# Patient Record
Sex: Male | Born: 1950
Health system: Southern US, Community
[De-identification: ages and names within clinical notes are randomized; demographics above are authoritative.]

## PROBLEM LIST (undated history)

## (undated) DIAGNOSIS — D123 Benign neoplasm of transverse colon: Secondary | ICD-10-CM

## (undated) DIAGNOSIS — T884XXA Failed or difficult intubation, initial encounter: Secondary | ICD-10-CM

## (undated) DIAGNOSIS — F419 Anxiety disorder, unspecified: Secondary | ICD-10-CM

## (undated) DIAGNOSIS — D125 Benign neoplasm of sigmoid colon: Secondary | ICD-10-CM

## (undated) DIAGNOSIS — D122 Benign neoplasm of ascending colon: Secondary | ICD-10-CM

## (undated) DIAGNOSIS — G250 Essential tremor: Secondary | ICD-10-CM

## (undated) DIAGNOSIS — G3184 Mild cognitive impairment, so stated: Secondary | ICD-10-CM

## (undated) DIAGNOSIS — C61 Malignant neoplasm of prostate: Secondary | ICD-10-CM

## (undated) DIAGNOSIS — B019 Varicella without complication: Secondary | ICD-10-CM

## (undated) DIAGNOSIS — D120 Benign neoplasm of cecum: Secondary | ICD-10-CM

## (undated) DIAGNOSIS — Z86010 Personal history of colonic polyps: Secondary | ICD-10-CM

## (undated) DIAGNOSIS — H269 Unspecified cataract: Secondary | ICD-10-CM

## (undated) HISTORY — PX: POLYPECTOMY: SHX149

## (undated) HISTORY — DX: Benign neoplasm of sigmoid colon: D12.5

## (undated) HISTORY — DX: Benign neoplasm of ascending colon: D12.2

## (undated) HISTORY — DX: Benign neoplasm of cecum: D12.0

## (undated) HISTORY — DX: Essential tremor: G25.0

## (undated) HISTORY — PX: COLONOSCOPY: SHX174

## (undated) HISTORY — DX: Personal history of colonic polyps: Z86.010

## (undated) HISTORY — PX: PROSTATE BIOPSY: SHX241

## (undated) HISTORY — DX: Benign neoplasm of transverse colon: D12.3

## (undated) HISTORY — PX: CLOSED REDUCTION FOREARM FRACTURE: SHX960

## (undated) HISTORY — DX: Unspecified cataract: H26.9

## (undated) HISTORY — DX: Mild cognitive impairment of uncertain or unknown etiology: G31.84

## (undated) HISTORY — DX: Varicella without complication: B01.9

## (undated) HISTORY — DX: Anxiety disorder, unspecified: F41.9

---

## 2001-10-28 ENCOUNTER — Encounter: Admission: RE | Admit: 2001-10-28 | Discharge: 2002-01-26 | Payer: Self-pay | Admitting: Family Medicine

## 2001-11-29 ENCOUNTER — Ambulatory Visit (HOSPITAL_BASED_OUTPATIENT_CLINIC_OR_DEPARTMENT_OTHER): Admission: RE | Admit: 2001-11-29 | Discharge: 2001-11-29 | Payer: Self-pay | Admitting: *Deleted

## 2007-10-08 ENCOUNTER — Ambulatory Visit: Payer: Self-pay | Admitting: Internal Medicine

## 2007-10-17 DIAGNOSIS — Z860100 Personal history of colon polyps, unspecified: Secondary | ICD-10-CM

## 2007-10-17 HISTORY — DX: Personal history of colonic polyps: Z86.010

## 2007-10-17 HISTORY — DX: Personal history of colon polyps, unspecified: Z86.0100

## 2007-10-22 ENCOUNTER — Ambulatory Visit: Payer: Self-pay | Admitting: Internal Medicine

## 2007-10-22 ENCOUNTER — Encounter: Payer: Self-pay | Admitting: Internal Medicine

## 2007-10-22 DIAGNOSIS — Z860100 Personal history of colon polyps, unspecified: Secondary | ICD-10-CM | POA: Insufficient documentation

## 2007-10-22 HISTORY — PX: COLONOSCOPY W/ POLYPECTOMY: SHX1380

## 2010-10-31 ENCOUNTER — Encounter: Payer: Self-pay | Admitting: Internal Medicine

## 2010-11-05 NOTE — Letter (Signed)
Summary: Colonoscopy Letter  [facility]  [address]   [place], [place] [address]   Phone: [phone]  Fax: [phone]      October 31, 2010 MRN: [id number]   [facility] [address] [place], [place]  [address]   Dear Mr. Rineer,   According to your medical record, it is time for you to schedule a Colonoscopy. The American Cancer Society recommends this procedure as a method to detect early colon cancer. Patients with a family history of colon cancer, or a personal history of colon polyps or inflammatory bowel disease are at increased risk.  This letter has been generated based on the recommendations made at the time of your procedure. If you feel that in your particular situation this may no longer apply, please contact our office.  Please call our office at [phone] to schedule this appointment or to update your records at your earliest convenience.  Thank you for cooperating with [place] to provide you with the very best care possible.   Sincerely,   Stan Head, M.D.  [facility] Gastroenterology Division [phone]

## 2010-11-06 ENCOUNTER — Encounter (INDEPENDENT_AMBULATORY_CARE_PROVIDER_SITE_OTHER): Payer: Self-pay | Admitting: *Deleted

## 2010-11-14 NOTE — Letter (Signed)
Summary: Pre Visit Letter Revised  [facility]  [address] [place], [place] [address]   Phone: [phone]  Fax: [phone]        11/06/2010 MRN: [id number] [facility] [address] [place], [place]  [address]             Procedure Date:  12-17-10           Recall Colon---Dr. Leone Payor   Welcome to the Gastroenterology Division at [facility].    You are scheduled to see a nurse for your pre-procedure visit on 12-03-10 at 8:30a.m. on the 3rd floor at [facility], [address]  We ask that you try to arrive at our office 15 minutes prior to your appointment time to allow for check-in.  Please take a minute to review the attached form.  If you answer "Yes" to one or more of the questions on the first page, we ask that you call the person listed at your earliest opportunity.  If you answer "No" to all of the questions, please complete the rest of the form and bring it to your appointment.    Your nurse visit will consist of discussing your medical and surgical history, your immediate family medical history, and your medications.   If you are unable to list all of your medications on the form, please bring the medication bottles to your appointment and we will list them.  We will need to be aware of both prescribed and over the counter drugs.  We will need to know exact dosage information as well.    Please be prepared to read and sign documents such as consent forms, a financial agreement, and acknowledgement forms.  If necessary, and with your consent, a friend or relative is welcome to sit-in on the nurse visit with you.  Please bring your insurance card so that we may make a copy of it.  If your insurance requires a referral to see a specialist, please bring your referral form from your primary care physician.  No co-pay is required for this nurse visit.     If you cannot keep your appointment, please call [phone] to cancel or reschedule prior to  your appointment date.  This allows [place] the opportunity to schedule an appointment for another patient in need of care.    Thank you for choosing [facility] for your medical needs.  We appreciate the opportunity to care for you.  Please visit [place] at our website  to learn more about our practice.  Sincerely, The Gastroenterology Division

## 2010-12-03 ENCOUNTER — Ambulatory Visit (AMBULATORY_SURGERY_CENTER): Payer: 59 | Admitting: *Deleted

## 2010-12-03 VITALS — Ht 70.5 in | Wt 193.0 lb

## 2010-12-03 DIAGNOSIS — Z86010 Personal history of colonic polyps: Secondary | ICD-10-CM

## 2010-12-03 MED ORDER — PEG-KCL-NACL-NASULF-NA ASC-C 100 G PO SOLR: ORAL | Status: DC

## 2010-12-17 ENCOUNTER — Other Ambulatory Visit: Payer: Self-pay | Admitting: Internal Medicine

## 2011-01-30 ENCOUNTER — Encounter: Payer: Self-pay | Admitting: Internal Medicine

## 2011-01-30 ENCOUNTER — Ambulatory Visit (AMBULATORY_SURGERY_CENTER): Payer: 59 | Admitting: Internal Medicine

## 2011-01-30 VITALS — BP 147/82 | HR 91 | Temp 98.0°F | Resp 20 | Ht 70.0 in | Wt 193.0 lb

## 2011-01-30 DIAGNOSIS — F411 Generalized anxiety disorder: Secondary | ICD-10-CM | POA: Insufficient documentation

## 2011-01-30 DIAGNOSIS — Z860100 Personal history of colon polyps, unspecified: Secondary | ICD-10-CM

## 2011-01-30 DIAGNOSIS — D126 Benign neoplasm of colon, unspecified: Secondary | ICD-10-CM

## 2011-01-30 DIAGNOSIS — Z1211 Encounter for screening for malignant neoplasm of colon: Secondary | ICD-10-CM

## 2011-01-30 DIAGNOSIS — F419 Anxiety disorder, unspecified: Secondary | ICD-10-CM

## 2011-01-30 DIAGNOSIS — F410 Panic disorder [episodic paroxysmal anxiety] without agoraphobia: Secondary | ICD-10-CM

## 2011-01-30 HISTORY — DX: Panic disorder (episodic paroxysmal anxiety): F41.0

## 2011-01-30 HISTORY — DX: Panic disorder (episodic paroxysmal anxiety): F41.1

## 2011-01-30 MED ORDER — SODIUM CHLORIDE 0.9 % IV SOLN: 500.0000 mL | INTRAVENOUS | Status: DC

## 2011-01-30 NOTE — Patient Instructions (Addendum)
One polyp was removed from your colon today.  Per Dr. Leone Payor your prostate was mildly enlarged and you should follow up with your primary care physician.  You will receive a letter in the mail in 1-2 weeks to give you pathology results of the polyp removed.  Resume your prior medications today.  Please call if any questions or concerns.

## 2011-01-30 NOTE — Progress Notes (Signed)
No complaints from pt noted in the recovery room.  MAW

## 2011-01-31 ENCOUNTER — Telehealth: Payer: Self-pay | Admitting: *Deleted

## 2011-01-31 NOTE — Telephone Encounter (Signed)
Left a message to call us.

## 2011-02-05 NOTE — Progress Notes (Signed)
Quick Note:  Small adenoma prior adenomas 2009 Routine repeat colonoscopy 01/2016 ______

## 2011-07-06 ENCOUNTER — Ambulatory Visit (HOSPITAL_COMMUNITY)
Admission: RE | Admit: 2011-07-06 | Discharge: 2011-07-06 | Disposition: A | Discharge status: Home / Self Care | Payer: Self-pay | Source: Ambulatory Visit | Attending: Emergency Medicine | Admitting: Emergency Medicine

## 2011-07-06 ENCOUNTER — Other Ambulatory Visit: Payer: Self-pay | Admitting: Emergency Medicine

## 2011-07-06 DIAGNOSIS — W19XXXA Unspecified fall, initial encounter: Secondary | ICD-10-CM

## 2011-07-06 DIAGNOSIS — I2510 Atherosclerotic heart disease of native coronary artery without angina pectoris: Secondary | ICD-10-CM | POA: Insufficient documentation

## 2011-07-06 DIAGNOSIS — R1012 Left upper quadrant pain: Secondary | ICD-10-CM | POA: Insufficient documentation

## 2011-07-06 LAB — CREATININE, SERUM
Creatinine, Ser: 0.83 mg/dL (ref 0.50–1.35)
GFR calc Af Amer: >90 mL/min (ref >90)
GFR calc non Af Amer: >90 mL/min (ref >90)

## 2011-07-06 LAB — BUN: BUN: 15 mg/dL (ref 6–23)

## 2011-07-06 MED ORDER — IOHEXOL 300 MG/ML  SOLN
100.0000 mL | Freq: Once | INTRAMUSCULAR | Status: AC | PRN
  Administered 2011-07-06: 100 mL via INTRAVENOUS

## 2011-10-11 ENCOUNTER — Ambulatory Visit (INDEPENDENT_AMBULATORY_CARE_PROVIDER_SITE_OTHER): Payer: BC Managed Care – PPO | Admitting: Family Medicine

## 2011-10-11 VITALS — BP 136/67 | HR 109 | Temp 98.7°F | Resp 20 | Ht 69.5 in | Wt 201.6 lb

## 2011-10-11 DIAGNOSIS — M79646 Pain in unspecified finger(s): Secondary | ICD-10-CM

## 2011-10-11 DIAGNOSIS — M79609 Pain in unspecified limb: Secondary | ICD-10-CM

## 2011-10-11 DIAGNOSIS — B079 Viral wart, unspecified: Secondary | ICD-10-CM

## 2011-10-11 NOTE — Progress Notes (Signed)
  Subjective:    Patient ID: Russell Sampson, male    DOB: 1950/11/11, 61 y.o.   MRN: [id number]  HPI 61 yo male with left thumb wart for a year.  Many years ago had same and was frozen and cut off.  Has come back.  Gets irritated some times.  Growing.  Also small one over 2nd metacarpal head palmar surface.    Review of Systems Negative except as per HPI     Objective:   Physical Exam  Constitutional: He appears well-developed and well-nourished.  Cardiovascular: Normal rate, normal heart sounds and intact distal pulses.   No murmur heard. Pulmonary/Chest: Effort normal and breath sounds normal.  left thumb with large (.5-.75 cm) irregular verrucous lesion.  ON 2ndMC head, small several mm, flat wart        Assessment & Plan:  Warts - treated per procedure note.

## 2011-10-11 NOTE — Progress Notes (Signed)
   Procedure Note for Cryotherapy:  Verbal consent obtained. Lesion along left thumb paired down with 10 blade. Two applications of cryotherapy applied to lesion. Two applications of cryotherapy also applied to small lesion at the base of the second digit left hand. Lesion dressed. Wound care covered.  RTC in 7-10 days for repeat treatment. Patient tolerated procedure well.  SignedEula Listen, PA-C 10/11/2011 6:36 PM

## 2011-10-19 ENCOUNTER — Ambulatory Visit (INDEPENDENT_AMBULATORY_CARE_PROVIDER_SITE_OTHER): Payer: BC Managed Care – PPO | Admitting: Physician Assistant

## 2011-10-19 DIAGNOSIS — B079 Viral wart, unspecified: Secondary | ICD-10-CM

## 2011-10-19 NOTE — Progress Notes (Signed)
  Subjective:    Patient ID: Russell Sampson, male    DOB: [birth year], 61 y.o.   MRN: [id number]  HPI Here for recheck of warts on Left hand S/P Liquid nitrogen treatment 1 week ago.    Review of Systems negative     Objective:   Physical Exam L hand-thumb with almost 1 cm wart over finger pad, and 5mm wart just proximal to 4th/5th MCP.     Assessment & Plan:  Warts-Left hand Refer to hand/ortho

## 2012-03-01 ENCOUNTER — Encounter: Payer: Self-pay | Admitting: Physician Assistant

## 2012-03-01 ENCOUNTER — Ambulatory Visit: Payer: BC Managed Care – PPO | Admitting: Physician Assistant

## 2012-03-01 VITALS — BP 142/82 | HR 82 | Temp 98.1°F | Resp 16 | Ht 69.25 in | Wt 197.8 lb

## 2012-03-01 DIAGNOSIS — Z0000 Encounter for general adult medical examination without abnormal findings: Secondary | ICD-10-CM

## 2012-03-01 LAB — COMPREHENSIVE METABOLIC PANEL
ALT: 19 U/L (ref 0–53)
AST: 18 U/L (ref 0–37)
Albumin: 4.4 g/dL (ref 3.5–5.2)
Alkaline Phosphatase: 71 U/L (ref 39–117)
BUN: 14 mg/dL (ref 6–23)
CO2: 26 mEq/L (ref 19–32)
Calcium: 9.7 mg/dL (ref 8.4–10.5)
Chloride: 104 mEq/L (ref 96–112)
Creat: 0.8 mg/dL (ref 0.50–1.35)
Glucose, Bld: 80 mg/dL (ref 70–99)
Potassium: 4.1 mEq/L (ref 3.5–5.3)
Sodium: 140 mEq/L (ref 135–145)
Total Bilirubin: 0.9 mg/dL (ref 0.3–1.2)
Total Protein: 6.9 g/dL (ref 6.0–8.3)

## 2012-03-01 LAB — CBC
HCT: 43.2 % (ref 39.0–52.0)
Hemoglobin: 14.9 g/dL (ref 13.0–17.0)
MCH: 28.4 pg (ref 26.0–34.0)
MCHC: 34.5 g/dL (ref 30.0–36.0)
MCV: 82.3 fL (ref 78.0–100.0)
Platelets: 280 10*3/uL (ref 150–400)
RBC: 5.25 MIL/uL (ref 4.22–5.81)
RDW: 13.2 % (ref 11.5–15.5)
WBC: 8.2 10*3/uL (ref 4.0–10.5)

## 2012-03-01 LAB — POCT UA - MICROSCOPIC ONLY
Casts, Ur, LPF, POC: NEGATIVE
Crystals, Ur, HPF, POC: NEGATIVE
Epithelial cells, urine per micros: NEGATIVE
Mucus, UA: POSITIVE
Sperm: POSITIVE
Yeast, UA: NEGATIVE

## 2012-03-01 LAB — LIPID PANEL
Cholesterol: 198 mg/dL (ref 0–200)
HDL: 40 mg/dL (ref >39)
LDL Cholesterol: 133 mg/dL — ABNORMAL HIGH (ref 0–99)
Total CHOL/HDL Ratio: 5 Ratio
Triglycerides: 126 mg/dL (ref <150)
VLDL: 25 mg/dL (ref 0–40)

## 2012-03-01 LAB — POCT URINALYSIS DIPSTICK
Bilirubin, UA: NEGATIVE
Blood, UA: NEGATIVE
Glucose, UA: NEGATIVE
Ketones, UA: NEGATIVE
Leukocytes, UA: NEGATIVE
Nitrite, UA: NEGATIVE
Protein, UA: NEGATIVE
Spec Grav, UA: 1.025
Urobilinogen, UA: 0.2
pH, UA: 6

## 2012-03-01 LAB — IFOBT (OCCULT BLOOD): IFOBT: NEGATIVE

## 2012-03-01 NOTE — Progress Notes (Signed)
Patient ID: Russell Sampson MRN: [id number], DOB: [birth year] 61 y.o. Date of Encounter: 03/01/2012, 7:29 PM  Primary Physician: No primary provider on file.  Chief Complaint: Physical (CPE)  HPI: 61 y.o. y/o male with history noted below here for CPE. Doing well. No issues/complaints. Anxiety is well controled with Celexa. Tolerating medication well without issues. Does not currently need a refill today.   Has gotten free prostate screenings through [facility] since age 61. As his dad was diagnosed with prostate cancer at age 52. Never with information that indicated carcinoma. Does not have any reports with him today, but states "everything has always been ok." Asymptomatic.  Had colonoscopy 2009, with removal of  3-4 polyps, all benign.  Vaccinations up to date.   See scanned in blue sheet.  Review of Systems: Consitutional: No fever, chills, fatigue, night sweats, lymphadenopathy, or weight changes. Eyes: No visual changes, eye redness, or discharge. ENT/Mouth: Ears: No otalgia, tinnitus, hearing loss, discharge. Nose: No congestion, rhinorrhea, sinus pain, or epistaxis. Throat: No sore throat, post nasal drip, or teeth pain. Cardiovascular: No CP, palpitations, diaphoresis, DOE, edema, orthopnea, PND. Respiratory: No cough, hemoptysis, SOB, or wheezing. Gastrointestinal: No anorexia, dysphagia, reflux, pain, nausea, vomiting, hematemesis, diarrhea, constipation, BRBPR, or melena. Genitourinary: No dysuria, frequency, urgency, hematuria, incontinence, nocturia, decreased urinary stream, discharge, impotence, or testicular pain/masses. Musculoskeletal: No decreased ROM, myalgias, stiffness, joint swelling, or weakness. Skin: No rash, erythema, lesion changes, pain, warmth, jaundice, or pruritis. Neurological: No headache, dizziness, syncope, seizures, tremors, memory loss, coordination problems, or paresthesias. Psychological: No anxiety, depression, hallucinations,  SI/HI. Endocrine: No fatigue, polydipsia, polyphagia, polyuria, or known diabetes. All other systems were reviewed and are otherwise negative.  Past Medical History  Diagnosis Date  . Anxiety     when he was out of work  . Personal history of colonic polyps 10/2007    3-4 small adenomas     Past Surgical History  Procedure Date  . Colonoscopy w/ polypectomy 10/22/2007    3-4 adenomas, max 6mm  . Closed reduction forearm fracture     1962    Home Meds:  Prior to Admission medications   Medication Sig Start Date End Date Taking? Authorizing Provider  citalopram (CELEXA) 20 MG tablet Take 20 mg by mouth daily.     Yes Historical Provider, MD  GRAPE SEED EXTRACT PO Take 1 capsule by mouth daily.     Yes Historical Provider, MD  Multiple Vitamins-Minerals (MULTIVITAMIN WITH MINERALS) tablet Take 1 tablet by mouth daily.     Yes Historical Provider, MD                  Allergies: Not on File  History   Social History  . Marital Status: Married    Spouse Name: N/A    Number of Children: N/A  . Years of Education: N/A   Occupational History  . Not on file.   Social History Main Topics  . Smoking status: Never Smoker   . Smokeless tobacco: Never Used  . Alcohol Use: No  . Drug Use: No  . Sexually Active: Yes -- Male partner(s)     Married   Other Topics Concern  . Not on file   Social History Narrative  . No narrative on file    Family History  Problem Relation Age of Onset  . Prostate cancer Father     Physical Exam: Blood pressure 142/82, pulse 82, temperature 98.1 F (36.7 C), temperature source Oral, resp. rate 16,  height 5' 9.25" (1.759 m), weight 197 lb 12.8 oz (89.721 kg).  General: Well developed, well nourished, in no acute distress. HEENT: Normocephalic, atraumatic. Conjunctiva pink, sclera non-icteric. Pupils 2 mm constricting to 1 mm, round, regular, and equally reactive to light and accomodation. EOMI. Internal auditory canal clear. TMs with good  cone of light and without pathology. Nasal mucosa pink. Nares are without discharge. No sinus tenderness. Oral mucosa pink. Dentition normal. Pharynx without exudate.   Neck: Supple. Trachea midline. No thyromegaly. Full ROM. No lymphadenopathy. Lungs: Clear to auscultation bilaterally without wheezes, rales, or rhonchi. Breathing is of normal effort and unlabored. Cardiovascular: RRR with S1 S2. No murmurs, rubs, or gallops appreciated. Distal pulses 2+ symmetrically. No carotid or abdominal bruits. Abdomen: Soft, non-tender, non-distended with normoactive bowel sounds. No hepatosplenomegaly or masses. No rebound/guarding. No CVA tenderness. Without hernias.  Rectal: No external hemorrhoids or fissures. Rectal vault without masses. Prostate smooth, symmetrical, and without enlargement.  Genitourinary: Circumcised male. No penile lesions. Testes descended bilaterally, and smooth without tenderness or masses.  Musculoskeletal: Full range of motion and 5/5 strength throughout. Without swelling, atrophy, tenderness, crepitus, or warmth. Extremities without clubbing, cyanosis, or edema. Calves supple. Skin: Warm and moist without erythema, ecchymosis, wounds, or rash. Neuro: A+Ox3. CN II-XII grossly intact. Moves all extremities spontaneously. Full sensation throughout. Normal gait. DTR 2+ throughout upper and lower extremities. Finger to nose intact. Psych:  Responds to questions appropriately with a normal affect.   Studies:  Results for orders placed in visit on 03/01/12  POCT UA - MICROSCOPIC ONLY      Component Value Range   WBC, Ur, HPF, POC 0-1     RBC, urine, microscopic 0-3     Bacteria, U Microscopic trace     Mucus, UA pos     Epithelial cells, urine per micros neg     Crystals, Ur, HPF, POC neg     Casts, Ur, LPF, POC neg     Yeast, UA neg     Sperm pos    POCT URINALYSIS DIPSTICK      Component Value Range   Color, UA yellow     Clarity, UA clear     Glucose, UA neg      Bilirubin, UA neg     Ketones, UA neg     Spec Grav, UA 1.025     Blood, UA neg     pH, UA 6.0     Protein, UA neg     Urobilinogen, UA 0.2     Nitrite, UA neg     Leukocytes, UA Negative    IFOBT (OCCULT BLOOD)      Component Value Range   IFOBT Negative     CBC, CMP, PSA, TSH, Lipid, Vitamin D all pending. Patient is not fasting.  Assessment/Plan:  61 y.o. y/o Caucasian male here for CPE. -Await labs, can leave detailed message -Follow up pending labs -Healthy diet and exercise -Anticipatory guidance -Up to date with vaccines -Up to date with colonoscopy, 2009  Signed, Charnese Federici, PA-C 03/01/2012 7:29 PM

## 2012-03-02 LAB — PSA: PSA: 1.65 ng/mL (ref <=4.00)

## 2012-03-02 LAB — TSH: TSH: 3.323 u[IU]/mL (ref 0.350–4.500)

## 2012-03-02 LAB — VITAMIN D 25 HYDROXY (VIT D DEFICIENCY, FRACTURES): Vit D, 25-Hydroxy: 51 ng/mL (ref 30–89)

## 2012-06-30 ENCOUNTER — Encounter: Payer: Self-pay | Admitting: Physician Assistant

## 2013-08-29 DIAGNOSIS — M25529 Pain in unspecified elbow: Secondary | ICD-10-CM | POA: Insufficient documentation

## 2013-08-29 HISTORY — DX: Pain in unspecified elbow: M25.529

## 2015-04-16 ENCOUNTER — Ambulatory Visit (INDEPENDENT_AMBULATORY_CARE_PROVIDER_SITE_OTHER): Payer: 59 | Admitting: Family

## 2015-04-16 ENCOUNTER — Encounter: Payer: Self-pay | Admitting: Family

## 2015-04-16 VITALS — BP 150/82 | HR 84 | Temp 98.5°F | Resp 18 | Ht 70.5 in | Wt 195.0 lb

## 2015-04-16 DIAGNOSIS — Z0000 Encounter for general adult medical examination without abnormal findings: Secondary | ICD-10-CM

## 2015-04-16 DIAGNOSIS — Z1211 Encounter for screening for malignant neoplasm of colon: Secondary | ICD-10-CM

## 2015-04-16 DIAGNOSIS — Z23 Encounter for immunization: Secondary | ICD-10-CM

## 2015-04-16 NOTE — Progress Notes (Signed)
Subjective:    Patient ID: Russell Sampson, male    DOB: Jul 31, 1951, 64 y.o.   MRN: [id number]  Chief Complaint  Patient presents with  . Establish Care    CPE, PSA check, not fasting, colonoscopy    HPI:  Russell Sampson is a 64 y.o. male who presents today for an annual wellness visit.   1) Health Maintenance -   Diet - Averages about 3 meals per day consisting of high fiber cereal, fruits, and vegetables with occasional meat. Denies caffeine.   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 Hepatitis C Screening  1950-10-02  . HIV Screening  02/27/1968  . TETANUS/TDAP  02/27/1972  . ZOSTAVAX  02/26/2013  . INFLUENZA VACCINE  03/19/2015  . COLONOSCOPY  01/30/2016    Immunization History  Administered Date(s) Administered  . Influenza Split 06/05/2012  . Zoster 04/16/2015    Review of Systems  Constitutional: Denies fever, chills, fatigue, or significant weight gain/loss. HENT: Head: Denies headache or neck pain Ears: Denies changes in hearing, ringing in ears, earache, drainage Nose: Denies discharge, stuffiness, itching, nosebleed, sinus pain Throat: Denies sore throat, hoarseness, dry mouth, sores, thrush Eyes: Denies loss/changes in vision, pain, redness, blurry/double vision, flashing lights Cardiovascular: Denies chest pain/discomfort, tightness, palpitations, shortness of breath with activity, difficulty lying down, swelling, sudden awakening with shortness of breath Respiratory: Denies shortness of breath, cough, sputum production, wheezing Gastrointestinal: Denies dysphasia, heartburn, change in appetite, nausea, change in bowel habits, rectal bleeding, constipation, diarrhea, yellow skin or eyes Genitourinary: Denies frequency, urgency, burning/pain, blood in urine, incontinence, change in urinary  strength. Musculoskeletal: Denies muscle/joint pain, stiffness, back pain, redness or swelling of joints, trauma Skin: Denies rashes, lumps, itching, dryness, color changes, or hair/nail changes Neurological: Denies dizziness, fainting, seizures, weakness, numbness, tingling, tremor Psychiatric - Denies nervousness, stress, depression or memory loss Endocrine: Denies heat or cold intolerance, sweating, frequent urination, excessive thirst, changes in appetite Hematologic: Denies ease of bruising or bleeding     Objective:     BP 150/82 mmHg  Pulse 84  Temp(Src) 98.5 F (36.9 C) (Oral)  Resp 18  Ht 5' 10.5" (1.791 m)  Wt 195 lb (88.451 kg)  BMI 27.57 kg/m2  SpO2 97% Nursing note and vital signs reviewed.  Physical Exam  Constitutional: He is oriented to person, place, and time. He appears well-developed and well-nourished.  HENT:  Head: Normocephalic.  Right Ear: Hearing, tympanic membrane, external ear and ear canal normal.  Left Ear: Hearing, tympanic membrane, external ear and ear canal normal.  Nose: Nose normal.  Mouth/Throat: Uvula is midline, oropharynx is clear and moist and mucous membranes are normal.  Eyes: Conjunctivae and EOM are normal. Pupils are equal, round, and reactive to light.  Neck: Neck supple. No JVD present. No tracheal deviation present. No thyromegaly present.  Cardiovascular: Normal rate, regular rhythm, normal heart sounds and intact distal pulses.   Pulmonary/Chest: Effort normal and breath sounds normal.  Abdominal: Soft. Bowel sounds are normal. He exhibits no distension and no mass. There is no tenderness. There is no rebound and no guarding.  Musculoskeletal: Normal range of motion. He exhibits no edema or tenderness.  Lymphadenopathy:    He has no cervical adenopathy.  Neurological: He is alert and oriented to person, place, and time. He has  normal reflexes. No cranial nerve deficit. He exhibits normal muscle tone. Coordination normal.  Skin:  Skin is warm and dry.  Approximately half centimeter annular dermal of light brown color with black specks noted on left side.  Psychiatric: He has a normal mood and affect. His behavior is normal. Judgment and thought content normal.       Assessment & Plan:   Problem List Items Addressed This Visit      Other   Routine general medical examination at a health care facility - Primary    1) Anticipatory Guidance: Discussed importance of wearing a seatbelt while driving and not texting while driving; changing batteries in smoke detector at least once annually; wearing suntan lotion when outside; eating a balanced and moderate diet; getting physical activity at least 30 minutes per day.  2) Immunizations / Screenings / Labs:  Influenza and Zostavax given today. Due for a tetanus shot at next office visit. All other immunizations are up-to-date per recommendations. Due for a PSA - obtain with lab work. Due for colonoscopy and skin exam - referrals to GI and dermatology placed. All other screenings are up-to-date per recommendations. Obtain CBC, BMET, Lipid profile and TSH.    Overall well exam with minimal risk factors for cardiovascular disease. Recommend fine-tuning nutrition to increase fruits and vegetables, otherwise continue current healthy lifestyle behaviors. Does have mildly increased isolated systolic blood pressure today. We'll continue to monitor. Follow-up office visit pending lab work and follow-up prevention exam in one year.      Relevant Orders   Comprehensive metabolic panel   CBC   Lipid panel   TSH   PSA   Ambulatory referral to Dermatology    Other Visit Diagnoses    Encounter for screening colonoscopy        Relevant Orders    Ambulatory referral to Gastroenterology    Need for shingles vaccine        Relevant Orders    Varicella-zoster vaccine subcutaneous (Completed)

## 2015-04-16 NOTE — Patient Instructions (Signed)
Thank you for choosing [facility].  Summary/Instructions:   Please stop by the lab on the basement level of the building for your blood work. Your results will be released to MyChart (or called to you) after review, usually within 72 hours after test completion. If any changes need to be made, you will be notified at that same time.  Health Maintenance A healthy lifestyle and preventative care can promote health and wellness.  Maintain regular health, dental, and eye exams.  Eat a healthy diet. Foods like vegetables, fruits, whole grains, low-fat dairy products, and lean protein foods contain the nutrients you need and are low in calories. Decrease your intake of foods high in solid fats, added sugars, and salt. Get information about a proper diet from your health care provider, if necessary.  Regular physical exercise is one of the most important things you can do for your health. Most adults should get at least 150 minutes of moderate-intensity exercise (any activity that increases your heart rate and causes you to sweat) each week. In addition, most adults need muscle-strengthening exercises on 2 or more days a week.   Maintain a healthy weight. The body mass index (BMI) is a screening tool to identify possible weight problems. It provides an estimate of body fat based on height and weight. Your health care provider can find your BMI and can help you achieve or maintain a healthy weight. For males 20 years and older:  A BMI below 18.5 is considered underweight.  A BMI of 18.5 to 24.9 is normal.  A BMI of 25 to 29.9 is considered overweight.  A BMI of 30 and above is considered obese.  Maintain normal blood lipids and cholesterol by exercising and minimizing your intake of saturated fat. Eat a balanced diet with plenty of fruits and vegetables. Blood tests for lipids and cholesterol should begin at age 20 and be repeated every 5 years. If your lipid or cholesterol levels are  high, you are over age 50, or you are at high risk for heart disease, you may need your cholesterol levels checked more frequently.Ongoing high lipid and cholesterol levels should be treated with medicines if diet and exercise are not working.  If you smoke, find out from your health care provider how to quit. If you do not use tobacco, do not start.  Lung cancer screening is recommended for adults aged 55-80 years who are at high risk for developing lung cancer because of a history of smoking. A yearly low-dose CT scan of the lungs is recommended for people who have at least a 30-pack-year history of smoking and are current smokers or have quit within the past 15 years. A pack year of smoking is smoking an average of 1 pack of cigarettes a day for 1 year (for example, a 30-pack-year history of smoking could mean smoking 1 pack a day for 30 years or 2 packs a day for 15 years). Yearly screening should continue until the smoker has stopped smoking for at least 15 years. Yearly screening should be stopped for people who develop a health problem that would prevent them from having lung cancer treatment.  If you choose to drink alcohol, do not have more than 2 drinks per day. One drink is considered to be 12 oz (360 mL) of beer, 5 oz (150 mL) of wine, or 1.5 oz (45 mL) of liquor.  Avoid the use of street drugs. Do not share needles with anyone. Ask for help if you need   support or instructions about stopping the use of drugs.  High blood pressure causes heart disease and increases the risk of stroke. Blood pressure should be checked at least every 1-2 years. Ongoing high blood pressure should be treated with medicines if weight loss and exercise are not effective.  If you are 45-79 years old, ask your health care provider if you should take aspirin to prevent heart disease.  Diabetes screening involves taking a blood sample to check your fasting blood sugar level. This should be done once every 3 years  after age 45 if you are at a normal weight and without risk factors for diabetes. Testing should be considered at a younger age or be carried out more frequently if you are overweight and have at least 1 risk factor for diabetes.  Colorectal cancer can be detected and often prevented. Most routine colorectal cancer screening begins at the age of 50 and continues through age 75. However, your health care provider may recommend screening at an earlier age if you have risk factors for colon cancer. On a yearly basis, your health care provider may provide home test kits to check for hidden blood in the stool. A small camera at the end of a tube may be used to directly examine the colon (sigmoidoscopy or colonoscopy) to detect the earliest forms of colorectal cancer. Talk to your health care provider about this at age 50 when routine screening begins. A direct exam of the colon should be repeated every 5-10 years through age 75, unless early forms of precancerous polyps or small growths are found.  People who are at an increased risk for hepatitis B should be screened for this virus. You are considered at high risk for hepatitis B if:  You were born in a country where hepatitis B occurs often. Talk with your health care provider about which countries are considered high risk.  Your parents were born in a high-risk country and you have not received a shot to protect against hepatitis B (hepatitis B vaccine).  You have HIV or AIDS.  You use needles to inject street drugs.  You live with, or have sex with, someone who has hepatitis B.  You are a man who has sex with other men (MSM).  You get hemodialysis treatment.  You take certain medicines for conditions like cancer, organ transplantation, and autoimmune conditions.  Hepatitis C blood testing is recommended for all people born from 1945 through 1965 and any individual with known risk factors for hepatitis C.  Healthy men should no longer receive  prostate-specific antigen (PSA) blood tests as part of routine cancer screening. Talk to your health care provider about prostate cancer screening.  Testicular cancer screening is not recommended for adolescents or adult males who have no symptoms. Screening includes self-exam, a health care provider exam, and other screening tests. Consult with your health care provider about any symptoms you have or any concerns you have about testicular cancer.  Practice safe sex. Use condoms and avoid high-risk sexual practices to reduce the spread of sexually transmitted infections (STIs).  You should be screened for STIs, including gonorrhea and chlamydia if:  You are sexually active and are younger than 24 years.  You are older than 24 years, and your health care provider tells you that you are at risk for this type of infection.  Your sexual activity has changed since you were last screened, and you are at an increased risk for chlamydia or gonorrhea. Ask your health care   provider if you are at risk.  If you are at risk of being infected with HIV, it is recommended that you take a prescription medicine daily to prevent HIV infection. This is called pre-exposure prophylaxis (PrEP). You are considered at risk if:  You are a man who has sex with other men (MSM).  You are a heterosexual man who is sexually active with multiple partners.  You take drugs by injection.  You are sexually active with a partner who has HIV.  Talk with your health care provider about whether you are at high risk of being infected with HIV. If you choose to begin PrEP, you should first be tested for HIV. You should then be tested every 3 months for as long as you are taking PrEP.  Use sunscreen. Apply sunscreen liberally and repeatedly throughout the day. You should seek shade when your shadow is shorter than you. Protect yourself by wearing long sleeves, pants, a wide-brimmed hat, and sunglasses year round whenever you are  outdoors.  Tell your health care provider of new moles or changes in moles, especially if there is a change in shape or color. Also, tell your health care provider if a mole is larger than the size of a pencil eraser.  A one-time screening for abdominal aortic aneurysm (AAA) and surgical repair of large AAAs by ultrasound is recommended for men aged 65-75 years who are current or former smokers.  Stay current with your vaccines (immunizations). Document Released: 01/31/2008 Document Revised: 08/09/2013 Document Reviewed: 12/30/2010 ExitCare Patient Information 2015 [employer]. This information is not intended to replace advice given to you by your health care provider. Make sure you discuss any questions you have with your health care provider.   

## 2015-04-16 NOTE — Assessment & Plan Note (Addendum)
1) Anticipatory Guidance: Discussed importance of wearing a seatbelt while driving and not texting while driving; changing batteries in smoke detector at least once annually; wearing suntan lotion when outside; eating a balanced and moderate diet; getting physical activity at least 30 minutes per day.  2) Immunizations / Screenings / Labs:  Influenza and Zostavax given today. Due for a tetanus shot at next office visit. All other immunizations are up-to-date per recommendations. Due for a PSA - obtain with lab work. Due for colonoscopy and skin exam - referrals to GI and dermatology placed. All other screenings are up-to-date per recommendations. Obtain CBC, BMET, Lipid profile and TSH.    Overall well exam with minimal risk factors for cardiovascular disease. Recommend fine-tuning nutrition to increase fruits and vegetables, otherwise continue current healthy lifestyle behaviors. Does have mildly increased isolated systolic blood pressure today. We'll continue to monitor. Follow-up office visit pending lab work and follow-up prevention exam in one year.

## 2015-04-16 NOTE — Progress Notes (Signed)
Pre visit review using our clinic review tool, if applicable. No additional management support is needed unless otherwise documented below in the visit note. 

## 2015-04-17 ENCOUNTER — Telehealth: Payer: Self-pay | Admitting: Family

## 2015-04-17 ENCOUNTER — Other Ambulatory Visit (INDEPENDENT_AMBULATORY_CARE_PROVIDER_SITE_OTHER): Payer: 59

## 2015-04-17 DIAGNOSIS — Z0000 Encounter for general adult medical examination without abnormal findings: Secondary | ICD-10-CM

## 2015-04-17 LAB — LIPID PANEL
Cholesterol: 170 mg/dL (ref 0–200)
HDL: 41.4 mg/dL (ref >39.00)
LDL Cholesterol: 110 mg/dL — ABNORMAL HIGH (ref 0–99)
NonHDL: 128.68
Total CHOL/HDL Ratio: 4
Triglycerides: 92 mg/dL (ref 0.0–149.0)
VLDL: 18.4 mg/dL (ref 0.0–40.0)

## 2015-04-17 LAB — PSA: PSA: 3.32 ng/mL (ref 0.10–4.00)

## 2015-04-17 LAB — COMPREHENSIVE METABOLIC PANEL
ALT: 17 U/L (ref 0–53)
AST: 15 U/L (ref 0–37)
Albumin: 4 g/dL (ref 3.5–5.2)
Alkaline Phosphatase: 85 U/L (ref 39–117)
BUN: 14 mg/dL (ref 6–23)
CO2: 29 mEq/L (ref 19–32)
Calcium: 9 mg/dL (ref 8.4–10.5)
Chloride: 105 mEq/L (ref 96–112)
Creatinine, Ser: 0.76 mg/dL (ref 0.40–1.50)
GFR: 110.41 mL/min (ref >60.00)
Glucose, Bld: 90 mg/dL (ref 70–99)
Potassium: 4.3 mEq/L (ref 3.5–5.1)
Sodium: 140 mEq/L (ref 135–145)
Total Bilirubin: 0.8 mg/dL (ref 0.2–1.2)
Total Protein: 6.5 g/dL (ref 6.0–8.3)

## 2015-04-17 LAB — CBC
HCT: 44.2 % (ref 39.0–52.0)
Hemoglobin: 14.9 g/dL (ref 13.0–17.0)
MCHC: 33.7 g/dL (ref 30.0–36.0)
MCV: 83.2 fl (ref 78.0–100.0)
Platelets: 266 10*3/uL (ref 150.0–400.0)
RBC: 5.31 Mil/uL (ref 4.22–5.81)
RDW: 13.2 % (ref 11.5–15.5)
WBC: 8.3 10*3/uL (ref 4.0–10.5)

## 2015-04-17 LAB — TSH: TSH: 3.17 u[IU]/mL (ref 0.35–4.50)

## 2015-04-17 NOTE — Telephone Encounter (Signed)
Please inform patient that his blood work shows that his kidney function, liver function, electrolytes, prostate, cholesterol, thyroid function and white/red blood cells are within the normal limits. Therefore no further actions are needed at this time and he can follow up for a physical in 1 year. He should be hearing back from dermatology and gastroenterology within the next couple of weeks.

## 2015-04-18 ENCOUNTER — Ambulatory Visit: Payer: 59

## 2015-04-18 NOTE — Telephone Encounter (Signed)
LVM for pt to call back.

## 2015-04-19 NOTE — Telephone Encounter (Signed)
Pt aware of results 

## 2015-05-21 ENCOUNTER — Ambulatory Visit: Payer: 59

## 2015-06-26 ENCOUNTER — Encounter: Payer: Self-pay | Admitting: Internal Medicine

## 2016-02-04 ENCOUNTER — Encounter: Payer: Self-pay | Admitting: Gastroenterology

## 2016-02-07 ENCOUNTER — Encounter: Payer: Self-pay | Admitting: Internal Medicine

## 2016-03-28 ENCOUNTER — Ambulatory Visit (AMBULATORY_SURGERY_CENTER): Payer: Self-pay

## 2016-03-28 ENCOUNTER — Encounter: Payer: Self-pay | Admitting: Internal Medicine

## 2016-03-28 VITALS — Ht 70.5 in | Wt 185.8 lb

## 2016-03-28 DIAGNOSIS — Z860100 Personal history of colon polyps, unspecified: Secondary | ICD-10-CM

## 2016-03-28 DIAGNOSIS — Z1211 Encounter for screening for malignant neoplasm of colon: Secondary | ICD-10-CM

## 2016-03-28 NOTE — Progress Notes (Signed)
No allergies to eggs or soy No past problems with anesthesia No diet meds No home oxygen  Has email and internet; declined emmi 

## 2016-04-08 ENCOUNTER — Encounter: Payer: Self-pay | Admitting: Internal Medicine

## 2016-04-08 ENCOUNTER — Ambulatory Visit (AMBULATORY_SURGERY_CENTER): Payer: BLUE CROSS/BLUE SHIELD | Admitting: Internal Medicine

## 2016-04-08 VITALS — BP 115/73 | HR 68 | Temp 97.3°F | Resp 17 | Ht 70.5 in | Wt 195.0 lb

## 2016-04-08 DIAGNOSIS — D120 Benign neoplasm of cecum: Secondary | ICD-10-CM | POA: Diagnosis not present

## 2016-04-08 DIAGNOSIS — Z86010 Personal history of colonic polyps: Secondary | ICD-10-CM

## 2016-04-08 DIAGNOSIS — D124 Benign neoplasm of descending colon: Secondary | ICD-10-CM

## 2016-04-08 DIAGNOSIS — D123 Benign neoplasm of transverse colon: Secondary | ICD-10-CM | POA: Diagnosis not present

## 2016-04-08 MED ORDER — SODIUM CHLORIDE 0.9 % IV SOLN: 500.0000 mL | INTRAVENOUS | Status: DC

## 2016-04-08 NOTE — Patient Instructions (Addendum)
   I removed 3 small polyps today. I will let you know pathology results and when to have another routine colonoscopy by mail.  I appreciate the opportunity to care for you. Gatha Mayer, MD, FACG   YOU HAD AN ENDOSCOPIC PROCEDURE TODAY AT [facility]:   Refer to the procedure report that was given to you for any specific questions about what was found during the examination.  If the procedure report does not answer your questions, please call your gastroenterologist to clarify.  If you requested that your care partner not be given the details of your procedure findings, then the procedure report has been included in a sealed envelope for you to review at your convenience later.  YOU SHOULD EXPECT: Some feelings of bloating in the abdomen. Passage of more gas than usual.  Walking can help get rid of the air that was put into your GI tract during the procedure and reduce the bloating. If you had a lower endoscopy (such as a colonoscopy or flexible sigmoidoscopy) you may notice spotting of blood in your stool or on the toilet paper. If you underwent a bowel prep for your procedure, you may not have a normal bowel movement for a few days.  Please Note:  You might notice some irritation and congestion in your nose or some drainage.  This is from the oxygen used during your procedure.  There is no need for concern and it should clear up in a day or so.  SYMPTOMS TO REPORT IMMEDIATELY:   Following lower endoscopy (colonoscopy or flexible sigmoidoscopy):  Excessive amounts of blood in the stool  Significant tenderness or worsening of abdominal pains  Swelling of the abdomen that is new, acute  Fever of 100F or higher    For urgent or emergent issues, a gastroenterologist can be reached at any hour by calling [phone].   DIET:  We do recommend a small meal at first, but then you may proceed to your regular diet.  Drink plenty of fluids but you should avoid  alcoholic beverages for 24 hours.  ACTIVITY:  You should plan to take it easy for the rest of today and you should NOT DRIVE or use heavy machinery until tomorrow (because of the sedation medicines used during the test).    FOLLOW UP: Our staff will call the number listed on your records the next business day following your procedure to check on you and address any questions or concerns that you may have regarding the information given to you following your procedure. If we do not reach you, we will leave a message.  However, if you are feeling well and you are not experiencing any problems, there is no need to return our call.  We will assume that you have returned to your regular daily activities without incident.  If any biopsies were taken you will be contacted by phone or by letter within the next 1-3 weeks.  Please call us at [phone] if you have not heard about the biopsies in 3 weeks.    SIGNATURES/CONFIDENTIALITY: You and/or your care partner have signed paperwork which will be entered into your electronic medical record.  These signatures attest to the fact that that the information above on your After Visit Summary has been reviewed and is understood.  Full responsibility of the confidentiality of this discharge information lies with you and/or your care-partner.   Information on polyps given to you today   Await pathology results

## 2016-04-08 NOTE — Progress Notes (Signed)
Called to room to assist during endoscopic procedure.  Patient ID and intended procedure confirmed with present staff. Received instructions for my participation in the procedure from the performing physician.  

## 2016-04-08 NOTE — Progress Notes (Signed)
Patient awakening,vss,report to rn 

## 2016-04-08 NOTE — Op Note (Signed)
[facility] Patient Name: Russell Sampson Procedure Date: 04/08/2016 10:06 AM MRN: [id number] Endoscopist: Gatha Mayer , MD Age: 65 Referring MD:  Date of Birth: June 13, 1951 Gender: Male Account #: [id number] Procedure:                Colonoscopy Indications:              High risk colon cancer surveillance: Personal                            history of multiple (3 or more) adenomas Medicines:                Propofol per Anesthesia, Monitored Anesthesia Care Procedure:                Pre-Anesthesia Assessment:                           - Prior to the procedure, a History and Physical                            was performed, and patient medications and                            allergies were reviewed. The patient's tolerance of                            previous anesthesia was also reviewed. The risks                            and benefits of the procedure and the sedation                            options and risks were discussed with the patient.                            All questions were answered, and informed consent                            was obtained. Prior Anticoagulants: The patient has                            taken no previous anticoagulant or antiplatelet                            agents. ASA Grade Assessment: II - A patient with                            mild systemic disease. After reviewing the risks                            and benefits, the patient was deemed in                            satisfactory condition to undergo the procedure.  After obtaining informed consent, the colonoscope                            was passed under direct vision. Throughout the                            procedure, the patient's blood pressure, pulse, and                            oxygen saturations were monitored continuously. The                            Model CF-HQ190L [id number]) scope was introduced   through the anus and advanced to the the cecum,                            identified by appendiceal orifice and ileocecal                            valve. The colonoscopy was performed without                            difficulty. The patient tolerated the procedure                            well. The quality of the bowel preparation was                            good. The bowel preparation used was Miralax. The                            ileocecal valve, appendiceal orifice, and rectum                            were photographed. Scope In: 10:23:27 AM Scope Out: 10:39:28 AM Scope Withdrawal Time: 0 hours 13 minutes 23 seconds  Total Procedure Duration: 0 hours 16 minutes 1 second  Findings:                 The perianal and digital rectal examinations were                            normal. Pertinent negatives include normal prostate                            (size, shape, and consistency).                           Two sessile polyps were found in the transverse                            colon and cecum. The polyps were 2 to 3 mm in size.                            These polyps  were removed with a cold biopsy                            forceps. Resection and retrieval were complete.                            Verification of patient identification for the                            specimen was done. Estimated blood loss was minimal.                           A 7 mm polyp was found in the descending colon. The                            polyp was sessile. The polyp was removed with a                            cold snare. Resection and retrieval were complete.                            Verification of patient identification for the                            specimen was done. Estimated blood loss was minimal.                           The exam was otherwise without abnormality on                            direct and retroflexion views. Complications:            No immediate  complications. Estimated Blood Loss:     Estimated blood loss was minimal. Impression:               - Two 2 to 3 mm polyps in the transverse colon and                            in the cecum, removed with a cold biopsy forceps.                            Resected and retrieved.                           - One 6 mm polyp in the descending colon, removed                            with a cold snare. Resected and retrieved.                           - The examination was otherwise normal on direct                            and retroflexion  views. Recommendation:           - Patient has a contact number available for                            emergencies. The signs and symptoms of potential                            delayed complications were discussed with the                            patient. Return to normal activities tomorrow.                            Written discharge instructions were provided to the                            patient.                           - Resume previous diet.                           - Continue present medications.                           - Repeat colonoscopy is recommended for                            surveillance. The colonoscopy date will be                            determined after pathology results from today's                            exam become available for review. Gatha Mayer, MD 04/08/2016 10:45:50 AM This report has been signed electronically.

## 2016-04-09 ENCOUNTER — Telehealth: Payer: Self-pay | Admitting: *Deleted

## 2016-04-09 NOTE — Telephone Encounter (Signed)
  Follow up Call-  Call back number 04/08/2016  Post procedure Call Back phone  # [phone] or [phone]  Permission to leave phone message Yes  Some recent data might be hidden     Patient questions:  Do you have a fever, pain , or abdominal swelling? No. Pain Score  0 *  Have you tolerated food without any problems? Yes.    Have you been able to return to your normal activities? Yes.    Do you have any questions about your discharge instructions: Diet   No. Medications  No. Follow up visit  No.  Do you have questions or concerns about your Care? No.  Actions: * If pain score is 4 or above: No action needed, pain <4.

## 2016-04-10 ENCOUNTER — Encounter: Payer: Self-pay | Admitting: Internal Medicine

## 2016-04-14 ENCOUNTER — Encounter: Payer: Self-pay | Admitting: Internal Medicine

## 2016-04-14 DIAGNOSIS — Z86010 Personal history of colonic polyps: Secondary | ICD-10-CM

## 2016-04-14 NOTE — Progress Notes (Signed)
3 adenomas max 6 mm Recall 2020

## 2016-10-02 ENCOUNTER — Telehealth: Payer: Self-pay | Admitting: Medical Oncology

## 2016-10-02 NOTE — Progress Notes (Signed)
I called pt to introduce myself as the Prostate Nurse Navigator and the Coordinator of the [facility].  1. I confirmed with the patient he is aware of his referral to the clinic 10/14/16 arriving at 12:30pm.  2. I discussed the format of the clinic and the physicians he will be seeing that day. I asked him to have lunch     before his arrival due to the length of the clinic.   3. I discussed where the clinic is located and how to contact me.I informed him that we have [place] parking.  4. I confirmed his address and informed him I would be mailing a packet of information and forms to be completed. I asked him to bring them with him the day of his appointment.   He voiced understanding of the above. I asked him to call me if he has any questions or concerns regarding his appointments or the forms he needs to complete.  He is very nervous and discussed that his father passed away in his sixties from prostate cancer. He states he is so thankful for the free prostate clinic and feel confident that he is in good hands.

## 2016-10-10 ENCOUNTER — Encounter: Payer: Self-pay | Admitting: Medical Oncology

## 2016-10-13 ENCOUNTER — Telehealth: Payer: Self-pay | Admitting: Medical Oncology

## 2016-10-13 DIAGNOSIS — C61 Malignant neoplasm of prostate: Secondary | ICD-10-CM | POA: Insufficient documentation

## 2016-10-13 NOTE — Progress Notes (Signed)
Requested pathology slides from [facility] 

## 2016-10-13 NOTE — Progress Notes (Signed)
Confirmed appointment for [facility] 10/14/16 arriving at 12:30pm. I reminded him to bring his completed medical forms, have lunch and reviewed the registration process. All questions were answered and asked him to call me back if has other concerns.

## 2016-10-14 ENCOUNTER — Ambulatory Visit (HOSPITAL_BASED_OUTPATIENT_CLINIC_OR_DEPARTMENT_OTHER): Payer: BLUE CROSS/BLUE SHIELD | Admitting: Oncology

## 2016-10-14 ENCOUNTER — Encounter: Payer: Self-pay | Admitting: Medical Oncology

## 2016-10-14 ENCOUNTER — Ambulatory Visit
Admission: RE | Admit: 2016-10-14 | Discharge: 2016-10-14 | Disposition: A | Discharge status: Home / Self Care | Payer: BLUE CROSS/BLUE SHIELD | Source: Ambulatory Visit | Attending: Radiation Oncology | Admitting: Radiation Oncology

## 2016-10-14 ENCOUNTER — Encounter: Payer: Self-pay | Admitting: Radiation Oncology

## 2016-10-14 DIAGNOSIS — Z8042 Family history of malignant neoplasm of prostate: Secondary | ICD-10-CM

## 2016-10-14 DIAGNOSIS — C61 Malignant neoplasm of prostate: Secondary | ICD-10-CM

## 2016-10-14 HISTORY — DX: Malignant neoplasm of prostate: C61

## 2016-10-14 NOTE — Progress Notes (Signed)
                               Care Plan Summary  Name: Russell Sampson DOB: 1950-09-26   Your Medical Team:   Urologist -  Dr. Raynelle Bring, [facility]  Radiation Oncologist - Dr. Tyler Pita, [facility]   Medical Oncologist - Dr. Zola Button, [place]  Recommendations: 1) Robotic Prostatectomy  2) Brachytherapy (seed implant)  3) Radiation  * These recommendations are based on information available as of today's consult.      Recommendations may change depending on the results of further tests or exams.  Next Steps: 1) Consider your options and call Cira Rue, RN with      decision   When appointments need to be scheduled, you will be contacted by [facility] and/or [facility].  Questions?  Please do not hesitate to call Cira Rue, RN, BSN, OCN at [phone] any questions or concerns.  Shirlean Mylar is your Oncology Nurse Navigator and is available to assist you while you're receiving your medical care at [facility].  Pt was given a folder with business cards for all team members and [place] handout.

## 2016-10-14 NOTE — Consult Note (Signed)
[facility]     10/14/2016   --------------------------------------------------------------------------------   Russell Sampson  MRN: [id number]  PRIMARY CARE:    DOB: November 27, 1950, 66 year old Male  REFERRING:    SSN: [id number]  PROVIDER:  Raynelle Bring, M.D.    LOCATION:  [facility] [phone]   --------------------------------------------------------------------------------   CC/HPI: CC: Prostate Cancer   PCP:  Location of consult: [facility]   Russell Sampson is a 66 year old gentleman with a strong family history of prostate cancer who was noted to have an increased in his PSA from a baseline of 1.65 up to 3.9. He underwent a TRUS biopsy of the prostate on 09/19/16 that confirmed Gleason 3+4=7 adenocarcinoma of the prostate with 6 out of 12 biopsy cores positive for malignancy.   Family history: His father died of metastatic prostate cancer in his 4s. This is a source of severe anxiety for Russell Sampson.   Imaging studies: None.   PMH: He has a history of no major medical problems.  PSH: No abdominal surgeries.   TNM stage: cT1c Nx Mx  PSA: 3.9  Gleason score: 3+4=7  Biopsy (09/19/16): 6/12 cores positive  Left: L lateral mid (10%, 3+4=7), L mid (70%, 3+4=7), L lateral base (60%, 3+4=7, PNI)  Right: R apex (5%, 3+3=6), R lateral mid (90%, 3+3=6), R lateral base (20%, 3+3=6)  Prostate volume: 42 cc   Nomogram  OC disease: 41%  EPE: 58%  SVI: 5%  LNI: 4%  PFS (5 year, 10 year): 89%,82%   Urinary function: IPSS is 2.  Erectile function: SHIM score is 23. However, he is not sexually active and has not been for the last 3-4 years due to his wife's health issues.     ALLERGIES: None   MEDICATIONS: Diazepam 10 mg tablet Take one tablet 30-60 minutes prior to procedure  Airborne Vitamin C  Multivitamin  Vitamin B Complex     GU PSH: Prostate Needle Biopsy - 09/19/2016    NON-GU PSH: Surgical  Pathology, Gross And Microscopic Examination For Prostate Needle - 09/19/2016    GU PMH: None   NON-GU PMH: Anxiety    FAMILY HISTORY: Dementia - Mother Prostate Cancer - Father   SOCIAL HISTORY: Marital Status: Married Current Smoking Status: Patient has never smoked.   Tobacco Use Assessment Completed: Used Tobacco in last 30 days? Has never drank.  Drinks 1 caffeinated drink per day.    REVIEW OF SYSTEMS:    GU Review Male:   Patient denies frequent urination, hard to postpone urination, burning/ pain with urination, get up at night to urinate, leakage of urine, stream starts and stops, trouble starting your streams, and have to strain to urinate .  Gastrointestinal (Upper):   Patient denies nausea and vomiting.  Gastrointestinal (Lower):   Patient denies diarrhea and constipation.  Constitutional:   Patient denies fever, night sweats, weight loss, and fatigue.  Skin:   Patient denies skin rash/ lesion and itching.  Eyes:   Patient denies double vision and blurred vision.  Ears/ Nose/ Throat:   Patient denies sore throat and sinus problems.  Hematologic/Lymphatic:   Patient denies swollen glands and easy bruising.  Cardiovascular:   Patient denies leg swelling and chest pains.  Respiratory:   Patient denies cough and shortness of breath.  Endocrine:   Patient denies excessive thirst.  Musculoskeletal:   Patient denies back pain and joint pain.  Neurological:   Patient denies  headaches and dizziness.  Psychologic:   Patient denies depression and anxiety.   VITAL SIGNS: None   MULTI-SYSTEM PHYSICAL EXAMINATION:    Constitutional: Well-nourished. No physical deformities. Normally developed. Good grooming.  Respiratory: No labored breathing, no use of accessory muscles. Clear bilaterally.  Cardiovascular: Normal temperature, normal extremity pulses, no swelling, no varicosities. Regular rate and rhythm.  Gastrointestinal: No mass, no tenderness, no rigidity, non obese abdomen.      PAST DATA REVIEWED:  Source Of History:  Patient  Lab Test Review:   PSA  Records Review:   Pathology Reports, Previous Patient Records  Urine Test Review:   Urinalysis   PROCEDURES: None   ASSESSMENT:      ICD-10 Details  1 GU:   Prostate Cancer - C61    PLAN:           Document Letter(s):  Created for Patient: Clinical Summary         Notes:   1. Prostate cancer: I had a detailed discussion with Russell Sampson and his wife today regarding his prostate cancer diagnosis.   The patient was counseled about the natural history of prostate cancer and the standard treatment options that are available for prostate cancer. It was explained to him how his age and life expectancy, clinical stage, Gleason score, and PSA affect his prognosis, the decision to proceed with additional staging studies, as well as how that information influences recommended treatment strategies. We discussed the roles for active surveillance, radiation therapy, surgical therapy, androgen deprivation, as well as ablative therapy options for the treatment of prostate cancer as appropriate to his individual cancer situation. We discussed the risks and benefits of these options with regard to their impact on cancer control and also in terms of potential adverse events, complications, and impact on quality of life particularly related to urinary and sexual function. The patient was encouraged to ask questions throughout the discussion today and all questions were answered to his stated satisfaction. In addition, the patient was provided with and/or directed to appropriate resources and literature for further education about prostate cancer and treatment options.   We discussed surgical therapy for prostate cancer including the different available surgical approaches. We discussed, in detail, the risks and expectations of surgery with regard to cancer control, urinary control, and erectile function as well as the expected  postoperative recovery process. Additional risks of surgery including but not limited to bleeding, infection, hernia formation, nerve damage, lymphocele formation, bowel/rectal injury potentially necessitating colostomy, damage to the urinary tract resulting in urine leakage, urethral stricture, and the cardiopulmonary risks such as myocardial infarction, stroke, death, venothromboembolism, etc. were explained. The risk of open surgical conversion for robotic/laparoscopic prostatectomy was also discussed.   He appears to be leaning towards surgical therapy although would like to meet with Dr. Tammi Klippel this afternoon and discuss his radiotherapy options as well. If he does proceed with surgical therapy, my plan will be to perform a bilateral nerve sparing robot-assisted laparoscopic radical prostatectomy and pelvic lymphadenectomy.   Cc: Dr. Tyler Pita  Dr. Zola Button          E & M CODE: I spent at least 40 minutes face to face with the patient, more than 50% of that time was spent on counseling and/or coordinating care.

## 2016-10-14 NOTE — Progress Notes (Signed)
Prostate - No Medical Intervention - Off Treatment.  Patient Characteristics: Adenocarcinoma, Clinically Localized Disease, Intermediate Risk, Surgical Candidate AJCC T Stage: 1c AJCC Stage Grouping: IIA Current radiographic evidence of distant metastasis? No PSA: < 10 Gleason Primary: 3 Gleason Secondary: 4 Gleason Score: 7 AJCC M Stage: 0 AJCC N Stage: 0 Risk Status: Intermediate Risk Treatment: Surgical Candidate

## 2016-10-14 NOTE — Progress Notes (Signed)
GU Location of Tumor / Histology: prostatic adenocarcinoma  August 2016  3.32 September 2017 4.5  If Prostate Cancer, Gleason Score is (3 + 4) and PSA is (3.9) as of November 2017. Prostate volume: 42 grams.  Russell Sampson was recently seen by Dr. Alinda Money in the prostate cancer screening at [facility].  Biopsies of prostate (if applicable) revealed:    Past/Anticipated interventions by urology, if any: biopsy, referral to [facility]  Past/Anticipated interventions by medical oncology, if any: to be evaluated by Dr. Alen Blew during [facility]   Weight changes, if any: no  Bowel/Bladder complaints, if any: nocturia   Nausea/Vomiting, if any: no  Pain issues, if any:  no  SAFETY ISSUES:  Prior radiation? no  Pacemaker/ICD? no  Possible current pregnancy? no  Is the patient on methotrexate? no  Current Complaints / other details:  66 year old male. Married. Father passed from metastatic prostate cancer. Hx of anxiety.

## 2016-10-14 NOTE — Progress Notes (Signed)
Radiation Oncology         [phone]) [phone] ________________________________  Initial outpatient Consultation  Name: Russell Sampson MRN: [id number]  Date: 10/14/2016  DOB: Jun 02, 1951  [id number], Belenda Cruise, FNP  Raynelle Bring, MD   REFERRING PHYSICIAN: Raynelle Bring, MD  DIAGNOSIS: 66 y.o. gentleman with Stage T1c adenocarcinoma of the prostate with Gleason Score of 3+4, and PSA of 4.5    ICD-9-CM ICD-10-CM   1. Prostate cancer [facility]) 185 C61     HISTORY OF PRESENT ILLNESS: Russell Sampson is a 66 y.o. male with a diagnosis of prostate cancer. He was noted to have an elevated PSA of 3.32 at a [facility] Prostate cancer screening clinic with Dr. Alinda Money in 03/2015. PSA was further elevated at 4.5 with repeat screening 05/05/16 and a follow up PSA in 06/2016 was 3.9.  Accordingly, he was referred for evaluation in urology by Dr. Alinda Money on 08/27/16,  digital rectal examination was performed at that time revealing normal exam without induration or nodularity.  The patient proceeded to transrectal ultrasound with 12 biopsies of the prostate on 09/19/16.  The prostate volume measured 42 cc.  Out of 12 core biopsies, 6 were positive.  The maximum Gleason score was 3+4=7, and this was seen in the left mid gland, left mid lateral and left base lateral.  He has a strong family history of prostate cancer- his father died at age 81 with metastatic prostate cancer.  The patient reviewed the biopsy results with his urologist and he has kindly been referred to multidisciplinary prostate clinic today for discussion of potential treatment options.     PREVIOUS RADIATION THERAPY: No  PAST MEDICAL HISTORY:  Past Medical History:  Diagnosis Date  . Anxiety    when he was out of work  . Chicken pox   . Personal history of colonic polyps 10/2007   3-4 small adenomas  . Prostate cancer ([place])       PAST SURGICAL HISTORY: Past Surgical History:  Procedure Laterality Date  . CLOSED REDUCTION FOREARM FRACTURE     1962  . COLONOSCOPY W/ POLYPECTOMY  10/22/2007   3-4 adenomas, max 61mm    FAMILY HISTORY:  Family History  Problem Relation Age of Onset  . Prostate cancer Father   . Alzheimer's disease Mother   . Stroke Mother   . Colon cancer Neg Hx     SOCIAL HISTORY:  Social History   Social History  . Marital status: Married    Spouse name: N/A  . Number of children: N/A  . Years of education: 34   Occupational History  . Retired    Social History Main Topics  . Smoking status: Never Smoker  . Smokeless tobacco: Never Used  . Alcohol use No  . Drug use: No  . Sexual activity: Yes    Partners: Female     Comment: Married   Other Topics Concern  . Not on file   Social History Narrative   Fun:     ALLERGIES: Patient has no known allergies.  MEDICATIONS:  Current Outpatient Prescriptions  Medication Sig Dispense Refill  . b complex vitamins tablet Take 1 tablet by mouth daily.    . Multiple Vitamins-Minerals (MULTIVITAMIN WITH MINERALS) tablet Take 1 tablet by mouth daily.      . Multiple Vitamins-Minerals (ZINC) LOZG Take by mouth.    Marland Kitchen OVER THE COUNTER MEDICATION AIRBORNE    . vitamin B-12 (CYANOCOBALAMIN) 500 MCG tablet Take by mouth.     Current  Facility-Administered Medications  Medication Dose Route Frequency Provider Last Rate Last Dose  . 0.9 %  sodium chloride infusion  500 mL Intravenous Continuous Gatha Mayer, MD        REVIEW OF SYSTEMS:  On review of systems, the patient reports that he is doing well overall. He denies any chest pain, shortness of breath, cough, fevers, chills, night sweats, unintended weight changes. He denies any bowel disturbances, and denies abdominal pain, nausea or vomiting. He denies any new musculoskeletal or joint aches or pains. His IPSS was 2, indicating mild urinary symptoms including, nocturia x 1 and frequency. He is able to complete sexual activity with most attempts. A complete review of systems is obtained and is otherwise  negative.    PHYSICAL EXAM:  Wt Readings from Last 3 Encounters:  10/14/16 192 lb 3.2 oz (87.2 kg)  04/08/16 195 lb (88.5 kg)  03/28/16 185 lb 12.8 oz (84.3 kg)   Temp Readings from Last 3 Encounters:  10/14/16 97.7 F (36.5 C) (Oral)  04/08/16 97.3 F (36.3 C) (Other (Comment))  04/16/15 98.5 F (36.9 C) (Oral)   BP Readings from Last 3 Encounters:  10/14/16 (!) 152/79  04/08/16 115/73  04/16/15 (!) 150/82   Pulse Readings from Last 3 Encounters:  10/14/16 97  04/08/16 68  04/16/15 84   Pain Assessment Pain Score: 0-No pain/10  In general this is a well appearing caucasian male in no acute distress. He is alert and oriented x4 and appropriate throughout the examination. HEENT reveals that the patient is normocephalic, atraumatic. EOMs are intact. PERRLA. Skin is intact without any evidence of gross lesions. Cardiovascular exam reveals a regular rate and rhythm, no clicks rubs or murmurs are auscultated. Chest is clear to auscultation bilaterally. Lymphatic assessment is performed and does not reveal any adenopathy in the cervical, supraclavicular, axillary, or inguinal chains. Abdomen has active bowel sounds in all quadrants and is intact. The abdomen is soft, non tender, non distended. Lower extremities are negative for pretibial pitting edema, deep calf tenderness, cyanosis or clubbing.   KPS = 100  100 - Normal; no complaints; no evidence of disease. 90   - Able to carry on normal activity; minor signs or symptoms of disease. 80   - Normal activity with effort; some signs or symptoms of disease. 20   - Cares for self; unable to carry on normal activity or to do active work. 60   - Requires occasional assistance, but is able to care for most of his personal needs. 50   - Requires considerable assistance and frequent medical care. 26   - Disabled; requires special care and assistance. 50   - Severely disabled; hospital admission is indicated although death not  imminent. 98   - Very sick; hospital admission necessary; active supportive treatment necessary. 10   - Moribund; fatal processes progressing rapidly. 0     - Dead  Karnofsky DA, [place], Craver LS and [facility] [phone]) The use of the nitrogen mustards in the palliative treatment of carcinoma: with particular reference to bronchogenic carcinoma Cancer 1 [id number]  LABORATORY DATA:  Lab Results  Component Value Date   WBC 8.3 04/17/2015   HGB 14.9 04/17/2015   HCT 44.2 04/17/2015   MCV 83.2 04/17/2015   PLT 266.0 04/17/2015   Lab Results  Component Value Date   NA 140 04/17/2015   K 4.3 04/17/2015   CL 105 04/17/2015   CO2 29 04/17/2015   Lab Results  Component  Value Date   ALT 17 04/17/2015   AST 15 04/17/2015   ALKPHOS 85 04/17/2015   BILITOT 0.8 04/17/2015     RADIOGRAPHY: No results found.    IMPRESSION/PLAN: 1. 66 y.o. gentleman with Stage T1c adenocarcinoma of the prostate with Gleason Score of 3+4, and PSA of 4.5. Today, we talked to the patient and family about the findings and work-up thus far.  We discussed the natural history of prostate cancer and general treatment, highlighting the role of radiotherapy in the management.  We discussed the available radiation techniques, including external beam radiation and brachytherapy and focused on the details of logistics and delivery.  We reviewed the anticipated acute and late sequelae associated with radiation in this setting.  We compared and contrasted the options of EBRT with brachytherapy as well as surgical prostatectomy. The patient was encouraged to ask questions that we answered to the best of our ability.   At present, the patient is leaning towards proceeding with surgical treatment with prostatectomy.  He will call to confirm his treatment preference within the next week.  We will share our findings with Dr. Alinda Money.  We enjoyed meeting this nice gentleman and would be happy to further participate in his care. He  knows that we welcome any further questions or concerns that he may have regarding radiotherapy.  Nicholos Johns, PA-C    &    Tyler Pita, MD [place] Director and Director of Stereotactic Radiosurgery Direct Dial: [phone]  Fax: [phone] [email or web address]  Skype  [email or web address]     This document serves as a record of services personally performed by Tyler Pita, MD and Freeman Caldron, PA-C. It was created on their behalf by Arlyce Harman, a trained medical scribe. The creation of this record is based on the scribe's personal observations and the provider's statements to them. This document has been checked and approved by the attending provider.

## 2016-10-14 NOTE — Progress Notes (Signed)
Reason for Referral: Prostate cancer.   HPI: Russell Sampson is a pleasant 66 year old gentleman currently of [place] where he lives majority of his life. He is a gentleman without any significant comorbid conditions but does have a family history of prostate cancer. His father died of complications related to advanced prostate cancer over 30 years ago. He is very diligent about obtaining prostate cancer screening and was noted to have an increase in his PSA from 1.65 to 3.32 in August 2016. A repeat PSA in November 2017 was 3.9. This on these findings, he underwent a prostate biopsy by Dr. Alinda Money on 09/19/2016. At that time he was found to have a Gleason score 3+4 = 7 in 3 cores. An prostate cancer 3+3 = 6 in 3 other cores. Based on these findings, he was referred to the prostate cancer multidisciplinary clinic for discussion. Clinically, he reports no symptoms. He is very anxious about his diagnosis but does not report any frequency, urgency or nocturia. He denied any incontinence. He remains active and attends to activities of daily living.  He does not report any headaches, blurry vision, syncope or seizures. He does not report any fevers, chills or sweats. His appetite remains excellent. He does not report any chest pain, palpitation, orthopnea or leg edema. He does not report any cough, wheezing or hemoptysis. He does not report any nausea, vomiting or abdominal pain. He does not report any constipation or diarrhea. He does not report any skeletal complaints. Remaining review of system is unremarkable.   Past Medical History:  Diagnosis Date  . Anxiety    when he was out of work  . Chicken pox   . Personal history of colonic polyps 10/2007   3-4 small adenomas  . Prostate cancer [facility])   :  Past Surgical History:  Procedure Laterality Date  . CLOSED REDUCTION FOREARM FRACTURE     1962  . COLONOSCOPY W/ POLYPECTOMY  10/22/2007   3-4 adenomas, max 16mm  :   Current Outpatient Prescriptions:   .  b complex vitamins tablet, Take 1 tablet by mouth daily., Disp: , Rfl:  .  Multiple Vitamins-Minerals (AIRBORNE PO), Take by mouth., Disp: , Rfl:  .  Multiple Vitamins-Minerals (MULTIVITAMIN WITH MINERALS) tablet, Take 1 tablet by mouth daily.  , Disp: , Rfl:  .  vitamin B-12 (CYANOCOBALAMIN) 500 MCG tablet, Take by mouth., Disp: , Rfl:   Current Facility-Administered Medications:  .  0.9 %  sodium chloride infusion, 500 mL, Intravenous, Continuous, Gatha Mayer, MD:  No Known Allergies:  Family History  Problem Relation Age of Onset  . Prostate cancer Father   . Alzheimer's disease Mother   . Stroke Mother   . Colon cancer Neg Hx   :  Social History   Social History  . Marital status: Married    Spouse name: N/A  . Number of children: N/A  . Years of education: 17   Occupational History  . Retired    Social History Main Topics  . Smoking status: Never Smoker  . Smokeless tobacco: Never Used  . Alcohol use No  . Drug use: No  . Sexual activity: Yes    Partners: Female     Comment: Married   Other Topics Concern  . Not on file   Social History Narrative   Fun:   :  Pertinent items are noted in HPI.  Exam: ECOG 0.  General appearance: alert and cooperative appeared without distress. Throat: lips, mucosa, and tongue normal; teeth  and gums normal Neck: no neck masses or thyromegaly. Back: negative Resp: clear to auscultation bilaterally Cardio: regular rate and rhythm, S1, S2 normal, no murmur, click, rub or gallop GI: soft, non-tender; bowel sounds normal; no masses,  no organomegaly Extremities: extremities normal, atraumatic, no cyanosis or edema Skin: Skin color, texture, turgor normal. No rashes or lesions  CBC    Component Value Date/Time   WBC 8.3 04/17/2015 0840   RBC 5.31 04/17/2015 0840   HGB 14.9 04/17/2015 0840   HCT 44.2 04/17/2015 0840   PLT 266.0 04/17/2015 0840   MCV 83.2 04/17/2015 0840   MCH 28.4 03/01/2012 1341   MCHC 33.7  04/17/2015 0840   RDW 13.2 04/17/2015 0840     Chemistry      Component Value Date/Time   NA 140 04/17/2015 0840   K 4.3 04/17/2015 0840   CL 105 04/17/2015 0840   CO2 29 04/17/2015 0840   BUN 14 04/17/2015 0840   CREATININE 0.76 04/17/2015 0840   CREATININE 0.80 03/01/2012 1341      Component Value Date/Time   CALCIUM 9.0 04/17/2015 0840   ALKPHOS 85 04/17/2015 0840   AST 15 04/17/2015 0840   ALT 17 04/17/2015 0840   BILITOT 0.8 04/17/2015 0840       Assessment and Plan:   66 year old gentleman with prostate cancer diagnosed in February 2018. His PSA was 3.9 and a Gleason score 3+4 = 7. His case was discussed today and the prostate cancer multidisciplinary clinic including review with the reviewing pathologist.  The natural course of this disease was discussed with the patient today especially in the setting of a family history of prostate cancer. Fortunately, his disease has been detected and an early-stage although he does have an intermediate risk prostate cancer that warrants treatment. I do not believe that observation and surveillance is an option at this time given the fact that he does have increased risk of metastasis and further spread. These options would be primary surgical therapy versus radiation therapy and short course of androgen deprivation. Risks and benefits of both approaches were discussed today and I feel he is a reasonable candidate for both options. I believe both options could be given an excellent chance of disease control with long-term.  We have discussed the role of systemic therapy in case he develops advanced disease I assured him that multiple options do exist which is different than the case was for his father over 30 years ago.  All his questions were answered today and he'll make his decision based on his discussion today with Dr. Alinda Money and Dr. Tammi Klippel.

## 2016-10-17 ENCOUNTER — Ambulatory Visit (INDEPENDENT_AMBULATORY_CARE_PROVIDER_SITE_OTHER): Payer: BLUE CROSS/BLUE SHIELD | Admitting: Family

## 2016-10-17 ENCOUNTER — Encounter: Payer: Self-pay | Admitting: General Practice

## 2016-10-17 ENCOUNTER — Encounter: Payer: Self-pay | Admitting: Family

## 2016-10-17 VITALS — BP 108/80 | HR 78 | Temp 98.3°F | Resp 16 | Ht 70.0 in | Wt 192.0 lb

## 2016-10-17 DIAGNOSIS — F419 Anxiety disorder, unspecified: Secondary | ICD-10-CM

## 2016-10-17 MED ORDER — CITALOPRAM HYDROBROMIDE 10 MG PO TABS: 10.0000 mg | ORAL_TABLET | Freq: Every day | ORAL | 1 refills | Status: DC

## 2016-10-17 NOTE — Assessment & Plan Note (Signed)
Symptoms of increasing anxiety secondary to recent diagnosis of prostate cancer with scheduled upcoming surgery. Discussed importance of stress and stress management techniques. Start Celexa. Follow up in 1 month or sooner if needed.

## 2016-10-17 NOTE — Patient Instructions (Signed)
Thank you for choosing [facility].  SUMMARY AND INSTRUCTIONS:  Please start taking the Celexa.  Follow up in about 1 month to determine effectiveness.  Medication:  Your prescription(s) have been submitted to your pharmacy or been printed and provided for you. Please take as directed and contact our office if you believe you are having problem(s) with the medication(s) or have any questions.  Follow up:  If your symptoms worsen or fail to improve, please contact our office for further instruction, or in case of emergency go directly to the emergency room at the closest medical facility.     Stress and Stress Management Stress is a normal reaction to life events. It is what you feel when life demands more than you are used to or more than you can handle. Some stress can be useful. For example, the stress reaction can help you catch the last bus of the day, study for a test, or meet a deadline at work. But stress that occurs too often or for too long can cause problems. It can affect your emotional health and interfere with relationships and normal daily activities. Too much stress can weaken your immune system and increase your risk for physical illness. If you already have a medical problem, stress can make it worse. What are the causes? All sorts of life events may cause stress. An event that causes stress for one person may not be stressful for another person. Major life events commonly cause stress. These may be positive or negative. Examples include losing your job, moving into a new home, getting married, having a baby, or losing a loved one. Less obvious life events may also cause stress, especially if they occur day after day or in combination. Examples include working long hours, driving in traffic, caring for children, being in debt, or being in a difficult relationship. What are the signs or symptoms? Stress may cause emotional symptoms including, the following:  Anxiety.  This is feeling worried, afraid, on edge, overwhelmed, or out of control.  Anger. This is feeling irritated or impatient.  Depression. This is feeling sad, down, helpless, or guilty.  Difficulty focusing, remembering, or making decisions. Stress may cause physical symptoms, including the following:  Aches and pains. These may affect your head, neck, back, stomach, or other areas of your body.  Tight muscles or clenched jaw.  Low energy or trouble sleeping. Stress may cause unhealthy behaviors, including the following:  Eating to feel better (overeating) or skipping meals.  Sleeping too little, too much, or both.  Working too much or putting off tasks (procrastination).  Smoking, drinking alcohol, or using drugs to feel better. How is this diagnosed? Stress is diagnosed through an assessment by your health care provider. Your health care provider will ask questions about your symptoms and any stressful life events.Your health care provider will also ask about your medical history and may order blood tests or other tests. Certain medical conditions and medicine can cause physical symptoms similar to stress. Mental illness can cause emotional symptoms and unhealthy behaviors similar to stress. Your health care provider may refer you to a mental health professional for further evaluation. How is this treated? Stress management is the recommended treatment for stress.The goals of stress management are reducing stressful life events and coping with stress in healthy ways. Techniques for reducing stressful life events include the following:  Stress identification. Self-monitor for stress and identify what causes stress for you. These skills may help you to avoid some stressful  events.  Time management. Set your priorities, keep a calendar of events, and learn to say "no." These tools can help you avoid making too many commitments. Techniques for coping with stress include the  following:  Rethinking the problem. Try to think realistically about stressful events rather than ignoring them or overreacting. Try to find the positives in a stressful situation rather than focusing on the negatives.  Exercise. Physical exercise can release both physical and emotional tension. The key is to find a form of exercise you enjoy and do it regularly.  Relaxation techniques. These relax the body and mind. Examples include yoga, meditation, tai chi, biofeedback, deep breathing, progressive muscle relaxation, listening to music, being out in nature, journaling, and other hobbies. Again, the key is to find one or more that you enjoy and can do regularly.  Healthy lifestyle. Eat a balanced diet, get plenty of sleep, and do not smoke. Avoid using alcohol or drugs to relax.  Strong support network. Spend time with family, friends, or other people you enjoy being around.Express your feelings and talk things over with someone you trust. Counseling or talktherapy with a mental health professional may be helpful if you are having difficulty managing stress on your own. Medicine is typically not recommended for the treatment of stress.Talk to your health care provider if you think you need medicine for symptoms of stress. Follow these instructions at home:  Keep all follow-up visits as directed by your health care provider.  Take all medicines as directed by your health care provider. Contact a health care provider if:  Your symptoms get worse or you start having new symptoms.  You feel overwhelmed by your problems and can no longer manage them on your own. Get help right away if:  You feel like hurting yourself or someone else. This information is not intended to replace advice given to you by your health care provider. Make sure you discuss any questions you have with your health care provider. Document Released: 01/28/2001 Document Revised: 01/10/2016 Document Reviewed:  03/29/2013 Elsevier Interactive Patient Education  2017 [employer].

## 2016-10-17 NOTE — Progress Notes (Signed)
Subjective:    Patient ID: Russell Sampson, male    DOB: 1951/05/28, 66 y.o.   MRN: [id number]  Chief Complaint  Patient presents with  . medication discussion    has to have prostate surgery, wants to go back on celexa due to severe anxiety and nervousness    HPI:  Russell Sampson is a 66 y.o. male who  has a past medical history of Anxiety; Chicken pox; Personal history of colonic polyps (10/2007); and Prostate cancer ([place]). and presents today for a follow up office visit.  Anxiety - Not currently maintained on medication. Has increased levels of anxiety secondary to recent scheduled prostate surgery. Has previously been on celexa in the past which has helped. Current stress relief includes working in the yard and working on cars. Denies any current counseling. Does have some trouble concentrating and listening. The levels of increased of anxiety have been going on for several months.    No Known Allergies   Outpatient Medications Prior to Visit  Medication Sig Dispense Refill  . b complex vitamins tablet Take 1 tablet by mouth daily.    . Multiple Vitamins-Minerals (AIRBORNE PO) Take by mouth.    . Multiple Vitamins-Minerals (MULTIVITAMIN WITH MINERALS) tablet Take 1 tablet by mouth daily.      . vitamin B-12 (CYANOCOBALAMIN) 500 MCG tablet Take by mouth.     Facility-Administered Medications Prior to Visit  Medication Dose Route Frequency Provider Last Rate Last Dose  . 0.9 %  sodium chloride infusion  500 mL Intravenous Continuous Gatha Mayer, MD          Review of Systems  Constitutional: Negative for chills and fever.  Respiratory: Negative for chest tightness and shortness of breath.   Cardiovascular: Negative for chest pain, palpitations and leg swelling.  Psychiatric/Behavioral: Negative for dysphoric mood, sleep disturbance and suicidal ideas. The patient is nervous/anxious.       Objective:    BP 108/80 (BP Location: Left Arm, Patient Position: Sitting, Cuff  Size: Large)   Pulse 78   Temp 98.3 F (36.8 C) (Oral)   Resp 16   Ht 5\' 10"  (1.778 m)   Wt 192 lb (87.1 kg)   SpO2 98%   BMI 27.55 kg/m  Nursing note and vital signs reviewed.  Physical Exam  Constitutional: He is oriented to person, place, and time. He appears well-developed and well-nourished. No distress.  Cardiovascular: Normal rate, regular rhythm, normal heart sounds and intact distal pulses.   Pulmonary/Chest: Effort normal and breath sounds normal.  Neurological: He is alert and oriented to person, place, and time.  Skin: Skin is warm and dry.  Psychiatric: His behavior is normal. Judgment and thought content normal. His mood appears anxious.       Assessment & Plan:   Problem List Items Addressed This Visit      Other   Anxiety - Primary    Symptoms of increasing anxiety secondary to recent diagnosis of prostate cancer with scheduled upcoming surgery. Discussed importance of stress and stress management techniques. Start Celexa. Follow up in 1 month or sooner if needed.       Relevant Medications   citalopram (CELEXA) 10 MG tablet       I am having Mr. Barentine start on citalopram. I am also having him maintain his multivitamin with minerals, b complex vitamins, vitamin B-12, and Multiple Vitamins-Minerals (AIRBORNE PO). We will continue to administer sodium chloride.   Meds ordered this encounter  Medications  .  citalopram (CELEXA) 10 MG tablet    Sig: Take 1 tablet (10 mg total) by mouth daily.    Dispense:  30 tablet    Refill:  1    Order Specific Question:   Supervising Provider    Answer:   Pricilla Holm A [id number]     Follow-up: Return in about 1 month (around 11/17/2016).  Mauricio Po, FNP

## 2016-10-17 NOTE — Progress Notes (Signed)
[place] Psychosocial Distress Screening Spiritual Care  LVM for [facility] following Pro[facility] Multidisciplinary [facility] to introduce [place] team/resources, review, distress screen per protocol, and assess for distress/other psychosocial needs..  The patient scored a 10 on the Psychosocial Distress Thermometer which indicates severe distress.   ONCBCN DISTRESS SCREENING 10/17/2016  Screening Type Initial Screening  Distress experienced in past week (1-10) 10  Emotional problem type Nervousness/Anxiety;Adjusting to illness  Information Concerns Type Lack of info about treatment   Per Nurse Navigator Jabier Gauss, Mr Mccauslin's father died of prostate cancer at 87, when pt was 49.  This compounds pt's anxiety about his own dx.  I see that his FNP has prescribed a return to Celexa to help with his anxiety.      Follow up needed: No.  I LVM encouraging a return call, but want to be cautious about triggering any more anxiety.  He has full packet of [facility] team/resource information, and Support Team is available at any point for additional care.  Please inbasket with a particular referral and/or page if immediate needs arise.  Thank you.   [place], [place], [facility] Pager [phone] Voicemail [phone]

## 2016-10-20 ENCOUNTER — Telehealth: Payer: Self-pay | Admitting: Medical Oncology

## 2016-10-20 ENCOUNTER — Encounter: Payer: Self-pay | Admitting: Medical Oncology

## 2016-10-20 NOTE — Progress Notes (Signed)
I emailed Dr. Alinda Money to inform him [name] would like to proceed with robotic prostatectomy.

## 2016-10-20 NOTE — Progress Notes (Addendum)
Mr. Wagy called stating that he is worried about not being able to get his wedding ring off for surgery. He is worried about swelling post op and does not want it to cut into his flesh. If needed he will go to a jeweler and have it cut off and resized. I told him he could have some swelling post op but could not be certain. He asked if I would get Dr. Lynne Logan opinion and give him a call back. He also informed me he he started taking Celexa again to help his anxiety. He states that his anxiety has decreased after he was seen in the clinic and made the decision to have surgery.

## 2016-10-23 ENCOUNTER — Other Ambulatory Visit: Payer: Self-pay | Admitting: Urology

## 2016-10-23 MED ORDER — FLEET ENEMA 7-19 GM/118ML RE ENEM: 1.0000 | ENEMA | Freq: Once | RECTAL | Status: DC

## 2016-10-23 MED ORDER — MAGNESIUM CITRATE PO SOLN: 1.0000 | Freq: Once | ORAL | Status: DC

## 2016-10-29 ENCOUNTER — Encounter: Payer: Self-pay | Admitting: *Deleted

## 2016-10-31 NOTE — Patient Instructions (Addendum)
Russell Sampson  10/31/2016   Your procedure is scheduled on: Monday 11/10/16  Report to [facility] Main  Entrance take [place]  elevators to 3rd floor to  [place] at 0830 AM.  Call this number if you have problems the morning of surgery [phone]   Remember: ONLY 1 PERSON MAY GO WITH YOU TO SHORT STAY TO GET  READY MORNING OF [place].              FOLLOW BOWEL PREP INSTRUCTIONS FROM DR. BORDEN'S OFFICE ON Sunday 11/09/2016  ALONG WITH A  CLEAR LIQUID DIET ALL DAY TIL MIDNIGHT!     CLEAR LIQUID DIET   Foods Allowed                                                                     Foods Excluded  Coffee and tea, regular and decaf                             liquids that you cannot  Plain Jell-O in any flavor                                             see through such as: Fruit ices (not with fruit pulp)                                     milk, soups, orange juice  Iced Popsicles                                    All solid food Carbonated beverages, regular and diet                                    Cranberry, grape and apple juices Sports drinks like Gatorade Lightly seasoned clear broth or consume(fat free) Sugar, honey syrup  Sample Menu Breakfast                                Lunch                                     Supper Cranberry juice                    Beef broth                            Chicken broth Jell-O  Grape juice                           Apple juice Coffee or tea                        Jell-O                                      Popsicle                                                Coffee or tea                        Coffee or tea  _____________________________________________________________________               DO NOT EAT ANY FOODS OR DRINK LIQUIDS : AFTER MIDNIGHT     Take these medicines the morning of surgery with A SIP OF WATER: Celexa              You may not  have any metal on your body including hair pins and              piercings  Do not wear jewelry, make-up, lotions, powders or perfumes, deodorant             Do not wear nail polish.  Do not shave  48 hours prior to surgery.              Men may shave face and neck.   Do not bring valuables to the hospital. [place].  Contacts, dentures or bridgework may not be worn into surgery.  Leave suitcase in the car. After surgery it may be brought to your room.                  Please read over the following fact sheets you were given: _____________________________________________________________________         [facility] - Preparing for Surgery Before surgery, you can play an important role.  Because skin is not sterile, your skin needs to be as free of germs as possible.  You can reduce the number of germs on your skin by washing with CHG (chlorahexidine gluconate) soap before surgery.  CHG is an antiseptic cleaner which kills germs and bonds with the skin to continue killing germs even after washing. Please DO NOT use if you have an allergy to CHG or antibacterial soaps.  If your skin becomes reddened/irritated stop using the CHG and inform your nurse when you arrive at Short Stay. Do not shave (including legs and underarms) for at least 48 hours prior to the first CHG shower.  You may shave your face/neck. Please follow these instructions carefully:  1.  Shower with CHG Soap the night before surgery and the  morning of Surgery.  2.  If you choose to wash your hair, wash your hair first as usual with your  normal  shampoo.  3.  After you shampoo, rinse your hair and body thoroughly to remove the  shampoo.  4.  Use CHG as you would any other liquid soap.  You can apply chg directly  to the skin and wash                       Gently with a scrungie or clean washcloth.  5.  Apply the CHG Soap to your body ONLY FROM THE NECK  DOWN.   Do not use on face/ open                           Wound or open sores. Avoid contact with eyes, ears mouth and genitals (private parts).                       Wash face,  Genitals (private parts) with your normal soap.             6.  Wash thoroughly, paying special attention to the area where your surgery  will be performed.  7.  Thoroughly rinse your body with warm water from the neck down.  8.  DO NOT shower/wash with your normal soap after using and rinsing off  the CHG Soap.                9.  Pat yourself dry with a clean towel.            10.  Wear clean pajamas.            11.  Place clean sheets on your bed the night of your first shower and do not  sleep with pets. Day of Surgery : Do not apply any lotions/deodorants the morning of surgery.  Please wear clean clothes to the hospital/surgery center.  FAILURE TO FOLLOW THESE INSTRUCTIONS MAY RESULT IN THE CANCELLATION OF YOUR SURGERY PATIENT SIGNATURE_________________________________  NURSE SIGNATURE__________________________________  ________________________________________________________________________   Russell Sampson  An incentive spirometer is a tool that can help keep your lungs clear and active. This tool measures how well you are filling your lungs with each breath. Taking long deep breaths may help reverse or decrease the chance of developing breathing (pulmonary) problems (especially infection) following:  A long period of time when you are unable to move or be active. BEFORE THE PROCEDURE   If the spirometer includes an indicator to show your best effort, your nurse or respiratory therapist will set it to a desired goal.  If possible, sit up straight or lean slightly forward. Try not to slouch.  Hold the incentive spirometer in an upright position. INSTRUCTIONS FOR USE  1. Sit on the edge of your bed if possible, or sit up as far as you can in bed or on a chair. 2. Hold the incentive spirometer in  an upright position. 3. Breathe out normally. 4. Place the mouthpiece in your mouth and seal your lips tightly around it. 5. Breathe in slowly and as deeply as possible, raising the piston or the ball toward the top of the column. 6. Hold your breath for 3-5 seconds or for as long as possible. Allow the piston or ball to fall to the bottom of the column. 7. Remove the mouthpiece from your mouth and breathe out normally. 8. Rest for a few seconds and repeat Steps 1 through 7 at least 10 times every 1-2 hours when you are awake. Take your time and take a few normal breaths between deep breaths. 9. The spirometer may include an indicator to  show your best effort. Use the indicator as a goal to work toward during each repetition. 10. After each set of 10 deep breaths, practice coughing to be sure your lungs are clear. If you have an incision (the cut made at the time of surgery), support your incision when coughing by placing a pillow or rolled up towels firmly against it. Once you are able to get out of bed, walk around indoors and cough well. You may stop using the incentive spirometer when instructed by your caregiver.  RISKS AND COMPLICATIONS  Take your time so you do not get dizzy or light-headed.  If you are in pain, you may need to take or ask for pain medication before doing incentive spirometry. It is harder to take a deep breath if you are having pain. AFTER USE  Rest and breathe slowly and easily.  It can be helpful to keep track of a log of your progress. Your caregiver can provide you with a simple table to help with this. If you are using the spirometer at home, follow these instructions: [place] IF:   You are having difficultly using the spirometer.  You have trouble using the spirometer as often as instructed.  Your pain medication is not giving enough relief while using the spirometer.  You develop fever of 100.5 F (38.1 C) or higher. SEEK IMMEDIATE MEDICAL CARE  IF:   You cough up bloody sputum that had not been present before.  You develop fever of 102 F (38.9 C) or greater.  You develop worsening pain at or near the incision site. MAKE SURE YOU:   Understand these instructions.  Will watch your condition.  Will get help right away if you are not doing well or get worse. Document Released: 12/15/2006 Document Revised: 10/27/2011 Document Reviewed: 02/15/2007 ExitCare Patient Information 2014 ExitCare, [place].   ________________________________________________________________________  WHAT IS A BLOOD TRANSFUSION? Blood Transfusion Information  A transfusion is the replacement of blood or some of its parts. Blood is made up of multiple cells which provide different functions.  Red blood cells carry oxygen and are used for blood loss replacement.  White blood cells fight against infection.  Platelets control bleeding.  Plasma helps clot blood.  Other blood products are available for specialized needs, such as hemophilia or other clotting disorders. BEFORE THE TRANSFUSION  Who gives blood for transfusions?   Healthy volunteers who are fully evaluated to make sure their blood is safe. This is blood bank blood. Transfusion therapy is the safest it has ever been in the practice of medicine. Before blood is taken from a donor, a complete history is taken to make sure that person has no history of diseases nor engages in risky social behavior (examples are intravenous drug use or sexual activity with multiple partners). The donor's travel history is screened to minimize risk of transmitting infections, such as malaria. The donated blood is tested for signs of infectious diseases, such as HIV and hepatitis. The blood is then tested to be sure it is compatible with you in order to minimize the chance of a transfusion reaction. If you or a relative donates blood, this is often done in anticipation of surgery and is not appropriate for emergency  situations. It takes many days to process the donated blood. RISKS AND COMPLICATIONS Although transfusion therapy is very safe and saves many lives, the main dangers of transfusion include:   Getting an infectious disease.  Developing a transfusion reaction. This is an allergic reaction  to something in the blood you were given. Every precaution is taken to prevent this. The decision to have a blood transfusion has been considered carefully by your caregiver before blood is given. Blood is not given unless the benefits outweigh the risks. AFTER THE TRANSFUSION  Right after receiving a blood transfusion, you will usually feel much better and more energetic. This is especially true if your red blood cells have gotten low (anemic). The transfusion raises the level of the red blood cells which carry oxygen, and this usually causes an energy increase.  The nurse administering the transfusion will monitor you carefully for complications. HOME CARE INSTRUCTIONS  No special instructions are needed after a transfusion. You may find your energy is better. Speak with your caregiver about any limitations on activity for underlying diseases you may have. SEEK MEDICAL CARE IF:   Your condition is not improving after your transfusion.  You develop redness or irritation at the intravenous (IV) site. SEEK IMMEDIATE MEDICAL CARE IF:  Any of the following symptoms occur over the next 12 hours:  Shaking chills.  You have a temperature by mouth above 102 F (38.9 C), not controlled by medicine.  Chest, back, or muscle pain.  People around you feel you are not acting correctly or are confused.  Shortness of breath or difficulty breathing.  Dizziness and fainting.  You get a rash or develop hives.  You have a decrease in urine output.  Your urine turns a dark color or changes to pink, red, or brown. Any of the following symptoms occur over the next 10 days:  You have a temperature by mouth above  102 F (38.9 C), not controlled by medicine.  Shortness of breath.  Weakness after normal activity.  The white part of the eye turns yellow (jaundice).  You have a decrease in the amount of urine or are urinating less often.  Your urine turns a dark color or changes to pink, red, or brown. Document Released: 08/01/2000 Document Revised: 10/27/2011 Document Reviewed: 03/20/2008 [facility] Patient Information 2014 [place], [place].  _______________________________________________________________________

## 2016-11-03 ENCOUNTER — Encounter (HOSPITAL_COMMUNITY): Payer: Self-pay

## 2016-11-03 ENCOUNTER — Ambulatory Visit (HOSPITAL_COMMUNITY)
Admission: RE | Admit: 2016-11-03 | Discharge: 2016-11-03 | Disposition: A | Discharge status: Home / Self Care | Payer: BLUE CROSS/BLUE SHIELD | Source: Ambulatory Visit | Attending: Urology | Admitting: Urology

## 2016-11-03 ENCOUNTER — Encounter (HOSPITAL_COMMUNITY)
Admission: RE | Admit: 2016-11-03 | Discharge: 2016-11-03 | Disposition: A | Discharge status: Home / Self Care | Payer: BLUE CROSS/BLUE SHIELD | Source: Ambulatory Visit | Attending: Urology | Admitting: Urology

## 2016-11-03 DIAGNOSIS — C61 Malignant neoplasm of prostate: Secondary | ICD-10-CM | POA: Insufficient documentation

## 2016-11-03 DIAGNOSIS — Z01812 Encounter for preprocedural laboratory examination: Secondary | ICD-10-CM | POA: Insufficient documentation

## 2016-11-03 DIAGNOSIS — Z01810 Encounter for preprocedural cardiovascular examination: Secondary | ICD-10-CM | POA: Diagnosis present

## 2016-11-03 DIAGNOSIS — Z01818 Encounter for other preprocedural examination: Secondary | ICD-10-CM | POA: Diagnosis not present

## 2016-11-03 DIAGNOSIS — Z01811 Encounter for preprocedural respiratory examination: Secondary | ICD-10-CM

## 2016-11-03 LAB — BASIC METABOLIC PANEL
Anion gap: 7 (ref 5–15)
BUN: 14 mg/dL (ref 6–20)
CO2: 26 mmol/L (ref 22–32)
Calcium: 8.7 mg/dL — ABNORMAL LOW (ref 8.9–10.3)
Chloride: 105 mmol/L (ref 101–111)
Creatinine, Ser: 0.71 mg/dL (ref 0.61–1.24)
GFR calc Af Amer: >60 mL/min (ref >60)
GFR calc non Af Amer: >60 mL/min (ref >60)
Glucose, Bld: 94 mg/dL (ref 65–99)
Potassium: 4.3 mmol/L (ref 3.5–5.1)
Sodium: 138 mmol/L (ref 135–145)

## 2016-11-03 LAB — CBC
HCT: 41.7 % (ref 39.0–52.0)
Hemoglobin: 13.5 g/dL (ref 13.0–17.0)
MCH: 26.7 pg (ref 26.0–34.0)
MCHC: 32.4 g/dL (ref 30.0–36.0)
MCV: 82.6 fL (ref 78.0–100.0)
Platelets: 258 10*3/uL (ref 150–400)
RBC: 5.05 MIL/uL (ref 4.22–5.81)
RDW: 13.8 % (ref 11.5–15.5)
WBC: 7.7 10*3/uL (ref 4.0–10.5)

## 2016-11-03 LAB — ABO/RH: ABO/RH(D): O POS

## 2016-11-04 ENCOUNTER — Other Ambulatory Visit (HOSPITAL_COMMUNITY): Payer: Self-pay | Admitting: Urology

## 2016-11-04 ENCOUNTER — Ambulatory Visit (HOSPITAL_COMMUNITY)
Admission: RE | Admit: 2016-11-04 | Discharge: 2016-11-04 | Disposition: A | Discharge status: Home / Self Care | Payer: BLUE CROSS/BLUE SHIELD | Source: Ambulatory Visit | Attending: Urology | Admitting: Urology

## 2016-11-04 DIAGNOSIS — R918 Other nonspecific abnormal finding of lung field: Secondary | ICD-10-CM | POA: Diagnosis not present

## 2016-11-04 DIAGNOSIS — C61 Malignant neoplasm of prostate: Secondary | ICD-10-CM

## 2016-11-07 NOTE — H&P (Signed)
[facility]     10/14/2016   --------------------------------------------------------------------------------   Russell Sampson  MRN: [id number]  PRIMARY CARE:    DOB: November 27, 1950, 66 year old Male  REFERRING:    SSN: [id number]  PROVIDER:  Raynelle Bring, M.D.    LOCATION:  [facility] [phone]   --------------------------------------------------------------------------------   CC/HPI: CC: Prostate Cancer   PCP:  Location of consult: [facility]   Russell Sampson is a 66 year old gentleman with a strong family history of prostate cancer who was noted to have an increased in his PSA from a baseline of 1.65 up to 3.9. He underwent a TRUS biopsy of the prostate on 09/19/16 that confirmed Gleason 3+4=7 adenocarcinoma of the prostate with 6 out of 12 biopsy cores positive for malignancy.   Family history: His father died of metastatic prostate cancer in his 4s. This is a source of severe anxiety for Russell Sampson.   Imaging studies: None.   PMH: He has a history of no major medical problems.  PSH: No abdominal surgeries.   TNM stage: cT1c Nx Mx  PSA: 3.9  Gleason score: 3+4=7  Biopsy (09/19/16): 6/12 cores positive  Left: L lateral mid (10%, 3+4=7), L mid (70%, 3+4=7), L lateral base (60%, 3+4=7, PNI)  Right: R apex (5%, 3+3=6), R lateral mid (90%, 3+3=6), R lateral base (20%, 3+3=6)  Prostate volume: 42 cc   Nomogram  OC disease: 41%  EPE: 58%  SVI: 5%  LNI: 4%  PFS (5 year, 10 year): 89%,82%   Urinary function: IPSS is 2.  Erectile function: SHIM score is 23. However, he is not sexually active and has not been for the last 3-4 years due to his wife's health issues.     ALLERGIES: None   MEDICATIONS: Diazepam 10 mg tablet Take one tablet 30-60 minutes prior to procedure  Airborne Vitamin C  Multivitamin  Vitamin B Complex     GU PSH: Prostate Needle Biopsy - 09/19/2016    NON-GU PSH: Surgical  Pathology, Gross And Microscopic Examination For Prostate Needle - 09/19/2016    GU PMH: None   NON-GU PMH: Anxiety    FAMILY HISTORY: Dementia - Mother Prostate Cancer - Father   SOCIAL HISTORY: Marital Status: Married Current Smoking Status: Patient has never smoked.   Tobacco Use Assessment Completed: Used Tobacco in last 30 days? Has never drank.  Drinks 1 caffeinated drink per day.    REVIEW OF SYSTEMS:    GU Review Male:   Patient denies frequent urination, hard to postpone urination, burning/ pain with urination, get up at night to urinate, leakage of urine, stream starts and stops, trouble starting your streams, and have to strain to urinate .  Gastrointestinal (Upper):   Patient denies nausea and vomiting.  Gastrointestinal (Lower):   Patient denies diarrhea and constipation.  Constitutional:   Patient denies fever, night sweats, weight loss, and fatigue.  Skin:   Patient denies skin rash/ lesion and itching.  Eyes:   Patient denies double vision and blurred vision.  Ears/ Nose/ Throat:   Patient denies sore throat and sinus problems.  Hematologic/Lymphatic:   Patient denies swollen glands and easy bruising.  Cardiovascular:   Patient denies leg swelling and chest pains.  Respiratory:   Patient denies cough and shortness of breath.  Endocrine:   Patient denies excessive thirst.  Musculoskeletal:   Patient denies back pain and joint pain.  Neurological:   Patient denies  headaches and dizziness.  Psychologic:   Patient denies depression and anxiety.   VITAL SIGNS: None   MULTI-SYSTEM PHYSICAL EXAMINATION:    Constitutional: Well-nourished. No physical deformities. Normally developed. Good grooming.  Respiratory: No labored breathing, no use of accessory muscles. Clear bilaterally.  Cardiovascular: Normal temperature, normal extremity pulses, no swelling, no varicosities. Regular rate and rhythm.  Gastrointestinal: No mass, no tenderness, no rigidity, non obese abdomen.      PAST DATA REVIEWED:  Source Of History:  Patient  Lab Test Review:   PSA  Records Review:   Pathology Reports, Previous Patient Records  Urine Test Review:   Urinalysis   PROCEDURES: None   ASSESSMENT:      ICD-10 Details  1 GU:   Prostate Cancer - C61    PLAN:           Document Letter(s):  Created for Patient: Clinical Summary         Notes:   1. Prostate cancer: I had a detailed discussion with Russell Sampson and his wife today regarding his prostate cancer diagnosis.   The patient was counseled about the natural history of prostate cancer and the standard treatment options that are available for prostate cancer. It was explained to him how his age and life expectancy, clinical stage, Gleason score, and PSA affect his prognosis, the decision to proceed with additional staging studies, as well as how that information influences recommended treatment strategies. We discussed the roles for active surveillance, radiation therapy, surgical therapy, androgen deprivation, as well as ablative therapy options for the treatment of prostate cancer as appropriate to his individual cancer situation. We discussed the risks and benefits of these options with regard to their impact on cancer control and also in terms of potential adverse events, complications, and impact on quality of life particularly related to urinary and sexual function. The patient was encouraged to ask questions throughout the discussion today and all questions were answered to his stated satisfaction. In addition, the patient was provided with and/or directed to appropriate resources and literature for further education about prostate cancer and treatment options.   We discussed surgical therapy for prostate cancer including the different available surgical approaches. We discussed, in detail, the risks and expectations of surgery with regard to cancer control, urinary control, and erectile function as well as the expected  postoperative recovery process. Additional risks of surgery including but not limited to bleeding, infection, hernia formation, nerve damage, lymphocele formation, bowel/rectal injury potentially necessitating colostomy, damage to the urinary tract resulting in urine leakage, urethral stricture, and the cardiopulmonary risks such as myocardial infarction, stroke, death, venothromboembolism, etc. were explained. The risk of open surgical conversion for robotic/laparoscopic prostatectomy was also discussed.   He appears to be leaning towards surgical therapy although would like to meet with Dr. Tammi Klippel this afternoon and discuss his radiotherapy options as well. If he does proceed with surgical therapy, my plan will be to perform a bilateral nerve sparing robot-assisted laparoscopic radical prostatectomy and pelvic lymphadenectomy.   Cc: Dr. Tyler Pita  Dr. Zola Button          E & M CODE: I spent at least 40 minutes face to face with the patient, more than 50% of that time was spent on counseling and/or coordinating care.     * Signed by Raynelle Bring, M.D. on 10/14/16 at 5:00 PM (EST)*       APPENDED NOTES:  Pt called and elected to proceed with surgery.     *  Signed by Raynelle Bring, M.D. on 10/20/16 at 5:21 PM (EST)*

## 2016-11-10 ENCOUNTER — Encounter (HOSPITAL_COMMUNITY)
Admission: RE | Disposition: A | Discharge status: Home / Self Care | Payer: Self-pay | Source: Ambulatory Visit | Attending: Urology

## 2016-11-10 ENCOUNTER — Observation Stay (HOSPITAL_COMMUNITY)
Admission: RE | Admit: 2016-11-10 | Discharge: 2016-11-11 | Disposition: A | Discharge status: Home / Self Care | Payer: BLUE CROSS/BLUE SHIELD | Source: Ambulatory Visit | Attending: Urology | Admitting: Urology

## 2016-11-10 ENCOUNTER — Encounter (HOSPITAL_COMMUNITY): Payer: Self-pay | Admitting: *Deleted

## 2016-11-10 ENCOUNTER — Ambulatory Visit (HOSPITAL_COMMUNITY): Payer: BLUE CROSS/BLUE SHIELD | Admitting: Anesthesiology

## 2016-11-10 DIAGNOSIS — T884XXA Failed or difficult intubation, initial encounter: Secondary | ICD-10-CM

## 2016-11-10 DIAGNOSIS — C61 Malignant neoplasm of prostate: Secondary | ICD-10-CM | POA: Diagnosis not present

## 2016-11-10 DIAGNOSIS — Z8042 Family history of malignant neoplasm of prostate: Secondary | ICD-10-CM | POA: Insufficient documentation

## 2016-11-10 HISTORY — PX: LYMPHADENECTOMY: SHX5960

## 2016-11-10 HISTORY — DX: Failed or difficult intubation, initial encounter: T88.4XXA

## 2016-11-10 HISTORY — PX: ROBOT ASSISTED LAPAROSCOPIC RADICAL PROSTATECTOMY: SHX5141

## 2016-11-10 LAB — HEMOGLOBIN AND HEMATOCRIT, BLOOD
HCT: 39.4 % (ref 39.0–52.0)
Hemoglobin: 12.9 g/dL — ABNORMAL LOW (ref 13.0–17.0)

## 2016-11-10 LAB — TYPE AND SCREEN
ABO/RH(D): O POS
Antibody Screen: NEGATIVE

## 2016-11-10 SURGERY — XI ROBOTIC ASSISTED LAPAROSCOPIC RADICAL PROSTATECTOMY LEVEL 2
Anesthesia: General

## 2016-11-10 MED ORDER — HEPARIN SODIUM (PORCINE) 1000 UNIT/ML IJ SOLN
INTRAMUSCULAR | Status: AC
  Filled 2016-11-10: qty 1

## 2016-11-10 MED ORDER — HYDROMORPHONE HCL 1 MG/ML IJ SOLN
INTRAMUSCULAR | Status: AC
  Administered 2016-11-10: 0.5 mg via INTRAVENOUS
  Filled 2016-11-10: qty 1

## 2016-11-10 MED ORDER — HYDROMORPHONE HCL 2 MG/ML IJ SOLN
INTRAMUSCULAR | Status: AC
  Filled 2016-11-10: qty 1

## 2016-11-10 MED ORDER — DIPHENHYDRAMINE HCL 50 MG/ML IJ SOLN: 12.5000 mg | Freq: Four times a day (QID) | INTRAMUSCULAR | Status: DC | PRN

## 2016-11-10 MED ORDER — ROCURONIUM BROMIDE 10 MG/ML (PF) SYRINGE: PREFILLED_SYRINGE | INTRAVENOUS | Status: DC | PRN

## 2016-11-10 MED ORDER — LACTATED RINGERS IV SOLN
INTRAVENOUS | Status: DC | PRN
  Administered 2016-11-10: 11:00:00

## 2016-11-10 MED ORDER — SUFENTANIL CITRATE 50 MCG/ML IV SOLN
INTRAVENOUS | Status: DC | PRN
  Administered 2016-11-10 (×2): 10 ug via INTRAVENOUS
  Administered 2016-11-10: 20 ug via INTRAVENOUS
  Administered 2016-11-10: 10 ug via INTRAVENOUS

## 2016-11-10 MED ORDER — PHENYLEPHRINE 40 MCG/ML (10ML) SYRINGE FOR IV PUSH (FOR BLOOD PRESSURE SUPPORT)
PREFILLED_SYRINGE | INTRAVENOUS | Status: DC | PRN
  Administered 2016-11-10: 40 ug via INTRAVENOUS

## 2016-11-10 MED ORDER — ONDANSETRON HCL 4 MG/2ML IJ SOLN
4.0000 mg | INTRAMUSCULAR | Status: DC | PRN
  Administered 2016-11-10: 4 mg via INTRAVENOUS
  Filled 2016-11-10: qty 2

## 2016-11-10 MED ORDER — LIDOCAINE 2% (20 MG/ML) 5 ML SYRINGE
INTRAMUSCULAR | Status: DC | PRN
  Administered 2016-11-10: 100 mg via INTRAVENOUS

## 2016-11-10 MED ORDER — ROCURONIUM BROMIDE 50 MG/5ML IV SOSY
PREFILLED_SYRINGE | INTRAVENOUS | Status: AC
  Filled 2016-11-10: qty 5

## 2016-11-10 MED ORDER — DEXAMETHASONE SODIUM PHOSPHATE 10 MG/ML IJ SOLN
INTRAMUSCULAR | Status: DC | PRN
  Administered 2016-11-10: 10 mg via INTRAVENOUS

## 2016-11-10 MED ORDER — DOCUSATE SODIUM 100 MG PO CAPS
100.0000 mg | ORAL_CAPSULE | Freq: Two times a day (BID) | ORAL | Status: DC
  Administered 2016-11-11: 100 mg via ORAL
  Filled 2016-11-10: qty 1

## 2016-11-10 MED ORDER — LACTATED RINGERS IV SOLN
INTRAVENOUS | Status: DC
  Administered 2016-11-10 (×3): via INTRAVENOUS

## 2016-11-10 MED ORDER — KCL IN DEXTROSE-NACL 20-5-0.45 MEQ/L-%-% IV SOLN
INTRAVENOUS | Status: DC
  Administered 2016-11-10 – 2016-11-11 (×3): via INTRAVENOUS
  Filled 2016-11-10 (×3): qty 1000

## 2016-11-10 MED ORDER — CITALOPRAM HYDROBROMIDE 20 MG PO TABS
10.0000 mg | ORAL_TABLET | Freq: Every day | ORAL | Status: DC
  Administered 2016-11-11: 10 mg via ORAL
  Filled 2016-11-10: qty 1

## 2016-11-10 MED ORDER — MORPHINE SULFATE (PF) 4 MG/ML IV SOLN: 2.0000 mg | INTRAVENOUS | Status: DC | PRN

## 2016-11-10 MED ORDER — KETOROLAC TROMETHAMINE 15 MG/ML IJ SOLN
15.0000 mg | Freq: Four times a day (QID) | INTRAMUSCULAR | Status: DC
  Administered 2016-11-10 – 2016-11-11 (×4): 15 mg via INTRAVENOUS
  Filled 2016-11-10 (×4): qty 1

## 2016-11-10 MED ORDER — HYDROMORPHONE HCL 1 MG/ML IJ SOLN
INTRAMUSCULAR | Status: DC | PRN
  Administered 2016-11-10 (×5): .4 mg via INTRAVENOUS

## 2016-11-10 MED ORDER — PHENYLEPHRINE 40 MCG/ML (10ML) SYRINGE FOR IV PUSH (FOR BLOOD PRESSURE SUPPORT)
PREFILLED_SYRINGE | INTRAVENOUS | Status: AC
  Filled 2016-11-10: qty 10

## 2016-11-10 MED ORDER — KETOROLAC TROMETHAMINE 30 MG/ML IJ SOLN
30.0000 mg | Freq: Once | INTRAMUSCULAR | Status: AC
  Administered 2016-11-10: 30 mg via INTRAVENOUS

## 2016-11-10 MED ORDER — PROMETHAZINE HCL 25 MG/ML IJ SOLN: 6.2500 mg | INTRAMUSCULAR | Status: DC | PRN

## 2016-11-10 MED ORDER — SULFAMETHOXAZOLE-TRIMETHOPRIM 800-160 MG PO TABS: 1.0000 | ORAL_TABLET | Freq: Two times a day (BID) | ORAL | 0 refills | Status: DC

## 2016-11-10 MED ORDER — MIDAZOLAM HCL 2 MG/2ML IJ SOLN
INTRAMUSCULAR | Status: DC | PRN
  Administered 2016-11-10 (×2): 1 mg via INTRAVENOUS

## 2016-11-10 MED ORDER — PROPOFOL 10 MG/ML IV BOLUS
INTRAVENOUS | Status: DC | PRN
  Administered 2016-11-10: 170 mg via INTRAVENOUS
  Administered 2016-11-10: 30 mg via INTRAVENOUS

## 2016-11-10 MED ORDER — SUGAMMADEX SODIUM 200 MG/2ML IV SOLN
INTRAVENOUS | Status: AC
  Filled 2016-11-10: qty 2

## 2016-11-10 MED ORDER — BUPIVACAINE HCL (PF) 0.25 % IJ SOLN
INTRAMUSCULAR | Status: AC
  Filled 2016-11-10: qty 30

## 2016-11-10 MED ORDER — CEFAZOLIN SODIUM-DEXTROSE 2-4 GM/100ML-% IV SOLN
2.0000 g | INTRAVENOUS | Status: AC
  Administered 2016-11-10: 2 g via INTRAVENOUS
  Filled 2016-11-10: qty 100

## 2016-11-10 MED ORDER — BUPIVACAINE HCL 0.25 % IJ SOLN
INTRAMUSCULAR | Status: DC | PRN
Start: 2016-11-10 — End: 2016-11-10
  Administered 2016-11-10: 30 mL

## 2016-11-10 MED ORDER — LIDOCAINE 2% (20 MG/ML) 5 ML SYRINGE
INTRAMUSCULAR | Status: AC
  Filled 2016-11-10: qty 5

## 2016-11-10 MED ORDER — HYDROCODONE-ACETAMINOPHEN 5-325 MG PO TABS: 1.0000 | ORAL_TABLET | Freq: Four times a day (QID) | ORAL | 0 refills | Status: DC | PRN

## 2016-11-10 MED ORDER — SODIUM CHLORIDE 0.9 % IR SOLN
Status: DC | PRN
  Administered 2016-11-10: 1 via INTRAVESICAL

## 2016-11-10 MED ORDER — MEPERIDINE HCL 50 MG/ML IJ SOLN: 6.2500 mg | INTRAMUSCULAR | Status: DC | PRN

## 2016-11-10 MED ORDER — HYDROMORPHONE HCL 1 MG/ML IJ SOLN
0.2500 mg | INTRAMUSCULAR | Status: DC | PRN
  Administered 2016-11-10: 0.5 mg via INTRAVENOUS

## 2016-11-10 MED ORDER — SUGAMMADEX SODIUM 200 MG/2ML IV SOLN
INTRAVENOUS | Status: DC | PRN
  Administered 2016-11-10: 200 mg via INTRAVENOUS

## 2016-11-10 MED ORDER — SUFENTANIL CITRATE 50 MCG/ML IV SOLN
INTRAVENOUS | Status: AC
  Filled 2016-11-10: qty 1

## 2016-11-10 MED ORDER — STERILE WATER FOR IRRIGATION IR SOLN
Status: DC | PRN
  Administered 2016-11-10: 1000 mL

## 2016-11-10 MED ORDER — ROCURONIUM BROMIDE 10 MG/ML (PF) SYRINGE
PREFILLED_SYRINGE | INTRAVENOUS | Status: DC | PRN
  Administered 2016-11-10: 50 mg via INTRAVENOUS
  Administered 2016-11-10 (×2): 10 mg via INTRAVENOUS
  Administered 2016-11-10: 20 mg via INTRAVENOUS

## 2016-11-10 MED ORDER — PROPOFOL 10 MG/ML IV BOLUS
INTRAVENOUS | Status: AC
  Filled 2016-11-10: qty 20

## 2016-11-10 MED ORDER — ACETAMINOPHEN 325 MG PO TABS: 650.0000 mg | ORAL_TABLET | ORAL | Status: DC | PRN

## 2016-11-10 MED ORDER — SODIUM CHLORIDE 0.9 % IJ SOLN
INTRAMUSCULAR | Status: AC
  Filled 2016-11-10: qty 10

## 2016-11-10 MED ORDER — SUCCINYLCHOLINE CHLORIDE 200 MG/10ML IV SOSY
PREFILLED_SYRINGE | INTRAVENOUS | Status: AC
  Filled 2016-11-10: qty 10

## 2016-11-10 MED ORDER — CEFAZOLIN IN D5W 1 GM/50ML IV SOLN
1.0000 g | Freq: Three times a day (TID) | INTRAVENOUS | Status: AC
  Administered 2016-11-10 – 2016-11-11 (×2): 1 g via INTRAVENOUS
  Filled 2016-11-10 (×3): qty 50

## 2016-11-10 MED ORDER — MIDAZOLAM HCL 2 MG/2ML IJ SOLN
INTRAMUSCULAR | Status: AC
  Filled 2016-11-10: qty 2

## 2016-11-10 MED ORDER — SODIUM CHLORIDE 0.9 % IV BOLUS (SEPSIS)
1000.0000 mL | Freq: Once | INTRAVENOUS | Status: AC
  Administered 2016-11-10: 1000 mL via INTRAVENOUS

## 2016-11-10 MED ORDER — ONDANSETRON HCL 4 MG/2ML IJ SOLN
INTRAMUSCULAR | Status: AC
  Filled 2016-11-10: qty 2

## 2016-11-10 MED ORDER — DIPHENHYDRAMINE HCL 12.5 MG/5ML PO ELIX: 12.5000 mg | ORAL_SOLUTION | Freq: Four times a day (QID) | ORAL | Status: DC | PRN

## 2016-11-10 MED ORDER — ONDANSETRON HCL 4 MG/2ML IJ SOLN
INTRAMUSCULAR | Status: DC | PRN
  Administered 2016-11-10: 4 mg via INTRAVENOUS

## 2016-11-10 MED ORDER — KETOROLAC TROMETHAMINE 30 MG/ML IJ SOLN
INTRAMUSCULAR | Status: AC
Start: 2016-11-10 — End: 2016-11-11
  Filled 2016-11-10: qty 1

## 2016-11-10 MED ORDER — DEXAMETHASONE SODIUM PHOSPHATE 10 MG/ML IJ SOLN
INTRAMUSCULAR | Status: AC
  Filled 2016-11-10: qty 1

## 2016-11-10 SURGICAL SUPPLY — 53 items
APPLICATOR COTTON TIP 6IN STRL (MISCELLANEOUS) ×3 IMPLANT
CATH FOLEY 2WAY SLVR 18FR 30CC (CATHETERS) ×3 IMPLANT
CATH ROBINSON RED A/P 16FR (CATHETERS) ×3 IMPLANT
CATH ROBINSON RED A/P 8FR (CATHETERS) ×3 IMPLANT
CATH TIEMANN FOLEY 18FR 5CC (CATHETERS) ×3 IMPLANT
CHLORAPREP W/TINT 26ML (MISCELLANEOUS) ×3 IMPLANT
CLIP LIGATING HEM O LOK PURPLE (MISCELLANEOUS) ×6 IMPLANT
COVER SURGICAL LIGHT HANDLE (MISCELLANEOUS) ×3 IMPLANT
COVER TIP SHEARS 8 DVNC (MISCELLANEOUS) ×2 IMPLANT
COVER TIP SHEARS 8MM DA VINCI (MISCELLANEOUS) ×1
CUTTER ECHEON FLEX ENDO 45 340 (ENDOMECHANICALS) ×3 IMPLANT
DECANTER SPIKE VIAL GLASS SM (MISCELLANEOUS) ×3 IMPLANT
DERMABOND ADVANCED (GAUZE/BANDAGES/DRESSINGS) ×1
DERMABOND ADVANCED .7 DNX12 (GAUZE/BANDAGES/DRESSINGS) ×2 IMPLANT
DRAPE ARM DVNC X/XI (DISPOSABLE) ×8 IMPLANT
DRAPE COLUMN DVNC XI (DISPOSABLE) ×2 IMPLANT
DRAPE DA VINCI XI ARM (DISPOSABLE) ×4
DRAPE DA VINCI XI COLUMN (DISPOSABLE) ×1
DRAPE SURG IRRIG POUCH 19X23 (DRAPES) ×3 IMPLANT
DRSG TEGADERM 4X4.75 (GAUZE/BANDAGES/DRESSINGS) ×3 IMPLANT
ELECT REM PT RETURN 15FT ADLT (MISCELLANEOUS) ×3 IMPLANT
GAUZE SPONGE 2X2 8PLY STRL LF (GAUZE/BANDAGES/DRESSINGS) ×2 IMPLANT
GLOVE BIO SURGEON STRL SZ 6.5 (GLOVE) ×3 IMPLANT
GLOVE BIOGEL M STRL SZ7.5 (GLOVE) ×6 IMPLANT
GOWN STRL REUS W/TWL LRG LVL3 (GOWN DISPOSABLE) ×9 IMPLANT
HOLDER FOLEY CATH W/STRAP (MISCELLANEOUS) ×3 IMPLANT
IRRIG SUCT STRYKERFLOW 2 WTIP (MISCELLANEOUS) ×3
IRRIGATION SUCT STRKRFLW 2 WTP (MISCELLANEOUS) ×2 IMPLANT
IV LACTATED RINGERS 1000ML (IV SOLUTION) ×3 IMPLANT
NDL SAFETY ECLIPSE 18X1.5 (NEEDLE) ×2 IMPLANT
NEEDLE HYPO 18GX1.5 SHARP (NEEDLE) ×1
PACK ROBOT UROLOGY CUSTOM (CUSTOM PROCEDURE TRAY) ×3 IMPLANT
RELOAD GREEN ECHELON 45 (STAPLE) ×3 IMPLANT
SEAL CANN UNIV 5-8 DVNC XI (MISCELLANEOUS) ×8 IMPLANT
SEAL XI 5MM-8MM UNIVERSAL (MISCELLANEOUS) ×4
SOLUTION ELECTROLUBE (MISCELLANEOUS) ×3 IMPLANT
SPONGE GAUZE 2X2 STER 10/PKG (GAUZE/BANDAGES/DRESSINGS) ×1
SUT ETHILON 3 0 PS 1 (SUTURE) ×3 IMPLANT
SUT MNCRL 3 0 RB1 (SUTURE) ×2 IMPLANT
SUT MNCRL 3 0 VIOLET RB1 (SUTURE) ×2 IMPLANT
SUT MNCRL AB 4-0 PS2 18 (SUTURE) ×6 IMPLANT
SUT MONOCRYL 3 0 RB1 (SUTURE) ×2
SUT VIC AB 0 CT1 27 (SUTURE) ×1
SUT VIC AB 0 CT1 27XBRD ANTBC (SUTURE) ×2 IMPLANT
SUT VIC AB 0 UR5 27 (SUTURE) ×3 IMPLANT
SUT VIC AB 2-0 SH 27 (SUTURE) ×1
SUT VIC AB 2-0 SH 27X BRD (SUTURE) ×2 IMPLANT
SUT VICRYL 0 UR6 27IN ABS (SUTURE) ×6 IMPLANT
SYR 27GX1/2 1ML LL SAFETY (SYRINGE) ×3 IMPLANT
TOWEL OR 17X26 10 PK STRL BLUE (TOWEL DISPOSABLE) ×3 IMPLANT
TOWEL OR NON WOVEN STRL DISP B (DISPOSABLE) ×3 IMPLANT
TUBING INSUFFLATION 10FT LAP (TUBING) IMPLANT
WATER STERILE IRR 1500ML POUR (IV SOLUTION) ×6 IMPLANT

## 2016-11-10 NOTE — Op Note (Signed)
Preoperative diagnosis: Clinically localized adenocarcinoma of the prostate (clinical stage T1c Nx Mx)  Postoperative diagnosis: Clinically localized adenocarcinoma of the prostate (clinical stage T1c Nx Mx)  Procedure:  1. Robotic assisted laparoscopic radical prostatectomy (bilateral nerve sparing) 2. Bilateral robotic assisted laparoscopic pelvic lymphadenectomy  Surgeon: Pryor Curia. M.D.  Assistant: Debbrah Alar, PA-C  An assistant was required for this surgical procedure.  The duties of the assistant included but were not limited to suctioning, passing suture, camera manipulation, retraction. This procedure would not be able to be performed without an Environmental consultant.  Anesthesia: General  Complications: None  EBL: 200 mL  IVF:  2000 mL crystalloid  Specimens: 1. Prostate and seminal vesicles 2. Right pelvic lymph nodes 3. Left pelvic lymph nodes  Disposition of specimens: Pathology  Drains: 1. 20 Fr coude catheter 2. # 19 Blake pelvic drain  Indication: NORMAL Russell Sampson is a 66 y.o. year old patient with clinically localized prostate cancer.  After a thorough review of the management options for treatment of prostate cancer, he elected to proceed with surgical therapy and the above procedure(s).  We have discussed the potential benefits and risks of the procedure, side effects of the proposed treatment, the likelihood of the patient achieving the goals of the procedure, and any potential problems that might occur during the procedure or recuperation. Informed consent has been obtained.  Description of procedure:  The patient was taken to the operating room and a general anesthetic was administered. He was given preoperative antibiotics, placed in the dorsal lithotomy position, and prepped and draped in the usual sterile fashion. Next a preoperative timeout was performed. A urethral catheter was placed into the bladder and a site was selected near the umbilicus for  placement of the camera port. This was placed using a standard open Hassan technique which allowed entry into the peritoneal cavity under direct vision and without difficulty. An 8 mm robotic port was placed and a pneumoperitoneum established. The camera was then used to inspect the abdomen and there was no evidence of any intra-abdominal injuries or other abnormalities. The remaining abdominal ports were then placed. 8 mm robotic ports were placed in the right lower quadrant, left lower quadrant, and far left lateral abdominal wall. A 5 mm port was placed in the right upper quadrant and a 12 mm port was placed in the right lateral abdominal wall for laparoscopic assistance. All ports were placed under direct vision without difficulty. The surgical cart was then docked.   Utilizing the cautery scissors, the bladder was reflected posteriorly allowing entry into the space of Retzius and identification of the endopelvic fascia and prostate. The periprostatic fat was then removed from the prostate allowing full exposure of the endopelvic fascia. The endopelvic fascia was then incised from the apex back to the base of the prostate bilaterally and the underlying levator muscle fibers were swept laterally off the prostate thereby isolating the dorsal venous complex. The dorsal vein was then stapled and divided with a 45 mm Flex Echelon stapler. Attention then turned to the bladder neck which was divided anteriorly thereby allowing entry into the bladder and exposure of the urethral catheter. The catheter balloon was deflated and the catheter was brought into the operative field and used to retract the prostate anteriorly. The posterior bladder neck was then examined and was divided allowing further dissection between the bladder and prostate posteriorly until the vasa deferentia and seminal vessels were identified. The vasa deferentia were isolated, divided, and lifted anteriorly.  The seminal vesicles were dissected down  to their tips with care to control the seminal vascular arterial blood supply. These structures were then lifted anteriorly and the space between Denonvillier's fascia and the anterior rectum was developed with a combination of sharp and blunt dissection. This isolated the vascular pedicles of the prostate.  The lateral prostatic fascia was then sharply incised allowing release of the neurovascular bundles bilaterally. The vascular pedicles of the prostate were then ligated with Weck clips between the prostate and neurovascular bundles and divided with sharp cold scissor dissection resulting in neurovascular bundle preservation. The neurovascular bundles were then separated off the apex of the prostate and urethra bilaterally.  The urethra was then sharply transected allowing the prostate specimen to be disarticulated. The pelvis was copiously irrigated and hemostasis was ensured. There was no evidence for rectal injury.  Attention then turned to the right pelvic sidewall. The fibrofatty tissue between the external iliac vein, confluence of the iliac vessels, hypogastric artery, and Cooper's ligament was dissected free from the pelvic sidewall with care to preserve the obturator nerve. Weck clips were used for lymphostasis and hemostasis. An identical procedure was performed on the contralateral side and the lymphatic packets were removed for permanent pathologic analysis.  Attention then turned to the urethral anastomosis. A 2-0 Vicryl slip knot was placed between Denonvillier's fascia, the posterior bladder neck, and the posterior urethra to reapproximate these structures. A double-armed 3-0 Monocryl suture was then used to perform a 360 running tension-free anastomosis between the bladder neck and urethra. A new urethral catheter was then placed into the bladder and irrigated. There were no blood clots within the bladder and the anastomosis appeared to be watertight. A #19 Blake drain was then brought  through the left lateral 8 mm port site and positioned appropriately within the pelvis. It was secured to the skin with a nylon suture. The surgical cart was then undocked. The right lateral 12 mm port site was closed at the fascial level with a 0 Vicryl suture placed laparoscopically. All remaining ports were then removed under direct vision. The prostate specimen was removed intact within the Endopouch retrieval bag via the periumbilical camera port site. This fascial opening was closed with two running 0 Vicryl sutures. 0.25% Marcaine was then injected into all port sites and all incisions were reapproximated at the skin level with 4-0 Monocryl subcuticular sutures and Liquiband. The patient appeared to tolerate the procedure well and without complications. The patient was able to be extubated and transferred to the recovery unit in satisfactory condition.   Pryor Curia MD

## 2016-11-10 NOTE — Interval H&P Note (Signed)
History and Physical Interval Note:  11/10/2016 9:49 AM  Russell Sampson  has presented today for surgery, with the diagnosis of PROSTATE CANCER  The various methods of treatment have been discussed with the patient and family. After consideration of risks, benefits and other options for treatment, the patient has consented to  Procedure(s): XI ROBOTIC ASSISTED LAPAROSCOPIC RADICAL PROSTATECTOMY LEVEL 2 (N/A) PELVIC LYMPHADENECTOMY (Bilateral) as a surgical intervention .  The patient's history has been reviewed, patient examined, no change in status, stable for surgery.  I have reviewed the patient's chart and labs.  Questions were answered to the patient's satisfaction.     Akaila Rambo,LES

## 2016-11-10 NOTE — Transfer of Care (Signed)
Immediate Anesthesia Transfer of Care Note  Patient: Russell Sampson  Procedure(s) Performed: Procedure(s): XI ROBOTIC ASSISTED LAPAROSCOPIC RADICAL PROSTATECTOMY LEVEL 2 (N/A) PELVIC LYMPHADENECTOMY (Bilateral)  Patient Location: PACU  Anesthesia Type:General  Level of Consciousness: awake  Airway & Oxygen Therapy: Patient Spontanous Breathing and Patient connected to face mask oxygen  Post-op Assessment: Report given to RN and Post -op Vital signs reviewed and stable  Post vital signs: Reviewed and stable  Last Vitals:  Vitals:   11/10/16 0839  BP: (!) 146/89  Pulse: 86  Resp: 18  Temp: 36.5 C    Last Pain:  Vitals:   11/10/16 0839  TempSrc: Oral         Complications: No apparent anesthesia complications

## 2016-11-10 NOTE — Progress Notes (Signed)
Attempted to assist patient with ambulating. Patient sat up on side of bed with assistance. Within 5 minutes, patient began to dry heave. He vomited a small amount of clear emesis. Patient stated he "felt a little better" after vomiting. Laid back in the bed, stated he would attempt walking again in the morning. Will continue to monitor patient closely.

## 2016-11-10 NOTE — Anesthesia Postprocedure Evaluation (Signed)
Anesthesia Post Note  Patient: Russell Sampson  Procedure(s) Performed: Procedure(s) (LRB): XI ROBOTIC ASSISTED LAPAROSCOPIC RADICAL PROSTATECTOMY LEVEL 2 (N/A) PELVIC LYMPHADENECTOMY (Bilateral)  Patient location during evaluation: PACU Anesthesia Type: General Level of consciousness: awake Pain management: pain level controlled Vital Signs Assessment: post-procedure vital signs reviewed and stable Respiratory status: spontaneous breathing Cardiovascular status: stable Postop Assessment: no signs of nausea or vomiting Anesthetic complications: no        Last Vitals:  Vitals:   11/10/16 1500 11/10/16 1515  BP: (!) 161/96 (!) 160/97  Pulse: (!) 104 (!) 107  Resp: 12 15  Temp: 36.6 C 36.7 C    Last Pain:  Vitals:   11/10/16 1445  TempSrc:   PainSc: Asleep   Pain Goal:                 Maebel Marasco JR,JOHN Mylie Mccurley

## 2016-11-10 NOTE — Anesthesia Preprocedure Evaluation (Signed)
Anesthesia Evaluation  Patient identified by MRN, date of birth, ID band Patient awake    Reviewed: Allergy & Precautions, H&P , NPO status , Patient's Chart, lab work & pertinent test results  Airway Mallampati: I  TM Distance: >3 FB Neck ROM: full    Dental no notable dental hx. (+) Teeth Intact   Pulmonary neg pulmonary ROS,    Pulmonary exam normal        Cardiovascular negative cardio ROS Normal cardiovascular exam     Neuro/Psych negative neurological ROS     GI/Hepatic negative GI ROS, Neg liver ROS,   Endo/Other  negative endocrine ROS  Renal/GU negative Renal ROS     Musculoskeletal negative musculoskeletal ROS (+)   Abdominal Normal abdominal exam  (+)   Peds  Hematology negative hematology ROS (+)   Anesthesia Other Findings   Reproductive/Obstetrics negative OB ROS                             Anesthesia Physical Anesthesia Plan  ASA: II  Anesthesia Plan: General   Post-op Pain Management:    Induction: Intravenous  Airway Management Planned: Oral ETT  Additional Equipment:   Intra-op Plan:   Post-operative Plan: Extubation in OR  Informed Consent: I have reviewed the patients History and Physical, chart, labs and discussed the procedure including the risks, benefits and alternatives for the proposed anesthesia with the patient or authorized representative who has indicated his/her understanding and acceptance.   Dental Advisory Given  Plan Discussed with: CRNA and Surgeon  Anesthesia Plan Comments:         Anesthesia Quick Evaluation

## 2016-11-10 NOTE — Progress Notes (Signed)
Patient ID: Russell Sampson, male   DOB: Mar 01, 1951, 66 y.o.   MRN: [id number]  Post-op note  Subjective: The patient is doing well.  No complaints.  Objective: Vital signs in last 24 hours: Temp:  [97.7 F (36.5 C)-98 F (36.7 C)] 98 F (36.7 C) (03/26 1515) Pulse Rate:  [86-108] 107 (03/26 1515) Resp:  [12-21] 15 (03/26 1515) BP: (146-162)/(78-97) 160/97 (03/26 1515) SpO2:  [92 %-100 %] 95 % (03/26 1515) Weight:  [90.3 kg (199 lb)] 90.3 kg (199 lb) (03/26 0923)  Intake/Output from previous day: No intake/output data recorded. Intake/Output this shift: Total I/O In: 3100 [I.V.:2100; IV Piggyback:1000] Out: 925 [Urine:575; Drains:150; Blood:200]  Physical Exam:  General: Alert and oriented. Abdomen: Soft, Nondistended. Incisions: Clean and dry. GU: Urine clear.  Lab Results:  Recent Labs  11/10/16 1345  HGB 12.9*  HCT 39.4    Assessment/Plan: POD#0   1) Continue to monitor, ambulate, IS   Russell Sampson. MD   LOS: 0 days   Russell Sampson,LES 11/10/2016, 5:57 PM

## 2016-11-10 NOTE — Anesthesia Procedure Notes (Signed)
Procedure Name: Intubation Date/Time: 11/10/2016 10:46 AM Performed by: Danley Danker L Patient Re-evaluated:Patient Re-evaluated prior to inductionOxygen Delivery Method: Circle system utilized Preoxygenation: Pre-oxygenation with 100% oxygen Intubation Type: IV induction Ventilation: Mask ventilation without difficulty and Oral airway inserted - appropriate to patient size Laryngoscope Size: Miller and 3 Grade View: Grade III Tube type: Oral Tube size: 8.0 mm Number of attempts: 2 Airway Equipment and Method: Bougie stylet Placement Confirmation: ETT inserted through vocal cords under direct vision,  positive ETCO2 and breath sounds checked- equal and bilateral Secured at: 22 cm Tube secured with: Tape Dental Injury: Teeth and Oropharynx as per pre-operative assessment  Difficulty Due To: Difficult Airway- due to anterior larynx and Difficult Airway- due to reduced neck mobility Future Recommendations: Recommend- induction with short-acting agent, and alternative techniques readily available

## 2016-11-10 NOTE — Discharge Instructions (Signed)

## 2016-11-11 ENCOUNTER — Encounter: Payer: Self-pay | Admitting: Medical Oncology

## 2016-11-11 DIAGNOSIS — C61 Malignant neoplasm of prostate: Secondary | ICD-10-CM | POA: Diagnosis not present

## 2016-11-11 LAB — HEMOGLOBIN AND HEMATOCRIT, BLOOD
HCT: 38.5 % — ABNORMAL LOW (ref 39.0–52.0)
Hemoglobin: 12.7 g/dL — ABNORMAL LOW (ref 13.0–17.0)

## 2016-11-11 MED ORDER — HYDROCODONE-ACETAMINOPHEN 5-325 MG PO TABS: 1.0000 | ORAL_TABLET | Freq: Four times a day (QID) | ORAL | Status: DC | PRN

## 2016-11-11 MED ORDER — BISACODYL 10 MG RE SUPP
10.0000 mg | Freq: Once | RECTAL | Status: AC
  Administered 2016-11-11: 10 mg via RECTAL
  Filled 2016-11-11: qty 1

## 2016-11-11 NOTE — Progress Notes (Signed)
Russell Sampson doing well post op prostatectomy. He states he is so happy to have the cancer out of his body. He will follow up with Dr. Alinda Money next Monday for catheter removal. He is worried about getting the catheter out. We discussed how it is discontinued and there is very little discomfort. We discussed the importance of walking and deep breathing after discharge. He voiced understanding. I asked him to call me with any questions or concerns. He voiced understanding.

## 2016-11-11 NOTE — Progress Notes (Signed)
Patient ID: Russell Sampson, male   DOB: [birth year], 66 y.o.   MRN: [id number]  1 Day Post-Op Subjective: The patient is doing well.  Some nausea and vomiting last evening.  Improved now.  Pain is adequately controlled.  Objective: Vital signs in last 24 hours: Temp:  [97.7 F (36.5 C)-98 F (36.7 C)] 97.7 F (36.5 C) (03/27 0548) Pulse Rate:  [77-108] 87 (03/27 0548) Resp:  [12-21] 18 (03/27 0548) BP: (142-162)/(78-97) 142/80 (03/27 0548) SpO2:  [92 %-100 %] 100 % (03/27 0548) Weight:  [90.3 kg (199 lb)] 90.3 kg (199 lb) (03/26 0923)  Intake/Output from previous day: 03/26 0701 - 03/27 0700 In: 5577.5 [P.O.:300; I.V.:4177.5; IV Piggyback:1100] Out: 9030 [Urine:3350; Drains:225; Blood:200] Intake/Output this shift: No intake/output data recorded.  Physical Exam:  General: Alert and oriented. CV: RRR Lungs: Clear bilaterally. GI: Soft, Nondistended. Incisions: Clean, dry, and intact Urine: Clear Extremities: Nontender, no erythema, no edema.  Lab Results:  Recent Labs  11/10/16 1345 11/11/16 0553  HGB 12.9* 12.7*  HCT 39.4 38.5*      Assessment/Plan: POD# 1 s/p robotic prostatectomy.  1) SL IVF 2) Ambulate, Incentive spirometry 3) Transition to oral pain medication 4) Dulcolax suppository 5) D/C pelvic drain 6) Plan for likely discharge later today   Pryor Curia. MD   LOS: 0 days   Haytham Maher,LES 11/11/2016, 7:51 AM

## 2016-11-11 NOTE — Discharge Summary (Signed)
  Date of admission: 11/10/2016  Date of discharge: 11/11/2016  Admission diagnosis: Prostate Cancer  Discharge diagnosis: Prostate Cancer  History and Physical: For full details, please see admission history and physical. Briefly, Russell Sampson is a 66 y.o. gentleman with localized prostate cancer.  After discussing management/treatment options, he elected to proceed with surgical treatment.  Hospital Course: Russell Sampson was taken to the operating room on 11/10/2016 and underwent a robotic assisted laparoscopic radical prostatectomy. He tolerated this procedure well and without complications. Postoperatively, he was able to be transferred to a regular hospital room following recovery from anesthesia.  He was able to begin ambulating the night of surgery. He remained hemodynamically stable overnight.  He had excellent urine output with appropriately minimal output from his pelvic drain and his pelvic drain was removed on POD #1.  He was transitioned to oral pain medication, tolerated a clear liquid diet, and had met all discharge criteria and was able to be discharged home later on POD#1.  Laboratory values:  Recent Labs  11/10/16 1345 11/11/16 0553  HGB 12.9* 12.7*  HCT 39.4 38.5*    Disposition: Home  Discharge instruction: He was instructed to be ambulatory but to refrain from heavy lifting, strenuous activity, or driving. He was instructed on urethral catheter care.  Discharge medications:  Allergies as of 11/11/2016   No Known Allergies     Medication List    STOP taking these medications   AIRBORNE Chew   B COMPLEX-C-E PO   multivitamin with minerals tablet     TAKE these medications   citalopram 10 MG tablet Commonly known as:  CELEXA Take 1 tablet (10 mg total) by mouth daily.   HYDROcodone-acetaminophen 5-325 MG tablet Commonly known as:  NORCO Take 1-2 tablets by mouth every 6 (six) hours as needed for moderate pain or severe pain.    sulfamethoxazole-trimethoprim 800-160 MG tablet Commonly known as:  BACTRIM DS,SEPTRA DS Take 1 tablet by mouth 2 (two) times daily. Start the day prior to foley removal appointment       Followup: He will followup in 1 week for catheter removal and to discuss his surgical pathology results.

## 2016-11-21 ENCOUNTER — Encounter: Payer: Self-pay | Admitting: Family

## 2016-11-21 ENCOUNTER — Ambulatory Visit (INDEPENDENT_AMBULATORY_CARE_PROVIDER_SITE_OTHER): Payer: BLUE CROSS/BLUE SHIELD | Admitting: Family

## 2016-11-21 VITALS — BP 126/64 | HR 81 | Temp 99.0°F | Resp 18 | Ht 70.5 in | Wt 192.0 lb

## 2016-11-21 DIAGNOSIS — F419 Anxiety disorder, unspecified: Secondary | ICD-10-CM | POA: Diagnosis not present

## 2016-11-21 MED ORDER — CITALOPRAM HYDROBROMIDE 20 MG PO TABS: 20.0000 mg | ORAL_TABLET | Freq: Every day | ORAL | 0 refills | Status: DC

## 2016-11-21 NOTE — Patient Instructions (Addendum)
Thank you for choosing [facility].  SUMMARY AND INSTRUCTIONS:  Please increase your medication to 20 mg daily.  Follow up:  If your symptoms worsen or fail to improve, please contact our office for further instruction, or in case of emergency go directly to the emergency room at the closest medical facility.    Stress and Stress Management Stress is a normal reaction to life events. It is what you feel when life demands more than you are used to or more than you can handle. Some stress can be useful. For example, the stress reaction can help you catch the last bus of the day, study for a test, or meet a deadline at work. But stress that occurs too often or for too long can cause problems. It can affect your emotional health and interfere with relationships and normal daily activities. Too much stress can weaken your immune system and increase your risk for physical illness. If you already have a medical problem, stress can make it worse. What are the causes? All sorts of life events may cause stress. An event that causes stress for one person may not be stressful for another person. Major life events commonly cause stress. These may be positive or negative. Examples include losing your job, moving into a new home, getting married, having a baby, or losing a loved one. Less obvious life events may also cause stress, especially if they occur day after day or in combination. Examples include working long hours, driving in traffic, caring for children, being in debt, or being in a difficult relationship. What are the signs or symptoms? Stress may cause emotional symptoms including, the following:  Anxiety. This is feeling worried, afraid, on edge, overwhelmed, or out of control.  Anger. This is feeling irritated or impatient.  Depression. This is feeling sad, down, helpless, or guilty.  Difficulty focusing, remembering, or making decisions. Stress may cause physical symptoms, including  the following:  Aches and pains. These may affect your head, neck, back, stomach, or other areas of your body.  Tight muscles or clenched jaw.  Low energy or trouble sleeping. Stress may cause unhealthy behaviors, including the following:  Eating to feel better (overeating) or skipping meals.  Sleeping too little, too much, or both.  Working too much or putting off tasks (procrastination).  Smoking, drinking alcohol, or using drugs to feel better. How is this diagnosed? Stress is diagnosed through an assessment by your health care provider. Your health care provider will ask questions about your symptoms and any stressful life events.Your health care provider will also ask about your medical history and may order blood tests or other tests. Certain medical conditions and medicine can cause physical symptoms similar to stress. Mental illness can cause emotional symptoms and unhealthy behaviors similar to stress. Your health care provider may refer you to a mental health professional for further evaluation. How is this treated? Stress management is the recommended treatment for stress.The goals of stress management are reducing stressful life events and coping with stress in healthy ways. Techniques for reducing stressful life events include the following:  Stress identification. Self-monitor for stress and identify what causes stress for you. These skills may help you to avoid some stressful events.  Time management. Set your priorities, keep a calendar of events, and learn to say "no." These tools can help you avoid making too many commitments. Techniques for coping with stress include the following:  Rethinking the problem. Try to think realistically about stressful events rather than  ignoring them or overreacting. Try to find the positives in a stressful situation rather than focusing on the negatives.  Exercise. Physical exercise can release both physical and emotional tension. The  key is to find a form of exercise you enjoy and do it regularly.  Relaxation techniques. These relax the body and mind. Examples include yoga, meditation, tai chi, biofeedback, deep breathing, progressive muscle relaxation, listening to music, being out in nature, journaling, and other hobbies. Again, the key is to find one or more that you enjoy and can do regularly.  Healthy lifestyle. Eat a balanced diet, get plenty of sleep, and do not smoke. Avoid using alcohol or drugs to relax.  Strong support network. Spend time with family, friends, or other people you enjoy being around.Express your feelings and talk things over with someone you trust. Counseling or talktherapy with a mental health professional may be helpful if you are having difficulty managing stress on your own. Medicine is typically not recommended for the treatment of stress.Talk to your health care provider if you think you need medicine for symptoms of stress. Follow these instructions at home:  Keep all follow-up visits as directed by your health care provider.  Take all medicines as directed by your health care provider. Contact a health care provider if:  Your symptoms get worse or you start having new symptoms.  You feel overwhelmed by your problems and can no longer manage them on your own. Get help right away if:  You feel like hurting yourself or someone else. This information is not intended to replace advice given to you by your health care provider. Make sure you discuss any questions you have with your health care provider. Document Released: 01/28/2001 Document Revised: 01/10/2016 Document Reviewed: 03/29/2013 Elsevier Interactive Patient Education  2017 [employer].

## 2016-11-21 NOTE — Progress Notes (Signed)
Subjective:    Patient ID: Russell Sampson, male    DOB: [birth year], 66 y.o.   MRN: [id number]  Chief Complaint  Patient presents with  . Follow-up    celexa increased?    HPI:  Russell Sampson is a 66 y.o. male who  has a past medical history of Anxiety; Chicken pox; Difficult intubation (11/10/2016); Personal history of colonic polyps (10/2007); and Prostate cancer ([place]). and presents today for a follow up office visit.  Anxiety - Currently maintained on Celexa. Reports taking the medication as prescribed and denies adverse side effects. Notes that he continues to have increased levels of anxiety with the current regimen. Denies any chest pain, shortness or breath or symptoms of panic anxiety. Anxiety does occur on a daily basis.   No Known Allergies    Outpatient Medications Prior to Visit  Medication Sig Dispense Refill  . citalopram (CELEXA) 10 MG tablet Take 1 tablet (10 mg total) by mouth daily. 30 tablet 1  . HYDROcodone-acetaminophen (NORCO) 5-325 MG tablet Take 1-2 tablets by mouth every 6 (six) hours as needed for moderate pain or severe pain. 30 tablet 0  . sulfamethoxazole-trimethoprim (BACTRIM DS,SEPTRA DS) 800-160 MG tablet Take 1 tablet by mouth 2 (two) times daily. Start the day prior to foley removal appointment 6 tablet 0   Facility-Administered Medications Prior to Visit  Medication Dose Route Frequency Provider Last Rate Last Dose  . magnesium citrate solution 1 Bottle  1 Bottle Oral Once Raynelle Bring, MD      . sodium phosphate (FLEET) 7-19 GM/118ML enema 1 enema  1 enema Rectal Once Raynelle Bring, MD         Review of Systems  Constitutional: Negative for chills and fever.  Respiratory: Negative for chest tightness and shortness of breath.   Cardiovascular: Negative for chest pain.  Psychiatric/Behavioral: Negative for decreased concentration, dysphoric mood and sleep disturbance. The patient is nervous/anxious. The patient is not hyperactive.         Objective:    BP 126/64 (BP Location: Left Arm, Patient Position: Sitting, Cuff Size: Large)   Pulse 81   Temp 99 F (37.2 C) (Oral)   Resp 18   Ht 5' 10.5" (1.791 m)   Wt 192 lb (87.1 kg)   SpO2 97%   BMI 27.16 kg/m  Nursing note and vital signs reviewed.  Physical Exam  Constitutional: He is oriented to person, place, and time. He appears well-developed and well-nourished. No distress.  Cardiovascular: Normal rate, regular rhythm, normal heart sounds and intact distal pulses.   Pulmonary/Chest: Effort normal and breath sounds normal.  Neurological: He is alert and oriented to person, place, and time.  Skin: Skin is warm and dry.  Psychiatric: His behavior is normal. Judgment and thought content normal. His mood appears anxious.       Assessment & Plan:   Problem List Items Addressed This Visit      Other   Anxiety - Primary    Anxiety remains labile secondary to multiple stressors with no current side effects of medication. Increase Celexa to 20 mg daily. Encourage stress and stress management. Declines counseling. Continue to monitor.      Relevant Medications   citalopram (CELEXA) 20 MG tablet       I have discontinued Mr. Conde HYDROcodone-acetaminophen and sulfamethoxazole-trimethoprim. I have also changed his citalopram.   Meds ordered this encounter  Medications  . citalopram (CELEXA) 20 MG tablet    Sig: Take 1 tablet (20  mg total) by mouth daily.    Dispense:  30 tablet    Refill:  0    Fill at next refill request    Order Specific Question:   Supervising Provider    Answer:   Pricilla Holm A [[id number]]     Follow-up: Return in about 2 months (around 01/21/2017), or if symptoms worsen or fail to improve.  Mauricio Po, FNP

## 2016-11-21 NOTE — Assessment & Plan Note (Signed)
Anxiety remains labile secondary to multiple stressors with no current side effects of medication. Increase Celexa to 20 mg daily. Encourage stress and stress management. Declines counseling. Continue to monitor.

## 2016-12-24 ENCOUNTER — Telehealth: Payer: Self-pay | Admitting: Family

## 2016-12-24 MED ORDER — CITALOPRAM HYDROBROMIDE 40 MG PO TABS: 40.0000 mg | ORAL_TABLET | Freq: Every day | ORAL | 2 refills | Status: DC

## 2016-12-24 NOTE — Telephone Encounter (Signed)
Medication sent to pharmacy  

## 2016-12-24 NOTE — Telephone Encounter (Signed)
Spouse states they are seeing minimum improvement on celexa.  Believes dosage needs to go up.  Uses [facility] at [address].  States patient will be out of meds over the weekend.

## 2016-12-24 NOTE — Telephone Encounter (Signed)
Notified spouse  °

## 2017-02-26 NOTE — Anesthesia Postprocedure Evaluation (Signed)
Anesthesia Post Note  Patient: Russell Sampson  Procedure(s) Performed: Procedure(s) (LRB): XI ROBOTIC ASSISTED LAPAROSCOPIC RADICAL PROSTATECTOMY LEVEL 2 (N/A) PELVIC LYMPHADENECTOMY (Bilateral)     Anesthesia Post Evaluation  Last Vitals:  Vitals:   11/11/16 0548 11/11/16 0931  BP: (!) 142/80 133/77  Pulse: 87 85  Resp: 18 17  Temp: 36.5 C 36.4 C    Last Pain:  Vitals:   11/11/16 0932  TempSrc:   PainSc: 1                  Jhanvi Drakeford A.

## 2017-02-26 NOTE — Addendum Note (Signed)
Addendum  created 02/26/17 1555 by Josephine Igo, MD   Sign clinical note

## 2017-03-23 ENCOUNTER — Encounter: Payer: Self-pay | Admitting: Medical Oncology

## 2017-03-30 ENCOUNTER — Other Ambulatory Visit: Payer: Self-pay | Admitting: Family

## 2017-03-31 NOTE — Progress Notes (Signed)
Russell Sampson states he is doing well post prostatectomy. He continues with PT for urinary incontinence. He states his PSA remains undetectable and he is thrilled. He will continue follow up with Dr. Alinda Money.

## 2017-07-07 ENCOUNTER — Other Ambulatory Visit: Payer: Self-pay

## 2017-07-07 MED ORDER — CITALOPRAM HYDROBROMIDE 40 MG PO TABS: 40.0000 mg | ORAL_TABLET | Freq: Every day | ORAL | 0 refills | Status: DC

## 2017-07-29 ENCOUNTER — Ambulatory Visit: Payer: BLUE CROSS/BLUE SHIELD | Admitting: Family

## 2017-08-03 ENCOUNTER — Other Ambulatory Visit (INDEPENDENT_AMBULATORY_CARE_PROVIDER_SITE_OTHER): Payer: BLUE CROSS/BLUE SHIELD

## 2017-08-03 ENCOUNTER — Ambulatory Visit: Payer: BLUE CROSS/BLUE SHIELD | Admitting: Internal Medicine

## 2017-08-03 ENCOUNTER — Encounter: Payer: Self-pay | Admitting: Internal Medicine

## 2017-08-03 ENCOUNTER — Ambulatory Visit: Payer: BLUE CROSS/BLUE SHIELD | Admitting: Family

## 2017-08-03 VITALS — BP 126/72 | HR 76 | Temp 98.9°F | Ht 70.5 in | Wt 200.0 lb

## 2017-08-03 DIAGNOSIS — Z23 Encounter for immunization: Secondary | ICD-10-CM | POA: Diagnosis not present

## 2017-08-03 DIAGNOSIS — E559 Vitamin D deficiency, unspecified: Secondary | ICD-10-CM | POA: Diagnosis not present

## 2017-08-03 DIAGNOSIS — Z114 Encounter for screening for human immunodeficiency virus [HIV]: Secondary | ICD-10-CM

## 2017-08-03 DIAGNOSIS — F419 Anxiety disorder, unspecified: Secondary | ICD-10-CM

## 2017-08-03 DIAGNOSIS — E538 Deficiency of other specified B group vitamins: Secondary | ICD-10-CM

## 2017-08-03 DIAGNOSIS — Z1159 Encounter for screening for other viral diseases: Secondary | ICD-10-CM

## 2017-08-03 DIAGNOSIS — Z0000 Encounter for general adult medical examination without abnormal findings: Secondary | ICD-10-CM | POA: Diagnosis not present

## 2017-08-03 LAB — LIPID PANEL
Cholesterol: 157 mg/dL (ref 0–200)
HDL: 42.8 mg/dL (ref >39.00)
LDL Cholesterol: 93 mg/dL (ref 0–99)
NonHDL: 114.2
Total CHOL/HDL Ratio: 4
Triglycerides: 105 mg/dL (ref 0.0–149.0)
VLDL: 21 mg/dL (ref 0.0–40.0)

## 2017-08-03 LAB — BASIC METABOLIC PANEL
BUN: 18 mg/dL (ref 6–23)
CO2: 26 mEq/L (ref 19–32)
Calcium: 8.6 mg/dL (ref 8.4–10.5)
Chloride: 107 mEq/L (ref 96–112)
Creatinine, Ser: 0.77 mg/dL (ref 0.40–1.50)
GFR: 107.96 mL/min (ref >60.00)
Glucose, Bld: 89 mg/dL (ref 70–99)
Potassium: 3.9 mEq/L (ref 3.5–5.1)
Sodium: 140 mEq/L (ref 135–145)

## 2017-08-03 LAB — URINALYSIS, ROUTINE W REFLEX MICROSCOPIC
Bilirubin Urine: NEGATIVE
Hgb urine dipstick: NEGATIVE
Ketones, ur: NEGATIVE
Leukocytes, UA: NEGATIVE
Nitrite: NEGATIVE
Specific Gravity, Urine: >=1.03 — AB (ref 1.000–1.030)
Total Protein, Urine: NEGATIVE
Urine Glucose: NEGATIVE
Urobilinogen, UA: 0.2 (ref 0.0–1.0)
pH: 5.5 (ref 5.0–8.0)

## 2017-08-03 LAB — CBC WITH DIFFERENTIAL/PLATELET
Basophils Absolute: 0 10*3/uL (ref 0.0–0.1)
Basophils Relative: 0.7 % (ref 0.0–3.0)
Eosinophils Absolute: 0.3 10*3/uL (ref 0.0–0.7)
Eosinophils Relative: 4.5 % (ref 0.0–5.0)
HCT: 42.3 % (ref 39.0–52.0)
Hemoglobin: 13.9 g/dL (ref 13.0–17.0)
Lymphocytes Relative: 28.2 % (ref 12.0–46.0)
Lymphs Abs: 1.8 10*3/uL (ref 0.7–4.0)
MCHC: 32.9 g/dL (ref 30.0–36.0)
MCV: 84.1 fl (ref 78.0–100.0)
Monocytes Absolute: 0.5 10*3/uL (ref 0.1–1.0)
Monocytes Relative: 8 % (ref 3.0–12.0)
Neutro Abs: 3.7 10*3/uL (ref 1.4–7.7)
Neutrophils Relative %: 58.6 % (ref 43.0–77.0)
Platelets: 247 10*3/uL (ref 150.0–400.0)
RBC: 5.03 Mil/uL (ref 4.22–5.81)
RDW: 13.5 % (ref 11.5–15.5)
WBC: 6.3 10*3/uL (ref 4.0–10.5)

## 2017-08-03 LAB — HEPATIC FUNCTION PANEL
ALT: 15 U/L (ref 0–53)
AST: 15 U/L (ref 0–37)
Albumin: 3.8 g/dL (ref 3.5–5.2)
Alkaline Phosphatase: 76 U/L (ref 39–117)
Bilirubin, Direct: 0.1 mg/dL (ref 0.0–0.3)
Total Bilirubin: 0.6 mg/dL (ref 0.2–1.2)
Total Protein: 6.5 g/dL (ref 6.0–8.3)

## 2017-08-03 LAB — TSH: TSH: 2.97 u[IU]/mL (ref 0.35–4.50)

## 2017-08-03 MED ORDER — SERTRALINE HCL 100 MG PO TABS: 150.0000 mg | ORAL_TABLET | Freq: Every day | ORAL | 3 refills | Status: DC

## 2017-08-03 NOTE — Progress Notes (Signed)
Subjective:    Patient ID: Russell Sampson, male    DOB: 08/20/1950, 66 y.o.   MRN: [id number]  HPI  Here for wellness and f/u;  Overall doing ok;  Pt denies Chest pain, worsening SOB, DOE, wheezing, orthopnea, PND, worsening LE edema, palpitations, dizziness or syncope.  Pt denies neurological change such as new headache, facial or extremity weakness.  Pt denies polydipsia, polyuria, or low sugar symptoms. Pt states overall good compliance with treatment and medications, good tolerability, and has been trying to follow appropriate diet.  Pt denies worsening depressive symptoms, suicidal ideation or panic. No fever, night sweats, wt loss, loss of appetite, or other constitutional symptoms.  Pt states good ability with ADL's, has low fall risk, home safety reviewed and adequate, no other significant changes in hearing or vision, and only occasionally active with exercise.  Has f/u in Jan 2019 with Dr Borden/urology.  No other new complaints or interval hx except celexa not working as well for anxiety anymore Past Medical History:  Diagnosis Date  . Anxiety    when he was out of work  . Chicken pox   . Difficult intubation 11/10/2016   Anterior airway, decreased neck mobility  . Personal history of colonic polyps 10/2007   3-4 small adenomas  . Prostate cancer [facility])    Past Surgical History:  Procedure Laterality Date  . CLOSED REDUCTION FOREARM FRACTURE     1962  . COLONOSCOPY W/ POLYPECTOMY  10/22/2007   3-4 adenomas, max 73mm  . LYMPHADENECTOMY Bilateral 11/10/2016   Procedure: PELVIC LYMPHADENECTOMY;  Surgeon: Raynelle Bring, MD;  Location: [facility];  Service: Urology;  Laterality: Bilateral;  . PROSTATE BIOPSY     09/19/2016  . ROBOT ASSISTED LAPAROSCOPIC RADICAL PROSTATECTOMY N/A 11/10/2016   Procedure: XI ROBOTIC ASSISTED LAPAROSCOPIC RADICAL PROSTATECTOMY LEVEL 2;  Surgeon: Raynelle Bring, MD;  Location: [facility];  Service: Urology;  Laterality: N/A;    reports that  has never smoked. he has  never used smokeless tobacco. He reports that he does not drink alcohol or use drugs. family history includes Alzheimer's disease in his mother; Anxiety disorder in his sister; Prostate cancer in his father; Stroke in his mother. No Known Allergies No current outpatient medications on file prior to visit.   No current facility-administered medications on file prior to visit.    Review of Systems Constitutional: Negative for other unusual diaphoresis, sweats, appetite or weight changes HENT: Negative for other worsening hearing loss, ear pain, facial swelling, mouth sores or neck stiffness.   Eyes: Negative for other worsening pain, redness or other visual disturbance.  Respiratory: Negative for other stridor or swelling Cardiovascular: Negative for other palpitations or other chest pain  Gastrointestinal: Negative for worsening diarrhea or loose stools, blood in stool, distention or other pain Genitourinary: Negative for hematuria, flank pain or other change in urine volume.  Musculoskeletal: Negative for myalgias or other joint swelling.  Skin: Negative for other color change, or other wound or worsening drainage.  Neurological: Negative for other syncope or numbness. Hematological: Negative for other adenopathy or swelling Psychiatric/Behavioral: Negative for hallucinations, other worsening agitation, SI, self-injury, or new decreased concentration All other system neg per pt    Objective:   Physical Exam BP 126/72 (BP Location: Left Arm, Patient Position: Sitting, Cuff Size: Large)   Pulse 76   Temp 98.9 F (37.2 C) (Oral)   Ht 5' 10.5" (1.791 m)   Wt 200 lb (90.7 kg)   SpO2 99%   BMI  28.29 kg/m  VS noted,  Constitutional: Pt is oriented to person, place, and time. Appears well-developed and well-nourished, in no significant distress and comfortable Head: Normocephalic and atraumatic  Eyes: Conjunctivae and EOM are normal. Pupils are equal, round, and reactive to light Right  Ear: External ear normal without discharge Left Ear: External ear normal without discharge Nose: Nose without discharge or deformity Mouth/Throat: Oropharynx is without other ulcerations and moist  Neck: Normal range of motion. Neck supple. No JVD present. No tracheal deviation present or significant neck LA or mass Cardiovascular: Normal rate, regular rhythm, normal heart sounds and intact distal pulses.   Pulmonary/Chest: WOB normal and breath sounds without rales or wheezing  Abdominal: Soft. Bowel sounds are normal. NT. No HSM  Musculoskeletal: Normal range of motion. Exhibits no edema Lymphadenopathy: Has no other cervical adenopathy.  Neurological: Pt is alert and oriented to person, place, and time. Pt has normal reflexes. No cranial nerve deficit. Motor grossly intact, Gait intact Skin: Skin is warm and dry. No rash noted or new ulcerations Psychiatric:  Has nervous mood and affect. Behavior is normal without agitation No other exam findings    Assessment & Plan:

## 2017-08-03 NOTE — Patient Instructions (Addendum)
You had the Tdap tetanus shot today  OK to stop the celexa  Please take all new medication as prescribed - the zoloft 100 mg pills, but starting with 50 mg per day (half pill) for 5 days, then 100 mg per day for 1 wk, then 150 mg thereafter (which would be one and 1/2 pills per day)  Please continue all other medications as before, and refills have been done if requested.  Please have the pharmacy call with any other refills you may need.  Please continue your efforts at being more active, low cholesterol diet, and weight control.  You are otherwise up to date with prevention measures today.  Please keep your appointments with your specialists as you may have planned - Dr Alinda Money in jan 2019  Please go to the LAB in the Basement (turn left off the elevator) for the tests to be done today  You will be contacted by phone if any changes need to be made immediately.  Otherwise, you will receive a letter about your results with an explanation, but please check with MyChart first.  Please remember to sign up for MyChart if you have not done so, as this will be important to you in the future with finding out test results, communicating by private email, and scheduling acute appointments online when needed.  Please return in 1 year for your yearly visit, or sooner if needed, with Lab testing done 3-5 days before

## 2017-08-04 ENCOUNTER — Encounter: Payer: Self-pay | Admitting: Internal Medicine

## 2017-08-04 LAB — HEPATITIS C ANTIBODY
Hepatitis C Ab: NONREACTIVE
SIGNAL TO CUT-OFF: 0.08 (ref <1.00)

## 2017-08-04 LAB — HIV ANTIBODY (ROUTINE TESTING W REFLEX): HIV 1&2 Ab, 4th Generation: NONREACTIVE

## 2017-08-04 NOTE — Assessment & Plan Note (Signed)

## 2017-08-04 NOTE — Assessment & Plan Note (Signed)
[place] for change celexa to zoloft 150 qd asd,  to f/u any worsening symptoms or concerns

## 2018-03-03 IMAGING — DX DG CHEST 2V
2 series · 2 of 2 positions shown · non-contrast
Comparison: 07/06/2011, 06/13/2011

CLINICAL DATA: Preop evaluation for prostatectomy high a due to
prostate cancer

EXAM:
CHEST  2 VIEW

[chest pa]
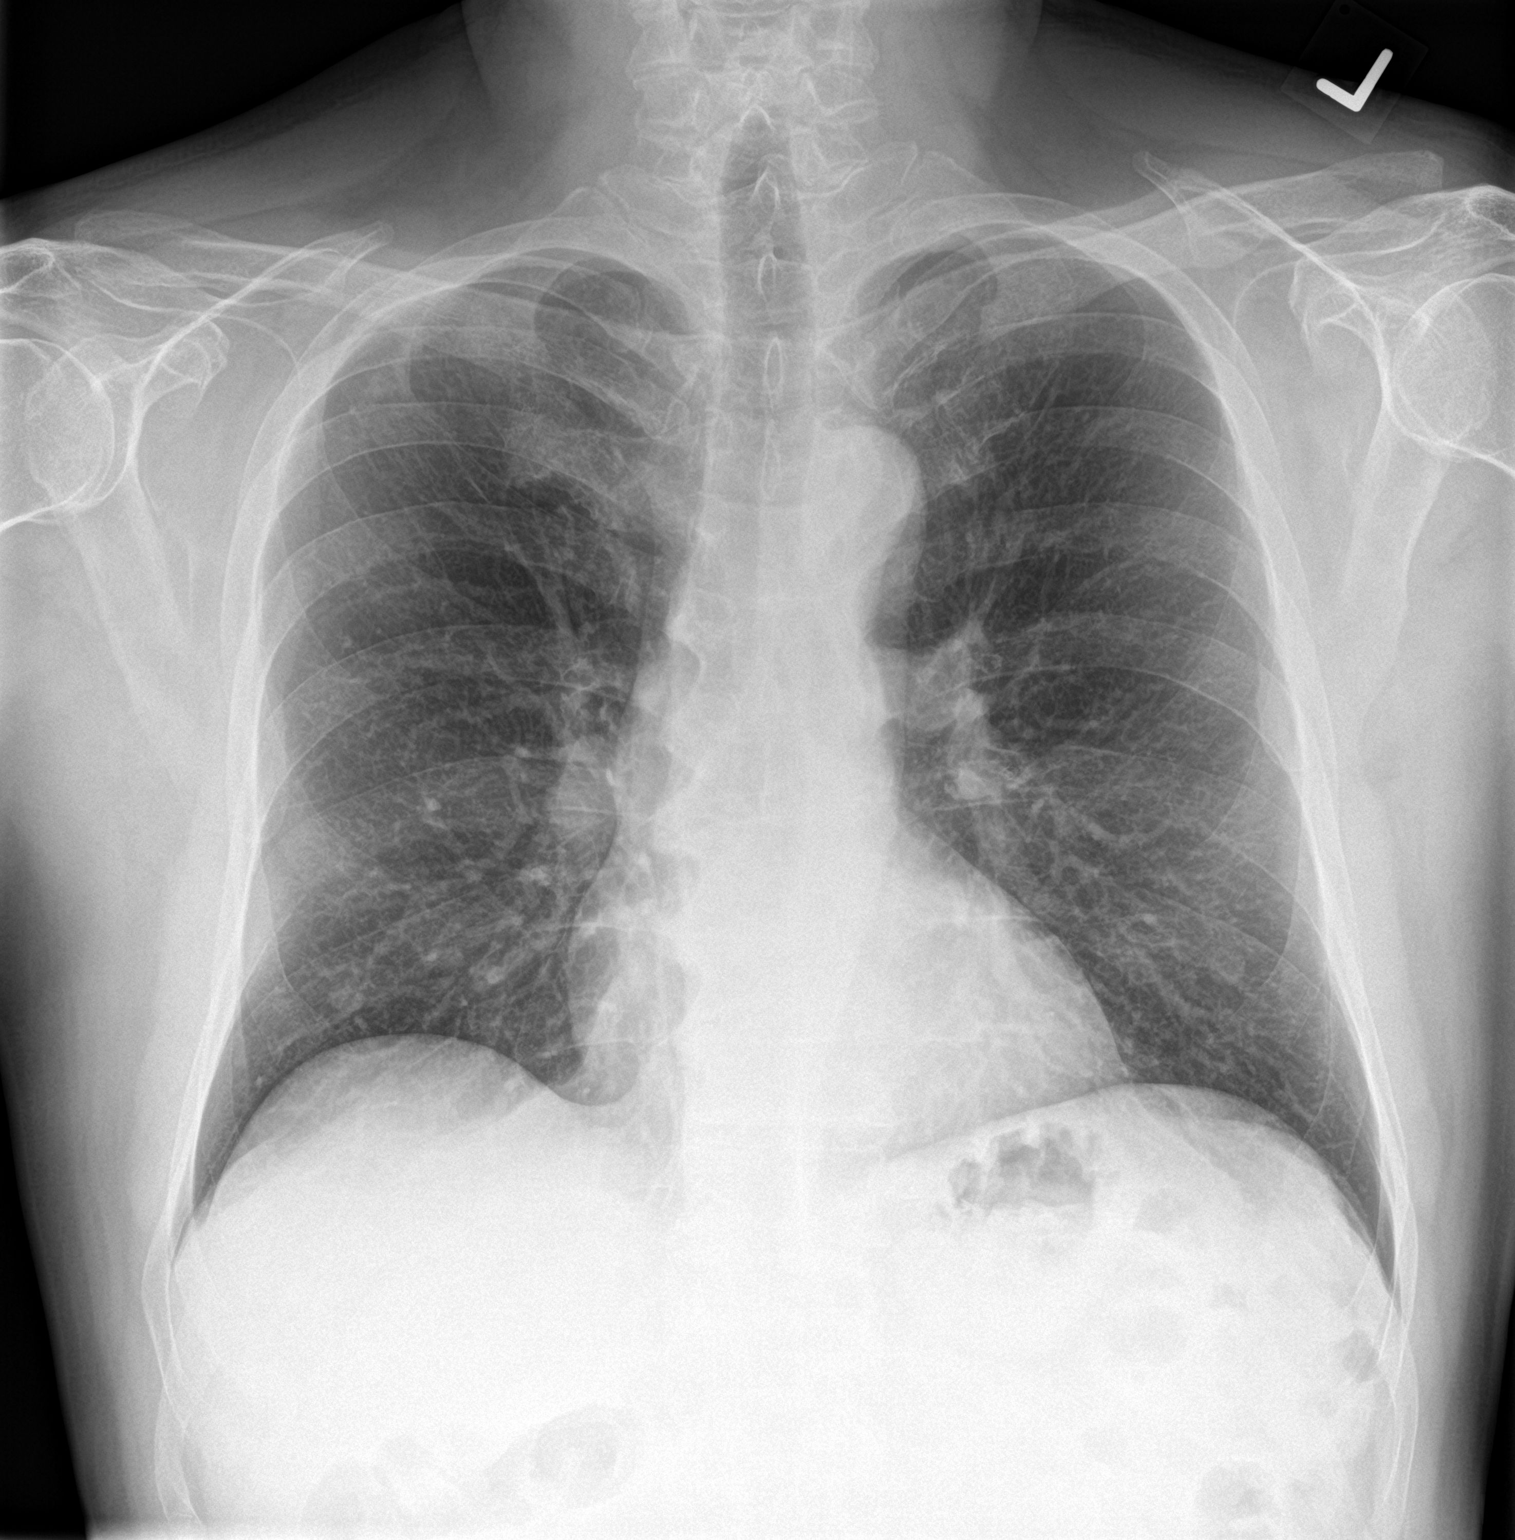

[chest lat]
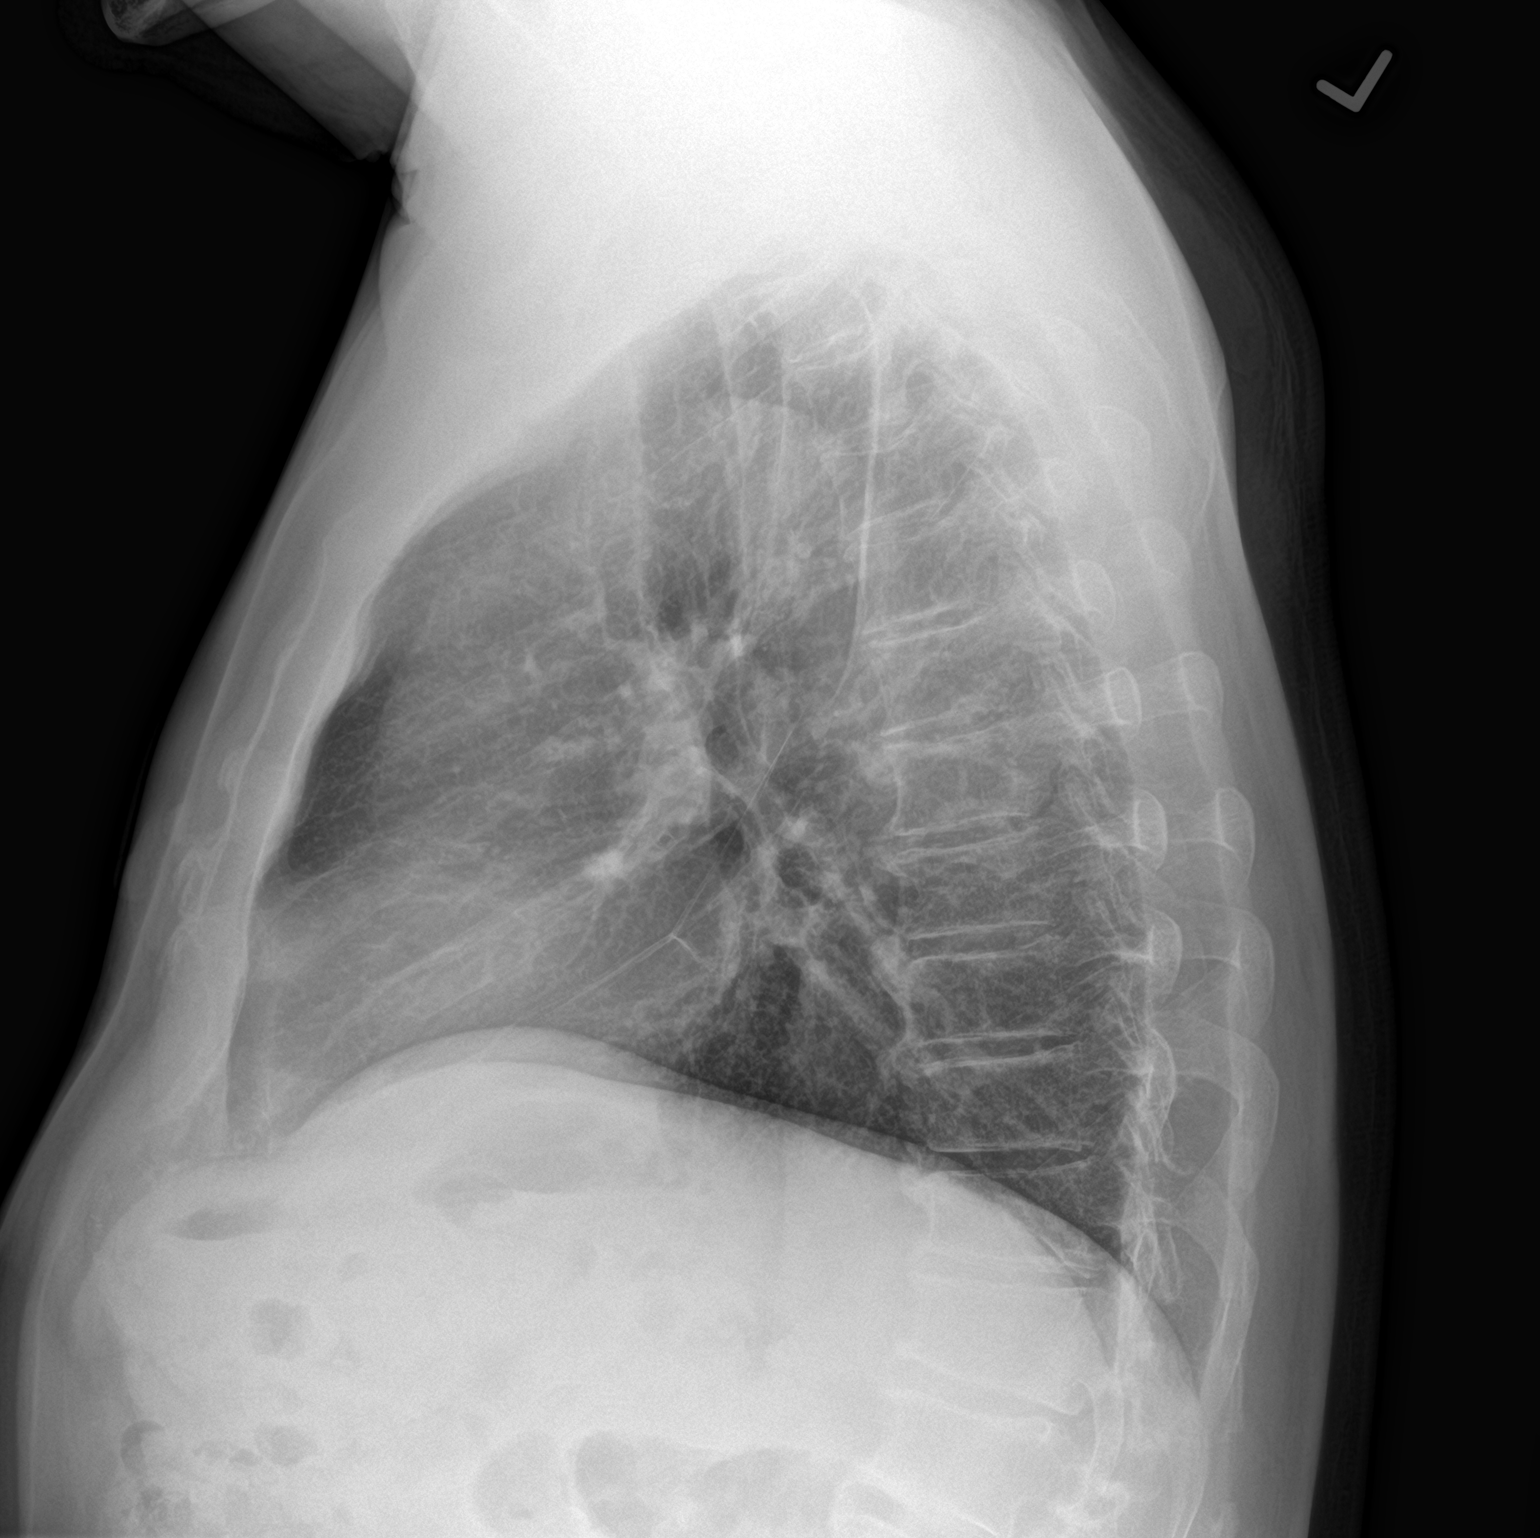

[2 of 2 positions shown; findings below may reference images not displayed]

FINDINGS: Normal heart size, mediastinal contours, and pulmonary vascularity.

Bronchitic changes without pulmonary infiltrate, pleural effusion or
pneumothorax.

Potential BILATERAL nipple shadows.

Prominent first costochondral junctions bilaterally.

Degenerative disc disease changes thoracic spine.

Mild diffuse osseous demineralization.
IMPRESSION: Bronchitic changes with question BILATERAL nipple shadow ; repeat PA
chest radiograph with nipple markers recommended to exclude
pulmonary nodules.

These results will be called to the ordering clinician or
representative by the Radiologist Assistant, and communication
documented in the PACS or zVision Dashboard.

## 2018-03-04 IMAGING — CR DG CHEST 2V
2 series · 2 of 2 positions shown · non-contrast
Comparison: November 03, 2016

CLINICAL DATA: Preoperative prostate surgery. Questionable nipple
shadows on recent chest radiograph.

EXAM:
CHEST  2 VIEW

[w chest pa]
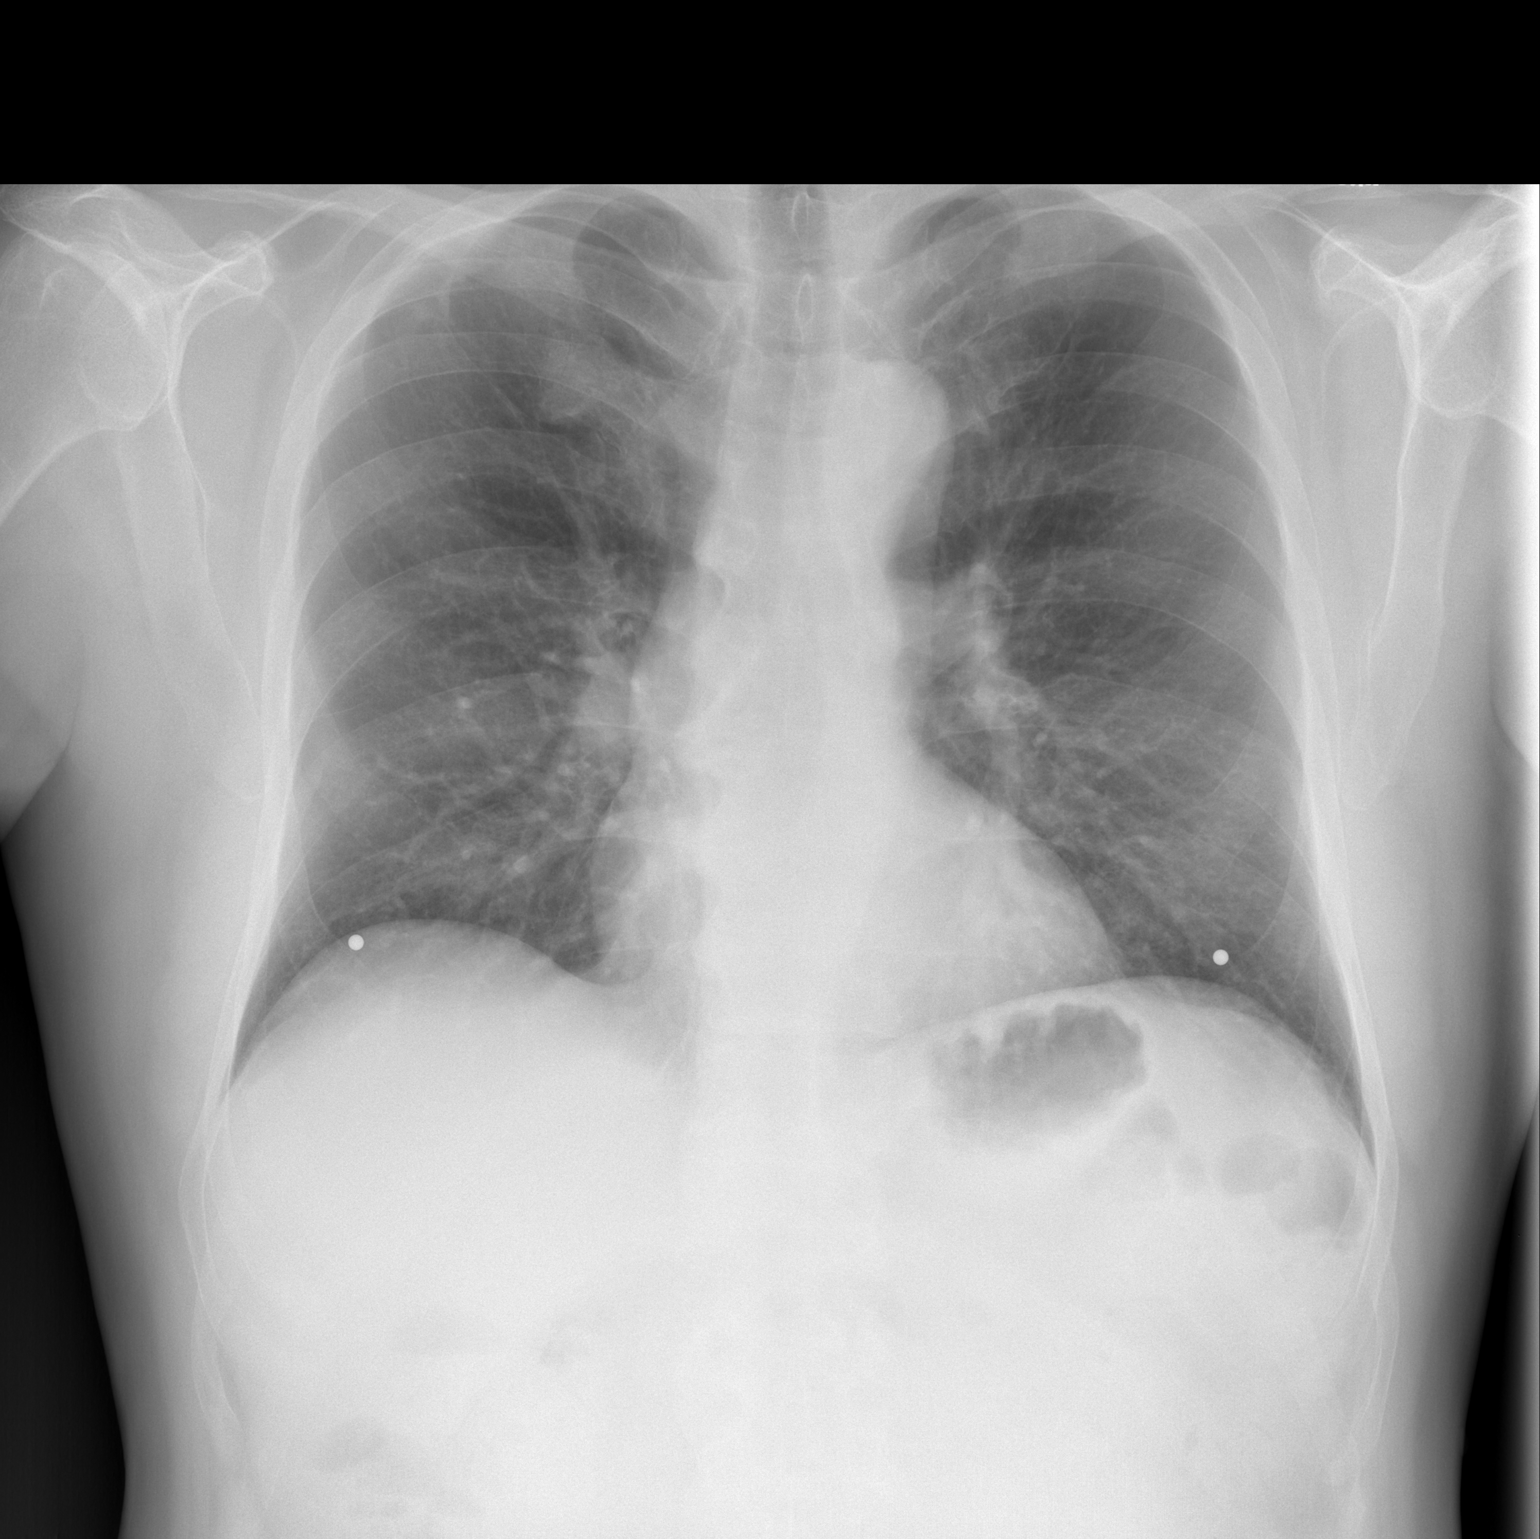

[w chest lat]
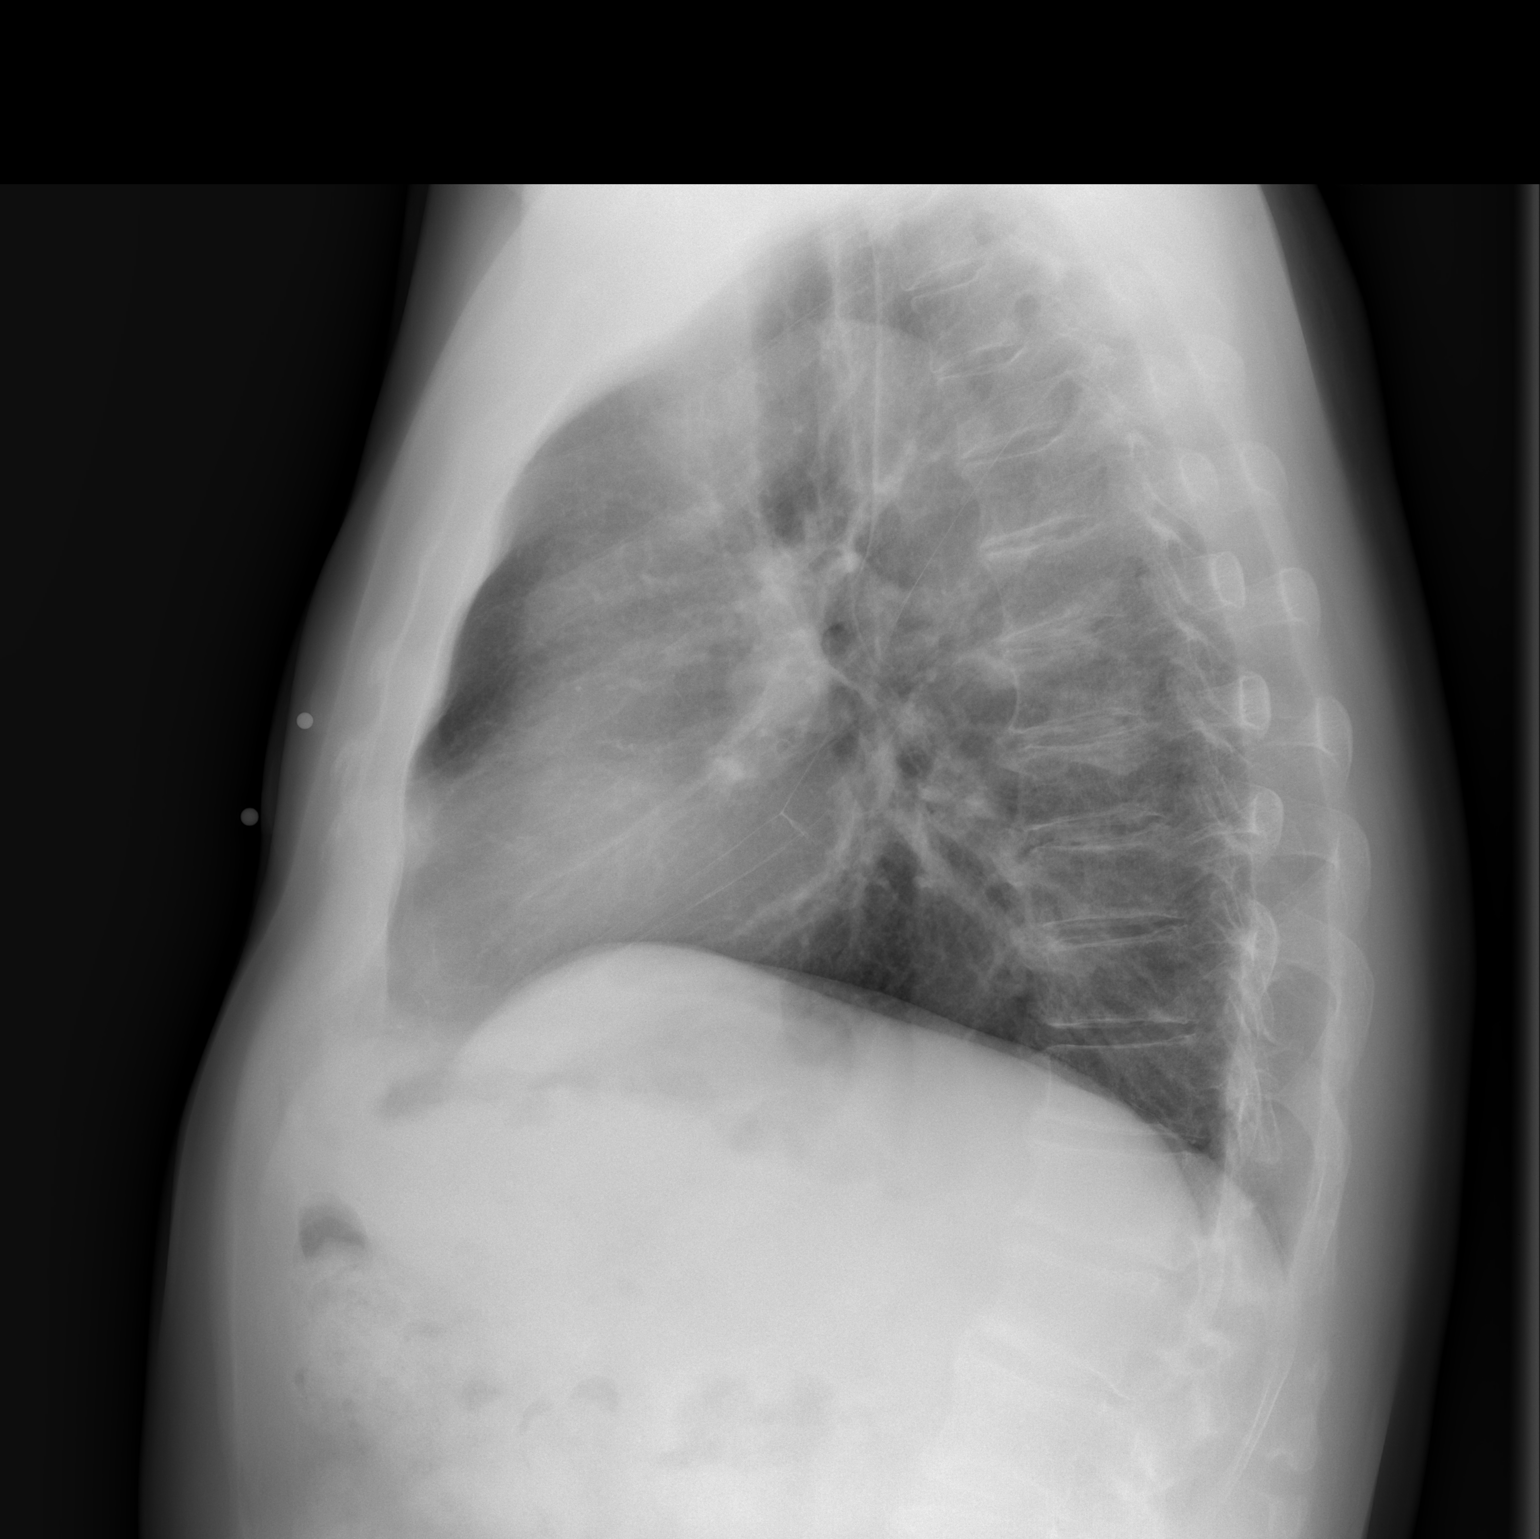

[2 of 2 positions shown; findings below may reference images not displayed]

FINDINGS: Current study obtained with nipple markers in place confirm that the
areas of nodularity in the bases seen 1 day prior represent nipple
shadows. There is no edema or consolidation. Heart size and
pulmonary vascularity are normal. No adenopathy. There is
degenerative change in the thoracic spine.
IMPRESSION: No edema or consolidation. Nipple shadows noted 1 day prior with
nipple markers confirming that the opacity seen 1 day prior indeed
represent nipple shadows.

## 2018-03-08 DIAGNOSIS — H25012 Cortical age-related cataract, left eye: Secondary | ICD-10-CM | POA: Diagnosis not present

## 2018-03-08 DIAGNOSIS — H2512 Age-related nuclear cataract, left eye: Secondary | ICD-10-CM | POA: Diagnosis not present

## 2018-03-08 DIAGNOSIS — Z961 Presence of intraocular lens: Secondary | ICD-10-CM | POA: Diagnosis not present

## 2018-03-08 DIAGNOSIS — H35371 Puckering of macula, right eye: Secondary | ICD-10-CM | POA: Diagnosis not present

## 2018-03-17 DIAGNOSIS — C61 Malignant neoplasm of prostate: Secondary | ICD-10-CM | POA: Diagnosis not present

## 2018-03-24 DIAGNOSIS — N5201 Erectile dysfunction due to arterial insufficiency: Secondary | ICD-10-CM | POA: Diagnosis not present

## 2018-03-24 DIAGNOSIS — C61 Malignant neoplasm of prostate: Secondary | ICD-10-CM | POA: Diagnosis not present

## 2018-03-24 DIAGNOSIS — N3941 Urge incontinence: Secondary | ICD-10-CM | POA: Diagnosis not present

## 2018-04-28 DIAGNOSIS — H2512 Age-related nuclear cataract, left eye: Secondary | ICD-10-CM | POA: Diagnosis not present

## 2018-04-28 DIAGNOSIS — H25812 Combined forms of age-related cataract, left eye: Secondary | ICD-10-CM | POA: Diagnosis not present

## 2018-05-18 DIAGNOSIS — Z23 Encounter for immunization: Secondary | ICD-10-CM | POA: Diagnosis not present

## 2018-07-04 DIAGNOSIS — Z23 Encounter for immunization: Secondary | ICD-10-CM | POA: Diagnosis not present

## 2018-07-30 ENCOUNTER — Encounter: Payer: Self-pay | Admitting: Family

## 2018-07-30 ENCOUNTER — Other Ambulatory Visit: Payer: Self-pay

## 2018-07-30 ENCOUNTER — Ambulatory Visit (INDEPENDENT_AMBULATORY_CARE_PROVIDER_SITE_OTHER): Payer: Medicare Other | Admitting: Family

## 2018-07-30 VITALS — BP 140/72 | HR 93 | Temp 97.8°F | Ht 70.5 in | Wt 198.1 lb

## 2018-07-30 DIAGNOSIS — J209 Acute bronchitis, unspecified: Secondary | ICD-10-CM | POA: Diagnosis not present

## 2018-07-30 MED ORDER — PREDNISONE 20 MG PO TABS: 20.0000 mg | ORAL_TABLET | Freq: Every day | ORAL | 0 refills | Status: DC

## 2018-07-30 MED ORDER — DOXYCYCLINE HYCLATE 100 MG PO TABS: 100.0000 mg | ORAL_TABLET | Freq: Two times a day (BID) | ORAL | 0 refills | Status: DC

## 2018-07-30 MED ORDER — HYDROCODONE-HOMATROPINE 5-1.5 MG/5ML PO SYRP: 5.0000 mL | ORAL_SOLUTION | Freq: Four times a day (QID) | ORAL | 0 refills | Status: DC | PRN

## 2018-07-30 NOTE — Progress Notes (Signed)
Russell Sampson is a 67 y.o. male with the following history as recorded in EpicCare:  Patient Active Problem List   Diagnosis Date Noted  . Prostate cancer ([place]) 10/13/2016  . Routine general medical examination at a health care facility 04/16/2015  . Anxiety 01/30/2011  . Personal history of adenomatous colonic polyps 10/22/2007    Current Outpatient Medications  Medication Sig Dispense Refill  . Multiple Vitamin (THERA) TABS Take by mouth.    . vitamin B-12 (CYANOCOBALAMIN) 500 MCG tablet Take by mouth.    . doxycycline (VIBRA-TABS) 100 MG tablet Take 1 tablet (100 mg total) by mouth 2 (two) times daily. 20 tablet 0  . HYDROcodone-homatropine (HYCODAN) 5-1.5 MG/5ML syrup Take 5 mLs by mouth every 6 (six) hours as needed for cough. 75 mL 0  . predniSONE (DELTASONE) 20 MG tablet Take 1 tablet (20 mg total) by mouth daily with breakfast. 5 tablet 0   No current facility-administered medications for this visit.     Allergies: Patient has no known allergies.  Past Medical History:  Diagnosis Date  . Anxiety    when he was out of work  . Chicken pox   . Difficult intubation 11/10/2016   Anterior airway, decreased neck mobility  . Personal history of colonic polyps 10/2007   3-4 small adenomas  . Prostate cancer [facility])     Past Surgical History:  Procedure Laterality Date  . CLOSED REDUCTION FOREARM FRACTURE     1962  . COLONOSCOPY W/ POLYPECTOMY  10/22/2007   3-4 adenomas, max 14mm  . LYMPHADENECTOMY Bilateral 11/10/2016   Procedure: PELVIC LYMPHADENECTOMY;  Surgeon: Raynelle Bring, MD;  Location: [facility];  Service: Urology;  Laterality: Bilateral;  . PROSTATE BIOPSY     09/19/2016  . ROBOT ASSISTED LAPAROSCOPIC RADICAL PROSTATECTOMY N/A 11/10/2016   Procedure: XI ROBOTIC ASSISTED LAPAROSCOPIC RADICAL PROSTATECTOMY LEVEL 2;  Surgeon: Raynelle Bring, MD;  Location: [facility];  Service: Urology;  Laterality: N/A;    Family History  Problem Relation Age of Onset  . Prostate cancer Father    . Alzheimer's disease Mother   . Stroke Mother   . Anxiety disorder Sister   . Colon cancer Neg Hx     Social History   Tobacco Use  . Smoking status: Never Smoker  . Smokeless tobacco: Never Used  Substance Use Topics  . Alcohol use: No    Subjective:  Patient presents today with concerns for cough/ congestion/ nasal drainage x 1-2 weeks; wife accompanies patient today; + coughing fit; is prone to bronchitis; symptoms are worse at night; thinks symptoms started with allergy flare and has now moved into his chest; Using OTC elderberry honey syrup;      Objective:  Vitals:   07/30/18 1034  BP: 140/72  Pulse: 93  Temp: 97.8 F (36.6 C)  TempSrc: Oral  SpO2: 97%  Weight: 198 lb 1.3 oz (89.8 kg)  Height: 5' 10.5" (1.791 m)    General: Well developed, well nourished, in no acute distress  Skin : Warm and dry.  Head: Normocephalic and atraumatic  Eyes: Sclera and conjunctiva clear; pupils round and reactive to light; extraocular movements intact  Ears: External normal; canals clear; tympanic membranes normal  Oropharynx: Pink, supple. No suspicious lesions  Neck: Supple without thyromegaly, adenopathy  Lungs: Respirations unlabored; wheezing in upper lobes CVS exam: normal rate and regular rhythm.  Neurologic: Alert and oriented; speech intact; face symmetrical; moves all extremities well; CNII-XII intact without focal deficit   Assessment:  1. Acute bronchitis, unspecified organism     Plan:  Rx for Doxycycline 100 mg bid x 10 days, Prednisone 20 mg qd x 5 days; Rx for Hycodan to use at night as needed; increase fluids, rest and follow-up worse, no better.   No follow-ups on file.  No orders of the defined types were placed in this encounter.   Requested Prescriptions   Signed Prescriptions Disp Refills  . doxycycline (VIBRA-TABS) 100 MG tablet 20 tablet 0    Sig: Take 1 tablet (100 mg total) by mouth 2 (two) times daily.  . predniSONE (DELTASONE) 20 MG tablet 5  tablet 0    Sig: Take 1 tablet (20 mg total) by mouth daily with breakfast.  . HYDROcodone-homatropine (HYCODAN) 5-1.5 MG/5ML syrup 75 mL 0    Sig: Take 5 mLs by mouth every 6 (six) hours as needed for cough.

## 2018-08-13 ENCOUNTER — Telehealth: Payer: Self-pay

## 2018-08-13 NOTE — Telephone Encounter (Signed)
Copied from [place] [id number]. Topic: Appointment Scheduling - Scheduling Inquiry for Clinic >> Aug 12, 2018  4:33 PM Sheran Luz wrote: Reason for CRM: Patients wife, Jackelyn Poling, calling to schedule patient an appointment for anxiety. Jackelyn Poling states that although Dr Jenny Reichmann is patients PCP, patient feels more comfortable discussing this with a male provider. Michaell Cowing that it is usually something that would need to be discussed in OV with PCP. Please contact [name] if patient could be seen by Jodi Mourning instead for this appointment. She states this does not need to be immediate, just sometime in the near future.   Cb# [phone]

## 2018-08-13 NOTE — Telephone Encounter (Signed)
We typically recommend that patient's see their own PCP for chronic care needs. It would be best for him to see Dr. Jenny Reichmann.

## 2018-08-13 NOTE — Telephone Encounter (Signed)
Spoke with Mrs. Aiken and info given.

## 2018-08-26 ENCOUNTER — Encounter: Payer: Self-pay | Admitting: Internal Medicine

## 2018-08-26 ENCOUNTER — Encounter

## 2018-08-26 ENCOUNTER — Other Ambulatory Visit (INDEPENDENT_AMBULATORY_CARE_PROVIDER_SITE_OTHER): Payer: Medicare Other

## 2018-08-26 ENCOUNTER — Ambulatory Visit (INDEPENDENT_AMBULATORY_CARE_PROVIDER_SITE_OTHER): Payer: Medicare Other | Admitting: Internal Medicine

## 2018-08-26 VITALS — BP 126/74 | HR 63 | Temp 97.6°F | Ht 70.5 in | Wt 197.0 lb

## 2018-08-26 DIAGNOSIS — E559 Vitamin D deficiency, unspecified: Secondary | ICD-10-CM

## 2018-08-26 DIAGNOSIS — R413 Other amnesia: Secondary | ICD-10-CM | POA: Insufficient documentation

## 2018-08-26 DIAGNOSIS — E669 Obesity, unspecified: Secondary | ICD-10-CM | POA: Insufficient documentation

## 2018-08-26 DIAGNOSIS — C61 Malignant neoplasm of prostate: Secondary | ICD-10-CM

## 2018-08-26 DIAGNOSIS — E785 Hyperlipidemia, unspecified: Secondary | ICD-10-CM

## 2018-08-26 DIAGNOSIS — F419 Anxiety disorder, unspecified: Secondary | ICD-10-CM | POA: Diagnosis not present

## 2018-08-26 DIAGNOSIS — Z6836 Body mass index (BMI) 36.0-36.9, adult: Secondary | ICD-10-CM

## 2018-08-26 HISTORY — DX: Obesity, unspecified: E66.9

## 2018-08-26 LAB — URINALYSIS, ROUTINE W REFLEX MICROSCOPIC
Bilirubin Urine: NEGATIVE
Hgb urine dipstick: NEGATIVE
Ketones, ur: NEGATIVE
Leukocytes, UA: NEGATIVE
Nitrite: NEGATIVE
RBC / HPF: NONE SEEN (ref 0–?)
Specific Gravity, Urine: 1.02 (ref 1.000–1.030)
Total Protein, Urine: NEGATIVE
Urine Glucose: NEGATIVE
Urobilinogen, UA: 0.2 (ref 0.0–1.0)
pH: 5.5 (ref 5.0–8.0)

## 2018-08-26 LAB — CBC WITH DIFFERENTIAL/PLATELET
Basophils Absolute: 0.1 10*3/uL (ref 0.0–0.1)
Basophils Relative: 0.8 % (ref 0.0–3.0)
Eosinophils Absolute: 0.5 10*3/uL (ref 0.0–0.7)
Eosinophils Relative: 5.8 % — ABNORMAL HIGH (ref 0.0–5.0)
HCT: 42.6 % (ref 39.0–52.0)
Hemoglobin: 14 g/dL (ref 13.0–17.0)
Lymphocytes Relative: 26.7 % (ref 12.0–46.0)
Lymphs Abs: 2.1 10*3/uL (ref 0.7–4.0)
MCHC: 32.9 g/dL (ref 30.0–36.0)
MCV: 83.9 fl (ref 78.0–100.0)
Monocytes Absolute: 0.6 10*3/uL (ref 0.1–1.0)
Monocytes Relative: 7.3 % (ref 3.0–12.0)
Neutro Abs: 4.6 10*3/uL (ref 1.4–7.7)
Neutrophils Relative %: 59.4 % (ref 43.0–77.0)
Platelets: 253 10*3/uL (ref 150.0–400.0)
RBC: 5.08 Mil/uL (ref 4.22–5.81)
RDW: 13.9 % (ref 11.5–15.5)
WBC: 7.8 10*3/uL (ref 4.0–10.5)

## 2018-08-26 LAB — LIPID PANEL
Cholesterol: 182 mg/dL (ref 0–200)
HDL: 38.9 mg/dL — ABNORMAL LOW (ref >39.00)
LDL Cholesterol: 106 mg/dL — ABNORMAL HIGH (ref 0–99)
NonHDL: 143.32
Total CHOL/HDL Ratio: 5
Triglycerides: 186 mg/dL — ABNORMAL HIGH (ref 0.0–149.0)
VLDL: 37.2 mg/dL (ref 0.0–40.0)

## 2018-08-26 LAB — BASIC METABOLIC PANEL
BUN: 16 mg/dL (ref 6–23)
CO2: 27 mEq/L (ref 19–32)
Calcium: 8.9 mg/dL (ref 8.4–10.5)
Chloride: 104 mEq/L (ref 96–112)
Creatinine, Ser: 0.83 mg/dL (ref 0.40–1.50)
GFR: 98.67 mL/min (ref >60.00)
Glucose, Bld: 88 mg/dL (ref 70–99)
Potassium: 3.8 mEq/L (ref 3.5–5.1)
Sodium: 139 mEq/L (ref 135–145)

## 2018-08-26 LAB — HEPATIC FUNCTION PANEL
ALT: 16 U/L (ref 0–53)
AST: 16 U/L (ref 0–37)
Albumin: 4 g/dL (ref 3.5–5.2)
Alkaline Phosphatase: 76 U/L (ref 39–117)
Bilirubin, Direct: 0.1 mg/dL (ref 0.0–0.3)
Total Bilirubin: 0.4 mg/dL (ref 0.2–1.2)
Total Protein: 6.3 g/dL (ref 6.0–8.3)

## 2018-08-26 LAB — TSH: TSH: 2.94 u[IU]/mL (ref 0.35–4.50)

## 2018-08-26 LAB — VITAMIN D 25 HYDROXY (VIT D DEFICIENCY, FRACTURES): VITD: 41.28 ng/mL (ref 30.00–100.00)

## 2018-08-26 LAB — PSA: PSA: 0 ng/mL — ABNORMAL LOW (ref 0.10–4.00)

## 2018-08-26 LAB — VITAMIN B12: Vitamin B-12: 716 pg/mL (ref 211–911)

## 2018-08-26 MED ORDER — FLUOXETINE HCL 20 MG PO CAPS: 20.0000 mg | ORAL_CAPSULE | Freq: Every day | ORAL | 3 refills | Status: DC

## 2018-08-26 NOTE — Progress Notes (Signed)
Subjective:    Patient ID: Russell Sampson, male    DOB: 25-Sep-1950, 68 y.o.   MRN: [id number]  HPI  Here for yearly f/u;  Overall doing ok;  Pt denies Chest pain, worsening SOB, DOE, wheezing, orthopnea, PND, worsening LE edema, palpitations, dizziness or syncope.  Pt denies neurological change such as new headache, facial or extremity weakness.  Pt denies polydipsia, polyuria, or low sugar symptoms. Pt states overall good compliance with treatment and medications, good tolerability, and has been trying to follow appropriate diet.  Pt denies worsening depressive symptoms, suicidal ideation or panic, but has been more anxious.  No fever, night sweats, wt loss, loss of appetite, or other constitutional symptoms.  Pt states good ability with ADL's, has low fall risk, home safety reviewed and adequate, no other significant changes in hearing or vision, and only occasionally active with exercise. vCould not tolerate zoloft, too sedating, now more anxiety, agitation, worrying getting worse per wife in the past few months.  Celexa has worked well in the past, but willing to try other.  Prostate ca surgury 2018, s/p total prostatectomy, see urology Dr Alinda Money.  Strong FH of dementia; ? Anxiety but is repeating things, re-asking things, less able to understand, wife is retired Scientist, product/process development, persistent chronic x 6 mo.   Past Medical History:  Diagnosis Date  . Anxiety    when he was out of work  . Chicken pox   . Difficult intubation 11/10/2016   Anterior airway, decreased neck mobility  . Personal history of colonic polyps 10/2007   3-4 small adenomas  . Prostate cancer [facility])    Past Surgical History:  Procedure Laterality Date  . CLOSED REDUCTION FOREARM FRACTURE     1962  . COLONOSCOPY W/ POLYPECTOMY  10/22/2007   3-4 adenomas, max 78mm  . LYMPHADENECTOMY Bilateral 11/10/2016   Procedure: PELVIC LYMPHADENECTOMY;  Surgeon: Raynelle Bring, MD;  [facility];  Service: Urology;  Laterality: Bilateral;  .  PROSTATE BIOPSY     09/19/2016  . ROBOT ASSISTED LAPAROSCOPIC RADICAL PROSTATECTOMY N/A 11/10/2016   Procedure: XI ROBOTIC ASSISTED LAPAROSCOPIC RADICAL PROSTATECTOMY LEVEL 2;  Surgeon: Raynelle Bring, MD;  Location: [facility];  Service: Urology;  Laterality: N/A;    reports that he has never smoked. He has never used smokeless tobacco. He reports that he does not drink alcohol or use drugs. family history includes Alzheimer's disease in his mother; Anxiety disorder in his sister; Prostate cancer in his father; Stroke in his mother. No Known Allergies Current Outpatient Medications on File Prior to Visit  Medication Sig Dispense Refill  . Multiple Vitamin (THERA) TABS Take by mouth.    . vitamin B-12 (CYANOCOBALAMIN) 500 MCG tablet Take by mouth.     No current facility-administered medications on file prior to visit.    Review of Systems  Constitutional: Negative for other unusual diaphoresis or sweats HENT: Negative for ear discharge or swelling Eyes: Negative for other worsening visual disturbances Respiratory: Negative for stridor or other swelling  Gastrointestinal: Negative for worsening distension or other blood Genitourinary: Negative for retention or other urinary change Musculoskeletal: Negative for other MSK pain or swelling Skin: Negative for color change or other new lesions Neurological: Negative for worsening tremors and other numbness  Psychiatric/Behavioral: Negative for worsening agitation or other fatigue ALl other system neg per pt    Objective:   Physical Exam BP 126/74   Pulse 63   Temp 97.6 F (36.4 C) (Oral)   Ht 5'  10.5" (1.791 m)   Wt 197 lb (89.4 kg)   SpO2 97%   BMI 27.87 kg/m  VS noted,  Constitutional: Pt appears in NAD HENT: Head: NCAT.  Right Ear: External ear normal.  Left Ear: External ear normal.  Eyes: . Pupils are equal, round, and reactive to light. Conjunctivae and EOM are normal Nose: without d/c or deformity Neck: Neck supple. Gross  normal ROM Cardiovascular: Normal rate and regular rhythm.   Pulmonary/Chest: Effort normal and breath sounds without rales or wheezing.  Abd:  Soft, NT, ND, + BS, no organomegaly Neurological: Pt is alert. At baseline orientation, motor grossly intact Skin: Skin is warm. No rashes, other new lesions, no LE edema Psychiatric: Pt behavior is normal without agitation , 2+ nervous No other exam findings Lab Results  Component Value Date   WBC 7.8 08/26/2018   HGB 14.0 08/26/2018   HCT 42.6 08/26/2018   PLT 253.0 08/26/2018   GLUCOSE 88 08/26/2018   CHOL 182 08/26/2018   TRIG 186.0 (H) 08/26/2018   HDL 38.90 (L) 08/26/2018   LDLCALC 106 (H) 08/26/2018   ALT 16 08/26/2018   AST 16 08/26/2018   NA 139 08/26/2018   K 3.8 08/26/2018   CL 104 08/26/2018   CREATININE 0.83 08/26/2018   BUN 16 08/26/2018   CO2 27 08/26/2018   TSH 2.94 08/26/2018   PSA 0.00 Repeated and verified X2. (L) 08/26/2018       Assessment & Plan:

## 2018-08-26 NOTE — Assessment & Plan Note (Signed)
?   Pseudodementia /anxiety vs other - for MRI, b12, and neurology referral

## 2018-08-26 NOTE — Assessment & Plan Note (Signed)
Also for f/u psa today,  to f/u any worsening symptoms or concerns

## 2018-08-26 NOTE — Assessment & Plan Note (Signed)
For increased activity, lower calories

## 2018-08-26 NOTE — Patient Instructions (Addendum)
Please take all new medication as prescribed - the prozac 20 mg per day  Please continue all other medications as before, and refills have been done if requested.  Please have the pharmacy call with any other refills you may need.  Please continue your efforts at being more active, low cholesterol diet, and weight control.  You are otherwise up to date with prevention measures today.  Please keep your appointments with your specialists as you may have planned  You will be contacted regarding the referral for: MRI, and Neurology  Please go to the LAB in the Basement (turn left off the elevator) for the tests to be done today  You will be contacted by phone if any changes need to be made immediately.  Otherwise, you will receive a letter about your results with an explanation, but please check with MyChart first.  Please remember to sign up for MyChart if you have not done so, as this will be important to you in the future with finding out test results, communicating by private email, and scheduling acute appointments online when needed.  Please return in 6 months, or sooner if needed, with Lab testing done 3-5 days before

## 2018-08-26 NOTE — Assessment & Plan Note (Signed)
For prozac 20 qd,  to f/u any worsening symptoms or concerns, declines referral for counseling

## 2018-09-03 ENCOUNTER — Ambulatory Visit
Admission: RE | Admit: 2018-09-03 | Discharge: 2018-09-03 | Disposition: A | Discharge status: Home / Self Care | Payer: Medicare Other | Source: Ambulatory Visit | Attending: Internal Medicine | Admitting: Internal Medicine

## 2018-09-03 DIAGNOSIS — R413 Other amnesia: Secondary | ICD-10-CM

## 2018-09-06 ENCOUNTER — Encounter: Payer: Self-pay | Admitting: Neurology

## 2018-09-22 DIAGNOSIS — C61 Malignant neoplasm of prostate: Secondary | ICD-10-CM | POA: Diagnosis not present

## 2018-10-06 DIAGNOSIS — N5201 Erectile dysfunction due to arterial insufficiency: Secondary | ICD-10-CM | POA: Diagnosis not present

## 2018-10-06 DIAGNOSIS — C61 Malignant neoplasm of prostate: Secondary | ICD-10-CM | POA: Diagnosis not present

## 2018-11-01 ENCOUNTER — Telehealth: Payer: Self-pay | Admitting: Internal Medicine

## 2018-11-01 MED ORDER — FLUOXETINE HCL 20 MG PO CAPS: 20.0000 mg | ORAL_CAPSULE | Freq: Every day | ORAL | 1 refills | Status: DC

## 2018-11-01 NOTE — Telephone Encounter (Signed)
Copied from [place] [id number]. Topic: Quick Communication - See Telephone Encounter >> Nov 01, 2018  4:30 PM Vernona Rieger wrote: CRM for notification. See Telephone encounter for: 11/01/18.  Patient's wife would like to know if the nurse could call her in regards to increasing his FLUoxetine (PROZAC) 20 MG capsule to 40 mg to see if they can get better results.

## 2018-11-05 NOTE — Telephone Encounter (Signed)
Patient wife called back in regards to the increase she requested on his FLUoxetine (PROZAC) 20 MG capsule She stated that new Rx was picked up at the pharmacy but instructions were not changed it still states that he should take just 1tablet daily per her message she will be doubling up on the pills so he can have 40mg . Please call Ph# [phone]

## 2018-11-05 NOTE — Telephone Encounter (Signed)
Very sorry, but we do not normally change psychiatric medications by phone request from family  Please consider OV

## 2018-11-05 NOTE — Telephone Encounter (Signed)
Informed pt via VM

## 2018-11-08 ENCOUNTER — Telehealth: Payer: Self-pay

## 2018-11-08 NOTE — Telephone Encounter (Signed)
Spoke with pt's wife advising that we are not currently seeing pt's in the office at this time due to the COVID-19 pandemic.  Advised that our office will be in touch to r/s NP appt.  Wife understandably frustrated but appreciative of my call.

## 2018-11-12 ENCOUNTER — Ambulatory Visit: Payer: Medicare Other | Admitting: Neurology

## 2018-12-09 ENCOUNTER — Encounter: Payer: Self-pay | Admitting: Internal Medicine

## 2018-12-09 ENCOUNTER — Other Ambulatory Visit: Payer: Self-pay

## 2018-12-09 ENCOUNTER — Ambulatory Visit (INDEPENDENT_AMBULATORY_CARE_PROVIDER_SITE_OTHER): Payer: Medicare Other | Admitting: Internal Medicine

## 2018-12-09 DIAGNOSIS — F419 Anxiety disorder, unspecified: Secondary | ICD-10-CM | POA: Diagnosis not present

## 2018-12-09 MED ORDER — FLUOXETINE HCL 40 MG PO CAPS: 40.0000 mg | ORAL_CAPSULE | Freq: Every day | ORAL | 3 refills | Status: DC

## 2018-12-09 NOTE — Assessment & Plan Note (Signed)
Chronic with recent worsening, ok for increased prozac to 40 mg daily,  to f/u any worsening symptoms or concerns, declines counseling referral

## 2018-12-09 NOTE — Progress Notes (Signed)
Patient ID: Russell Sampson, male   DOB: [birth year], 68 y.o.   MRN: [id number]  Virtual Visit via Video Note  I connected with Russell Sampson on 12/09/18 at  4:00 PM EDT by a video enabled telemedicine application and verified that I am speaking with the correct person using two identifiers. I am at office, pt is at home, and wife present sitting next to him   I discussed the limitations of evaluation and management by telemedicine and the availability of in person appointments. The patient expressed understanding and agreed to proceed.  History of Present Illness: Pt here and denies worsening depressive symptoms, suicidal ideation, or panic; has ongoing anxiety, and increased recently during the pandemic such that changes in memory, attention and general nervous behavior has worsened.  Pt and wife denies agitation, aggressive or combative or other behavior change.  Has some forgetfulness at times, and they are concerned as brother and mother both had dementia Past Medical History:  Diagnosis Date  . Anxiety    when he was out of work  . Chicken pox   . Difficult intubation 11/10/2016   Anterior airway, decreased neck mobility  . Personal history of colonic polyps 10/2007   3-4 small adenomas  . Prostate cancer [facility])    Past Surgical History:  Procedure Laterality Date  . CLOSED REDUCTION FOREARM FRACTURE     1962  . COLONOSCOPY W/ POLYPECTOMY  10/22/2007   3-4 adenomas, max 34mm  . LYMPHADENECTOMY Bilateral 11/10/2016   Procedure: PELVIC LYMPHADENECTOMY;  Surgeon: Raynelle Bring, MD;  Location[facility];  Service: Urology;  Laterality: Bilateral;  . PROSTATE BIOPSY     09/19/2016  . ROBOT ASSISTED LAPAROSCOPIC RADICAL PROSTATECTOMY N/A 11/10/2016   Procedure: XI ROBOTIC ASSISTED LAPAROSCOPIC RADICAL PROSTATECTOMY LEVEL 2;  Surgeon: Raynelle Bring, MD;  Location[facility];  Service: Urology;  Laterality: N/A;    reports that he has never smoked. He has never used smokeless tobacco. He reports that  he does not drink alcohol or use drugs. family history includes Alzheimer's disease in his mother; Anxiety disorder in his sister; Prostate cancer in his father; Stroke in his mother. No Known Allergies Current Outpatient Medications on File Prior to Visit  Medication Sig Dispense Refill  . FLUoxetine (PROZAC) 20 MG capsule Take 1 capsule (20 mg total) by mouth daily. 90 capsule 1  . Multiple Vitamin (THERA) TABS Take by mouth.    . vitamin B-12 (CYANOCOBALAMIN) 500 MCG tablet Take by mouth.     No current facility-administered medications on file prior to visit.     Observations/Objective: Alert, NAD, nervous appearing o/w appropriate mood and affect, cn 2-12 intact, moves all 4s,, no rash or swelling Lab Results  Component Value Date   WBC 7.8 08/26/2018   HGB 14.0 08/26/2018   HCT 42.6 08/26/2018   PLT 253.0 08/26/2018   GLUCOSE 88 08/26/2018   CHOL 182 08/26/2018   TRIG 186.0 (H) 08/26/2018   HDL 38.90 (L) 08/26/2018   LDLCALC 106 (H) 08/26/2018   ALT 16 08/26/2018   AST 16 08/26/2018   NA 139 08/26/2018   K 3.8 08/26/2018   CL 104 08/26/2018   CREATININE 0.83 08/26/2018   BUN 16 08/26/2018   CO2 27 08/26/2018   TSH 2.94 08/26/2018   PSA 0.00 Repeated and verified X2. (L) 08/26/2018   Assessment and Plan: See notes  Follow Up Instructions: See notes   I discussed the assessment and treatment plan with the patient. The patient was  provided an opportunity to ask questions and all were answered. The patient agreed with the plan and demonstrated an understanding of the instructions.   The patient was advised to call back or seek an in-person evaluation if the symptoms worsen or if the condition fails to improve as anticipated.  [name], MD

## 2018-12-09 NOTE — Patient Instructions (Signed)
Ok to increase the prozac to 40 mg per day  Please continue all other medications as before, and refills have been done if requested.  Please have the pharmacy call with any other refills you may need.  Please keep your appointments with your specialists as you may have planned  Please call if you change your mind about the referral for counseling

## 2019-01-18 ENCOUNTER — Ambulatory Visit: Payer: Medicare Other | Admitting: Neurology

## 2019-01-19 ENCOUNTER — Encounter: Payer: Self-pay | Admitting: Neurology

## 2019-02-08 ENCOUNTER — Telehealth (INDEPENDENT_AMBULATORY_CARE_PROVIDER_SITE_OTHER): Payer: Medicare Other | Admitting: Neurology

## 2019-02-08 ENCOUNTER — Other Ambulatory Visit: Payer: Self-pay

## 2019-02-08 VITALS — Ht 71.0 in | Wt 190.0 lb

## 2019-02-08 DIAGNOSIS — R413 Other amnesia: Secondary | ICD-10-CM

## 2019-02-08 DIAGNOSIS — F419 Anxiety disorder, unspecified: Secondary | ICD-10-CM

## 2019-02-08 NOTE — Progress Notes (Signed)
Virtual Visit via Video Note The purpose of this virtual visit is to provide medical care while limiting exposure to the novel coronavirus.    Consent was obtained for video visit:  Yes.   Answered questions that patient had about telehealth interaction:  Yes.   I discussed the limitations, risks, security and privacy concerns of performing an evaluation and management service by telemedicine. I also discussed with the patient that there may be a patient responsible charge related to this service. The patient expressed understanding and agreed to proceed.  Pt location: Home Physician Location: office Name of referring provider:  Biagio Borg, MD I connected with Russell Sampson at patients initiation/request on 02/08/2019 at  9:00 AM EDT by video enabled telemedicine application and verified that I am speaking with the correct person using two identifiers. Pt MRN:  [id number] Pt DOB:  Apr 28, 1951 Video Participants:  Russell Sampson;  Elie Confer (wife)   History of Present Illness:  This is a very pleasant 68 year old right-handed man with a history of prostate cancer, anxiety, presenting for evaluation of memory concerns. He started becoming concerned due to his brother's diagnosis of early onset Alzheimer's dementia at age 73. His mother was diagnosed with dementia at age 33 and had it for 7 years until she passed away 13 years ago. He has 2 maternal aunts and a maternal cousin with dementia. This has caused him anxiety that he would develop dementia, he states right now his memory is pretty good and he has not noticed any memory changes himself. His wife has noticed that over the past couple of years, she would tell the same thing repeatedly, he says he is just trying to make a point. He drives without getting lost, his wife has noticed that sometimes he does not pay attention and she would notice he would be drifting or the car behind them honks when the light changes. He manages bills and  medications without difficulties. He misplaces things and would "totally freak out about it" per wife. He has always been this way, but it is more than it used to be, now occurring at least once a week. She states that his personality has always been a very anxious individual, he is a very negative individual, but over the past few months, this has worsened. He watches the 10pm news nightly and repeats twice during the visit that he has cut down on this. His wife states he thinks the world is a terrible place and he is adamant he goes with her when she goes out because someone may do something. He is not necessarily paranoid per wife, no hallucinations. She states he does not think the anxiety is a problem. She does not think the Prozac is helping.   He denies any headaches, dizziness, vision changes, dysarthria/dysphagia, neck/back pain, focal numbness/tingling/weakness, bowel/bladder dysfunction, anosmia. He denies any tremors, his wife reminds him he has occasional right hand shaking that they had checked out several years ago, diagnosed as benign essential tremor. It does not affect writing or using utensils. He denies any falls. Sleep is good. No history of concussions or alcohol use. 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 He feels he was forced into early retirement. He does work around [employer] and with old classic cars, he helps care for his brother with dementia.   I personally reviewed MRI brain without contrast done  08/2018 which did not show any acute changes, there was mild diffuse age-appropriate atrophy, minimal chronic microvascular disease.  Laboratory Data: Lab Results  Component Value Date   TSH 2.94 08/26/2018   Lab Results  Component Value Date   [id number] 08/26/2018     PAST MEDICAL HISTORY: Past Medical History:  Diagnosis Date  . Anxiety    when he was out of work  . Chicken pox   .  Difficult intubation 11/10/2016   Anterior airway, decreased neck mobility  . Personal history of colonic polyps 10/2007   3-4 small adenomas  . Prostate cancer ([place])     PAST SURGICAL HISTORY: Past Surgical History:  Procedure Laterality Date  . CLOSED REDUCTION FOREARM FRACTURE     1962  . COLONOSCOPY W/ POLYPECTOMY  10/22/2007   3-4 adenomas, max 71mm  . LYMPHADENECTOMY Bilateral 11/10/2016   Procedure: PELVIC LYMPHADENECTOMY;  Surgeon: Raynelle Bring, MD;  Location: [facility];  Service: Urology;  Laterality: Bilateral;  . PROSTATE BIOPSY     09/19/2016  . ROBOT ASSISTED LAPAROSCOPIC RADICAL PROSTATECTOMY N/A 11/10/2016   Procedure: XI ROBOTIC ASSISTED LAPAROSCOPIC RADICAL PROSTATECTOMY LEVEL 2;  Surgeon: Raynelle Bring, MD;  Location: [facility];  Service: Urology;  Laterality: N/A;    MEDICATIONS: Current Outpatient Medications on File Prior to Visit  Medication Sig Dispense Refill  . FLUoxetine (PROZAC) 40 MG capsule Take 1 capsule (40 mg total) by mouth daily. 90 capsule 3  . Multiple Vitamin (THERA) TABS Take by mouth.    . Multiple Vitamins-Minerals (AIRBORNE PO) Take 1 tablet by mouth daily.    . vitamin B-12 (CYANOCOBALAMIN) 500 MCG tablet Take by mouth.     No current facility-administered medications on file prior to visit.     ALLERGIES: No Known Allergies  FAMILY HISTORY: Family History  Problem Relation Age of Onset  . Prostate cancer Father   . Alzheimer's disease Mother   . Stroke Mother   . Anxiety disorder Sister   . Colon cancer Neg Hx      Observations/Objective:   Vitals:   02/08/19 0809  Weight: 190 lb (86.2 kg)  Height: 5\' 11"  (1.803 m)   GEN:  The patient appears stated age and is in NAD.  Neurological examination: Patient is awake, alert, oriented x 3. No aphasia or dysarthria. Intact fluency and comprehension. Remote and recent memory intact. Able to name and repeat. Cranial nerves: Extraocular movements intact with no nystagmus. No facial asymmetry.  Motor: moves all extremities symmetrically, at least anti-gravity x 4. No incoordination on finger to nose testing. Gait: narrow-based and steady, able to tandem walk adequately. Negative Romberg test.  Montreal Cognitive Assessment  02/07/2019  Visuospatial/ Executive (0/5) 4  Naming (0/3) 3  Attention: Read list of digits (0/2) 2  Attention: Read list of letters (0/1) 1  Attention: Serial 7 subtraction starting at 100 (0/3) 3  Language: Repeat phrase (0/2) 1  Language : Fluency (0/1) 1  Abstraction (0/2) 0  Delayed Recall (0/5) 5  Orientation (0/6) 6  Total 26  Adjusted Score (based on education) 26    Assessment and Plan:   This is a very pleasant 68 year old right-handed man with a history of prostate cancer, anxiety, presenting for evaluation of memory concerns. He has a strong family history of dementia and family has noticed he repeats himself, no difficulties with complex tasks. His MOCA score today is normal, 26/30. MRI brain overall unremarkable, TSH and B12 normal. We discussed that there is  no evidence of dementia at this time, and discussed overall risks and strategies that can be done to help with memory, including control of vascular risk factors, physical and brain stimulation exercises, the Mediterranean diet, and better control of anxiety. He was encouraged to see a therapist for the anxiety. He will be scheduled for Neurocognitive testing to further evaluate memory concerns and establish a baseline. He will follow-up in 6 months and knows to call for any changes.    Follow Up Instructions:   -I discussed the assessment and treatment plan with the patient. The patient was provided an opportunity to ask questions and all were answered. The patient agreed with the plan and demonstrated an understanding of the instructions.   The patient was advised to call back or seek an in-person evaluation if the symptoms worsen or if the condition fails to improve as anticipated.       Cameron Sprang, MD

## 2019-02-08 NOTE — Addendum Note (Signed)
Addended by: Jake Seats on: 02/08/2019 10:02 AM   Modules accepted: Orders

## 2019-02-17 DIAGNOSIS — H43811 Vitreous degeneration, right eye: Secondary | ICD-10-CM | POA: Diagnosis not present

## 2019-02-17 DIAGNOSIS — Z961 Presence of intraocular lens: Secondary | ICD-10-CM | POA: Diagnosis not present

## 2019-02-17 DIAGNOSIS — H35371 Puckering of macula, right eye: Secondary | ICD-10-CM | POA: Diagnosis not present

## 2019-02-17 DIAGNOSIS — H35033 Hypertensive retinopathy, bilateral: Secondary | ICD-10-CM | POA: Diagnosis not present

## 2019-02-17 LAB — HM DIABETES EYE EXAM

## 2019-02-24 ENCOUNTER — Other Ambulatory Visit: Payer: Self-pay

## 2019-02-24 ENCOUNTER — Encounter: Payer: Self-pay | Admitting: Internal Medicine

## 2019-02-24 ENCOUNTER — Ambulatory Visit (INDEPENDENT_AMBULATORY_CARE_PROVIDER_SITE_OTHER): Payer: Medicare Other | Admitting: Internal Medicine

## 2019-02-24 DIAGNOSIS — F419 Anxiety disorder, unspecified: Secondary | ICD-10-CM

## 2019-02-24 DIAGNOSIS — Z6836 Body mass index (BMI) 36.0-36.9, adult: Secondary | ICD-10-CM

## 2019-02-24 DIAGNOSIS — R413 Other amnesia: Secondary | ICD-10-CM

## 2019-02-24 DIAGNOSIS — E669 Obesity, unspecified: Secondary | ICD-10-CM

## 2019-02-24 DIAGNOSIS — C61 Malignant neoplasm of prostate: Secondary | ICD-10-CM | POA: Diagnosis not present

## 2019-02-24 NOTE — Assessment & Plan Note (Signed)
Overall stable, to cont prozac 40, also refer counseling

## 2019-02-24 NOTE — Progress Notes (Deleted)
   Subjective:    Patient ID: Russell Sampson, male    DOB: [birth year], 68 y.o.   MRN: [id number]  HPI    BP Readings from Last 3 Encounters:  08/26/18 126/74  07/30/18 140/72  08/03/17 126/72     Review of Systems     Objective:   Physical Exam    Lab Results  Component Value Date   WBC 7.8 08/26/2018   HGB 14.0 08/26/2018   HCT 42.6 08/26/2018   PLT 253.0 08/26/2018   GLUCOSE 88 08/26/2018   CHOL 182 08/26/2018   TRIG 186.0 (H) 08/26/2018   HDL 38.90 (L) 08/26/2018   LDLCALC 106 (H) 08/26/2018   ALT 16 08/26/2018   AST 16 08/26/2018   NA 139 08/26/2018   K 3.8 08/26/2018   CL 104 08/26/2018   CREATININE 0.83 08/26/2018   BUN 16 08/26/2018   CO2 27 08/26/2018   TSH 2.94 08/26/2018   PSA 0.00 Repeated and verified X2. (L) 08/26/2018      Assessment & Plan:

## 2019-02-24 NOTE — Assessment & Plan Note (Signed)
?   Pseudodementia - for f/u neurology as above

## 2019-02-24 NOTE — Progress Notes (Signed)
Patient ID: Russell Sampson, male   DOB: 1951/01/26, 68 y.o.   MRN: [id number]  Virtual Visit via Video Note  I connected with Russell Sampson on 02/24/19 at  8:20 AM EDT by a video enabled telemedicine application and verified that I am speaking with the correct person using two identifiers.  Location: Patient: at home Provider: at office   I discussed the limitations of evaluation and management by telemedicine and the availability of in person appointments. The patient expressed understanding and agreed to proceed.  History of Present Illness: Here to f/u; overall doing ok,  Pt denies chest pain, increasing sob or doe, wheezing, orthopnea, PND, increased LE swelling, palpitations, dizziness or syncope.  Pt denies new neurological symptoms such as new headache, or facial or extremity weakness or numbness.  Pt denies polydipsia, polyuria, or low sugar episode.  Pt states overall good compliance with meds, mostly trying to follow appropriate diet, with wt overall stable,  but little exercise however.  Denies worsening depressive symptoms, suicidal ideation, or panic; has ongoing anxiety, significant but still relatively ok on prozac, but has seen neurology who also though counseling would be helpful, so he (and wife) are asking for this today.  Denies urinary symptoms such as dysuria, frequency, urgency, flank pain, hematuria or n/v, fever, chills. Past Medical History:  Diagnosis Date  . Anxiety    when he was out of work  . Chicken pox   . Difficult intubation 11/10/2016   Anterior airway, decreased neck mobility  . Personal history of colonic polyps 10/2007   3-4 small adenomas  . Prostate cancer [facility])    Past Surgical History:  Procedure Laterality Date  . CLOSED REDUCTION FOREARM FRACTURE     1962  . COLONOSCOPY W/ POLYPECTOMY  10/22/2007   3-4 adenomas, max 76mm  . LYMPHADENECTOMY Bilateral 11/10/2016   Procedure: PELVIC LYMPHADENECTOMY;  Surgeon: Raynelle Bring, MD;  Location: [facility];   Service: Urology;  Laterality: Bilateral;  . PROSTATE BIOPSY     09/19/2016  . ROBOT ASSISTED LAPAROSCOPIC RADICAL PROSTATECTOMY N/A 11/10/2016   Procedure: XI ROBOTIC ASSISTED LAPAROSCOPIC RADICAL PROSTATECTOMY LEVEL 2;  Surgeon: Raynelle Bring, MD;  Location[facility];  Service: Urology;  Laterality: N/A;    reports that he has never smoked. He has never used smokeless tobacco. He reports that he does not drink alcohol or use drugs. family history includes Alzheimer's disease in his mother; Anxiety disorder in his sister; Prostate cancer in his father; Stroke in his mother. No Known Allergies Current Outpatient Medications on File Prior to Visit  Medication Sig Dispense Refill  . FLUoxetine (PROZAC) 40 MG capsule Take 1 capsule (40 mg total) by mouth daily. 90 capsule 3  . Multiple Vitamin (THERA) TABS Take by mouth.    . Multiple Vitamins-Minerals (AIRBORNE PO) Take 1 tablet by mouth daily.    . vitamin B-12 (CYANOCOBALAMIN) 500 MCG tablet Take by mouth.     No current facility-administered medications on file prior to visit.     Observations/Objective: Alert, NAD, nervous mood and affect, resps normal, cn 2-12 intact, moves all 4s, no visible rash or swelling Past Medical History:  Diagnosis Date  . Anxiety    when he was out of work  . Chicken pox   . Difficult intubation 11/10/2016   Anterior airway, decreased neck mobility  . Personal history of colonic polyps 10/2007   3-4 small adenomas  . Prostate cancer [facility])    Past Surgical History:  Procedure Laterality Date  .  CLOSED REDUCTION FOREARM FRACTURE     1962  . COLONOSCOPY W/ POLYPECTOMY  10/22/2007   3-4 adenomas, max 59mm  . LYMPHADENECTOMY Bilateral 11/10/2016   Procedure: PELVIC LYMPHADENECTOMY;  Surgeon: Raynelle Bring, MD;  Location: [facility];  Service: Urology;  Laterality: Bilateral;  . PROSTATE BIOPSY     09/19/2016  . ROBOT ASSISTED LAPAROSCOPIC RADICAL PROSTATECTOMY N/A 11/10/2016   Procedure: XI ROBOTIC ASSISTED  LAPAROSCOPIC RADICAL PROSTATECTOMY LEVEL 2;  Surgeon: Raynelle Bring, MD;  Location[facility];  Service: Urology;  Laterality: N/A;    reports that he has never smoked. He has never used smokeless tobacco. He reports that he does not drink alcohol or use drugs. family history includes Alzheimer's disease in his mother; Anxiety disorder in his sister; Prostate cancer in his father; Stroke in his mother. No Known Allergies Current Outpatient Medications on File Prior to Visit  Medication Sig Dispense Refill  . FLUoxetine (PROZAC) 40 MG capsule Take 1 capsule (40 mg total) by mouth daily. 90 capsule 3  . Multiple Vitamin (THERA) TABS Take by mouth.    . Multiple Vitamins-Minerals (AIRBORNE PO) Take 1 tablet by mouth daily.    . vitamin B-12 (CYANOCOBALAMIN) 500 MCG tablet Take by mouth.     No current facility-administered medications on file prior to visit.    Assessment and Plan: See notes  Follow Up Instructions: See notes   I discussed the assessment and treatment plan with the patient. The patient was provided an opportunity to ask questions and all were answered. The patient agreed with the plan and demonstrated an understanding of the instructions.   The patient was advised to call back or seek an in-person evaluation if the symptoms worsen or if the condition fails to improve as anticipated.   Russell Cower, MD

## 2019-02-24 NOTE — Assessment & Plan Note (Signed)
Encouraged for increased activity as can help with anxiety as well

## 2019-02-24 NOTE — Assessment & Plan Note (Signed)
stable overall by history and exam, recent data reviewed with pt, and pt to continue medical treatment as before,  to f/u any worsening symptoms or concerns  

## 2019-02-24 NOTE — Patient Instructions (Signed)
Please continue all other medications as before, and refills have been done if requested.  Please have the pharmacy call with any other refills you may need.  Please continue your efforts at being more active, low cholesterol diet, and weight control  Please keep your appointments with your specialists as you may have planned  You will be contacted regarding the referral for: counseling  Please check your BP monthly at home  Please return in 6 months, or sooner if needed

## 2019-03-10 ENCOUNTER — Encounter: Payer: Self-pay | Admitting: Internal Medicine

## 2019-03-10 DIAGNOSIS — T884XXA Failed or difficult intubation, initial encounter: Secondary | ICD-10-CM

## 2019-03-10 HISTORY — DX: Failed or difficult intubation, initial encounter: T88.4XXA

## 2019-04-04 ENCOUNTER — Ambulatory Visit (INDEPENDENT_AMBULATORY_CARE_PROVIDER_SITE_OTHER): Payer: Medicare Other | Admitting: Psychology

## 2019-04-04 DIAGNOSIS — F419 Anxiety disorder, unspecified: Secondary | ICD-10-CM

## 2019-04-05 DIAGNOSIS — Z23 Encounter for immunization: Secondary | ICD-10-CM | POA: Diagnosis not present

## 2019-04-08 ENCOUNTER — Encounter: Payer: Self-pay | Admitting: Internal Medicine

## 2019-04-15 ENCOUNTER — Telehealth: Payer: Self-pay | Admitting: Internal Medicine

## 2019-04-15 DIAGNOSIS — Z86010 Personal history of colonic polyps: Secondary | ICD-10-CM

## 2019-04-18 ENCOUNTER — Other Ambulatory Visit: Payer: Self-pay

## 2019-04-18 DIAGNOSIS — Z86010 Personal history of colonic polyps: Secondary | ICD-10-CM

## 2019-04-18 NOTE — Telephone Encounter (Signed)
Patient has been scheduled at [facility] for 05/30/19 8:00. He understands to come for pre-visit on 05/18/19 11:00 and COVID screen on 05/26/19 between 9-2:30 pm

## 2019-04-29 ENCOUNTER — Ambulatory Visit: Payer: Medicare Other | Admitting: Psychology

## 2019-05-09 ENCOUNTER — Ambulatory Visit (INDEPENDENT_AMBULATORY_CARE_PROVIDER_SITE_OTHER): Payer: Medicare Other | Admitting: Psychology

## 2019-05-09 DIAGNOSIS — F419 Anxiety disorder, unspecified: Secondary | ICD-10-CM | POA: Diagnosis not present

## 2019-05-13 DIAGNOSIS — N5201 Erectile dysfunction due to arterial insufficiency: Secondary | ICD-10-CM | POA: Diagnosis not present

## 2019-05-13 DIAGNOSIS — C61 Malignant neoplasm of prostate: Secondary | ICD-10-CM | POA: Diagnosis not present

## 2019-05-18 ENCOUNTER — Other Ambulatory Visit: Payer: Self-pay

## 2019-05-18 ENCOUNTER — Ambulatory Visit (AMBULATORY_SURGERY_CENTER): Payer: Self-pay | Admitting: *Deleted

## 2019-05-18 VITALS — Ht 71.0 in | Wt 194.4 lb

## 2019-05-18 DIAGNOSIS — Z86010 Personal history of colonic polyps: Secondary | ICD-10-CM

## 2019-05-18 NOTE — Progress Notes (Signed)
No egg or soy allergy known to patient  No issues with past sedation with any surgeries  or procedures, no intubation problems  No diet pills per patient No home 02 use per patient  No blood thinners per patient  Pt denies issues with constipation  No A fib or A flutter  EMMI video sent to pt's e mail   Due to the COVID-19 pandemic we are asking patients to follow these guidelines. Please only bring one care partner. Please be aware that your care partner may wait in the car in the parking lot or if they feel like they will be too hot to wait in the car, they may wait in the lobby on the 4th floor. All care partners are required to wear a mask the entire time (we do not have any that we can provide them), they need to practice social distancing, and we will do a Covid check for all patient's and care partners when you arrive. Also we will check their temperature and your temperature. If the care partner waits in their car they need to stay in the parking lot the entire time and we will call them on their cell phone when the patient is ready for discharge so they can bring the car to the front of the building. Also all patient's will need to wear a mask into building.  TEMPERATURE 95.9 TEMPORAL

## 2019-05-26 ENCOUNTER — Other Ambulatory Visit: Payer: Self-pay

## 2019-05-26 ENCOUNTER — Other Ambulatory Visit (HOSPITAL_COMMUNITY)
Admission: RE | Admit: 2019-05-26 | Discharge: 2019-05-26 | Disposition: A | Discharge status: Home / Self Care | Payer: Medicare Other | Source: Ambulatory Visit | Attending: Internal Medicine | Admitting: Internal Medicine

## 2019-05-26 ENCOUNTER — Encounter (HOSPITAL_COMMUNITY): Payer: Self-pay | Admitting: *Deleted

## 2019-05-26 DIAGNOSIS — Z01812 Encounter for preprocedural laboratory examination: Secondary | ICD-10-CM | POA: Insufficient documentation

## 2019-05-26 DIAGNOSIS — Z20828 Contact with and (suspected) exposure to other viral communicable diseases: Secondary | ICD-10-CM | POA: Insufficient documentation

## 2019-05-27 LAB — NOVEL CORONAVIRUS, NAA (HOSP ORDER, SEND-OUT TO REF LAB; TAT 18-24 HRS): SARS-CoV-2, NAA: NOT DETECTED

## 2019-05-30 ENCOUNTER — Ambulatory Visit (HOSPITAL_COMMUNITY): Payer: Medicare Other | Admitting: Certified Registered"

## 2019-05-30 ENCOUNTER — Ambulatory Visit (HOSPITAL_COMMUNITY)
Admission: RE | Admit: 2019-05-30 | Discharge: 2019-05-30 | Disposition: A | Discharge status: Home / Self Care | Payer: Medicare Other | Attending: Internal Medicine | Admitting: Internal Medicine

## 2019-05-30 ENCOUNTER — Encounter (HOSPITAL_COMMUNITY): Payer: Self-pay

## 2019-05-30 ENCOUNTER — Encounter (HOSPITAL_COMMUNITY)
Admission: RE | Disposition: A | Discharge status: Home / Self Care | Payer: Self-pay | Source: Home / Self Care | Attending: Internal Medicine

## 2019-05-30 ENCOUNTER — Other Ambulatory Visit: Payer: Self-pay

## 2019-05-30 DIAGNOSIS — Z79899 Other long term (current) drug therapy: Secondary | ICD-10-CM | POA: Insufficient documentation

## 2019-05-30 DIAGNOSIS — Z860100 Personal history of colon polyps, unspecified: Secondary | ICD-10-CM

## 2019-05-30 DIAGNOSIS — Z09 Encounter for follow-up examination after completed treatment for conditions other than malignant neoplasm: Secondary | ICD-10-CM | POA: Diagnosis present

## 2019-05-30 DIAGNOSIS — Z8546 Personal history of malignant neoplasm of prostate: Secondary | ICD-10-CM | POA: Diagnosis not present

## 2019-05-30 DIAGNOSIS — D120 Benign neoplasm of cecum: Secondary | ICD-10-CM

## 2019-05-30 DIAGNOSIS — Z9079 Acquired absence of other genital organ(s): Secondary | ICD-10-CM | POA: Insufficient documentation

## 2019-05-30 DIAGNOSIS — F419 Anxiety disorder, unspecified: Secondary | ICD-10-CM | POA: Insufficient documentation

## 2019-05-30 DIAGNOSIS — D125 Benign neoplasm of sigmoid colon: Secondary | ICD-10-CM

## 2019-05-30 DIAGNOSIS — D126 Benign neoplasm of colon, unspecified: Secondary | ICD-10-CM | POA: Diagnosis not present

## 2019-05-30 DIAGNOSIS — D123 Benign neoplasm of transverse colon: Secondary | ICD-10-CM | POA: Diagnosis not present

## 2019-05-30 DIAGNOSIS — D122 Benign neoplasm of ascending colon: Secondary | ICD-10-CM

## 2019-05-30 HISTORY — PX: POLYPECTOMY: SHX5525

## 2019-05-30 HISTORY — PX: COLONOSCOPY WITH PROPOFOL: SHX5780

## 2019-05-30 SURGERY — COLONOSCOPY WITH PROPOFOL
Anesthesia: Monitor Anesthesia Care

## 2019-05-30 MED ORDER — PROPOFOL 10 MG/ML IV BOLUS
INTRAVENOUS | Status: DC | PRN
  Administered 2019-05-30 (×2): 20 mg via INTRAVENOUS
  Administered 2019-05-30: 10 mg via INTRAVENOUS

## 2019-05-30 MED ORDER — PROPOFOL 500 MG/50ML IV EMUL
INTRAVENOUS | Status: DC | PRN
  Administered 2019-05-30: 120 ug/kg/min via INTRAVENOUS

## 2019-05-30 MED ORDER — SODIUM CHLORIDE 0.9 % IV SOLN: INTRAVENOUS | Status: DC

## 2019-05-30 MED ORDER — LIDOCAINE HCL (CARDIAC) PF 100 MG/5ML IV SOSY
PREFILLED_SYRINGE | INTRAVENOUS | Status: DC | PRN
  Administered 2019-05-30: 50 mg via INTRATRACHEAL

## 2019-05-30 MED ORDER — LACTATED RINGERS IV SOLN
INTRAVENOUS | Status: DC
  Administered 2019-05-30: 1000 mL via INTRAVENOUS

## 2019-05-30 SURGICAL SUPPLY — 21 items

## 2019-05-30 NOTE — Discharge Instructions (Signed)
° °  I found and removed 4 tiny polyps - no signs of cancer.  I will let you know pathology results and when to have another routine colonoscopy by mail and/or My Chart.  I appreciate the opportunity to care for you.  Gatha Mayer, MD, FACG  YOU HAD AN ENDOSCOPIC PROCEDURE TODAY: Refer to the procedure report and other information in the discharge instructions given to you for any specific questions about what was found during the examination. If this information does not answer your questions, please call Dr. Celesta Aver office at [phone] to clarify.   YOU SHOULD EXPECT: Some feelings of bloating in the abdomen. Passage of more gas than usual. Walking can help get rid of the air that was put into your GI tract during the procedure and reduce the bloating. If you had a lower endoscopy (such as a colonoscopy or flexible sigmoidoscopy) you may notice spotting of blood in your stool or on the toilet paper. Some abdominal soreness may be present for a day or two, also.  DIET: Your first meal following the procedure should be a light meal and then it is ok to progress to your normal diet. A half-sandwich or bowl of soup is an example of a good first meal. Heavy or fried foods are harder to digest and may make you feel nauseous or bloated. Drink plenty of fluids but you should avoid alcoholic beverages for 24 hours.   ACTIVITY: Your care partner should take you home directly after the procedure. You should plan to take it easy, moving slowly for the rest of the day. You can resume normal activity the day after the procedure however YOU SHOULD NOT DRIVE, use power tools, machinery or perform tasks that involve climbing or major physical exertion for 24 hours (because of the sedation medicines used during the test).   SYMPTOMS TO REPORT IMMEDIATELY: A gastroenterologist can be reached at any hour. Please call [phone]  for any of the following symptoms:  Following lower endoscopy  (colonoscopy, flexible sigmoidoscopy) Excessive amounts of blood in the stool  Significant tenderness, worsening of abdominal pains  Swelling of the abdomen that is new, acute  Fever of 100 or higher

## 2019-05-30 NOTE — Transfer of Care (Signed)
Immediate Anesthesia Transfer of Care Note  Patient: Russell Sampson  Procedure(s) Performed: COLONOSCOPY WITH PROPOFOL (N/A ) POLYPECTOMY  Patient Location: Endoscopy Unit  Anesthesia Type:MAC  Level of Consciousness: awake, oriented and patient cooperative  Airway & Oxygen Therapy: Patient Spontanous Breathing and Patient connected to face mask oxygen  Post-op Assessment: Report given to RN and Post -op Vital signs reviewed and stable  Post vital signs: Reviewed and stable  Last Vitals:  Vitals Value Taken Time  BP 146/83 05/30/19 0830  Temp 36.5 C 05/30/19 0829  Pulse 67 05/30/19 0832  Resp 16 05/30/19 0832  SpO2 99 % 05/30/19 0832  Vitals shown include unvalidated device data.  Last Pain:  Vitals:   05/30/19 0829  TempSrc: Oral  PainSc: 0-No pain         Complications: No apparent anesthesia complications

## 2019-05-30 NOTE — Anesthesia Preprocedure Evaluation (Signed)
Anesthesia Evaluation  Patient identified by MRN, date of birth, ID band Patient awake    Reviewed: Allergy & Precautions, NPO status , Patient's Chart, lab work & pertinent test results  History of Anesthesia Complications (+) DIFFICULT AIRWAY and history of anesthetic complications  Airway Mallampati: IV  TM Distance: <3 FB Neck ROM: Limited    Dental  (+) Dental Advisory Given   Pulmonary neg pulmonary ROS, neg recent URI,    breath sounds clear to auscultation       Cardiovascular negative cardio ROS   Rhythm:Regular     Neuro/Psych negative neurological ROS  negative psych ROS   GI/Hepatic Neg liver ROS, Colon polyps   Endo/Other  negative endocrine ROS  Renal/GU negative Renal ROS     Musculoskeletal negative musculoskeletal ROS (+)   Abdominal   Peds  Hematology negative hematology ROS (+)   Anesthesia Other Findings   Reproductive/Obstetrics                             Anesthesia Physical Anesthesia Plan  ASA: II  Anesthesia Plan: MAC   Post-op Pain Management:    Induction: Intravenous  PONV Risk Score and Plan: 1 and Treatment may vary due to age or medical condition and Propofol infusion  Airway Management Planned: Nasal Cannula  Additional Equipment: None  Intra-op Plan:   Post-operative Plan:   Informed Consent: I have reviewed the patients History and Physical, chart, labs and discussed the procedure including the risks, benefits and alternatives for the proposed anesthesia with the patient or authorized representative who has indicated his/her understanding and acceptance.     Dental advisory given  Plan Discussed with: CRNA and Surgeon  Anesthesia Plan Comments:         Anesthesia Quick Evaluation

## 2019-05-30 NOTE — Op Note (Signed)
[facility] Patient Name: [place] Procedure Date: 05/30/2019 MRN: [id number] Attending MD: Gatha Mayer , MD Date of Birth: 24-Nov-1950 CSN: [id number] Age: 68 Admit Type: Outpatient Procedure:                Colonoscopy Indications:              Surveillance: Personal history of adenomatous                            polyps on last colonoscopy 3 years ago Providers:                Gatha Mayer, MD, Cleda Daub, RN, Cherylynn Ridges, Technician, Lazaro Arms, Technician Referring MD:              Medicines:                Propofol per Anesthesia, Monitored Anesthesia Care Complications:            No immediate complications. Estimated Blood Loss:     Estimated blood loss was minimal. Procedure:                Pre-Anesthesia Assessment:                           - Prior to the procedure, a History and Physical                            was performed, and patient medications and                            allergies were reviewed. The patient's tolerance of                            previous anesthesia was also reviewed. The risks                            and benefits of the procedure and the sedation                            options and risks were discussed with the patient.                            All questions were answered, and informed consent                            was obtained. Prior Anticoagulants: The patient has                            taken no previous anticoagulant or antiplatelet                            agents. ASA Grade Assessment: II - A patient with  mild systemic disease. After reviewing the risks                            and benefits, the patient was deemed in                            satisfactory condition to undergo the procedure.                           After obtaining informed consent, the colonoscope                            was passed under direct vision.  Throughout the                            procedure, the patient's blood pressure, pulse, and                            oxygen saturations were monitored continuously. The                            [id number] Olympus colonoscope was                            introduced through the anus and advanced to the the                            cecum, identified by appendiceal orifice and                            ileocecal valve. The colonoscopy was performed                            without difficulty. The patient tolerated the                            procedure well. The quality of the bowel                            preparation was excellent. The ileocecal valve,                            appendiceal orifice, and rectum were photographed.                            The bowel preparation used was Miralax via split                            dose instruction. Scope In: 8:06:22 AM Scope Out: 8:21:52 AM Scope Withdrawal Time: 0 hours 13 minutes 15 seconds  Total Procedure Duration: 0 hours 15 minutes 30 seconds  Findings:      The perianal examination was normal.      The digital rectal exam findings include surgically absent prostate.      Four sessile polyps  were found in the sigmoid colon, transverse colon,       ascending colon and cecum. The polyps were diminutive in size. These       polyps were removed with a cold snare. Resection and retrieval were       complete. Verification of patient identification for the specimen was       done. Estimated blood loss was minimal.      The exam was otherwise without abnormality on direct and retroflexion       views. Impression:               - A surgically absent prostate found on digital                            rectal exam.                           - Four diminutive polyps in the sigmoid colon, in                            the transverse colon, in the ascending colon and in                            the cecum, removed with  a cold snare. Resected and                            retrieved.                           - The examination was otherwise normal on direct                            and retroflexion views.                           - Personal history of colonic polyps.                           - 3-4 small adenomas 2009 and 2012 overall                           - 03/2016 3 adenomas (small) recall Moderate Sedation:      Not Applicable - Patient had care per Anesthesia. Recommendation:           - Patient has a contact number available for                            emergencies. The signs and symptoms of potential                            delayed complications were discussed with the                            patient. Return to normal activities tomorrow.  Written discharge instructions were provided to the                            patient.                           - Resume previous diet.                           - Continue present medications.                           - Repeat colonoscopy is recommended for                            surveillance. The colonoscopy date will be                            determined after pathology results from today's                            exam become available for review. AT HOSPITAL DUE                            TO DIFFICULT AIRWAY Procedure Code(s):        --- Professional ---                           [phone], Colonoscopy, flexible; with removal of                            tumor(s), polyp(s), or other lesion(s) by snare                            technique Diagnosis Code(s):        --- Professional ---                           Z86.010, Personal history of colonic polyps                           Z90.79, Acquired absence of other genital organ(s)                           K63.5, Polyp of colon CPT copyright 2019 American Medical Association. All rights reserved. The codes documented in this report are preliminary and upon  coder review may  be revised to meet current compliance requirements. Gatha Mayer, MD 05/30/2019 8:42:35 AM This report has been signed electronically. Number of Addenda: 0

## 2019-05-30 NOTE — H&P (Signed)
[facility] History and Physical   Primary Care Physician:  Biagio Borg, MD   Reason for Procedure:   Hx adenomatous polyps colon  Plan:    Colonoscopy The risks and benefits as well as alternatives of endoscopic procedure(s) have been discussed and reviewed. All questions answered. The patient agrees to proceed.  HPI: [place] Sam[place] is a 68 y.o. male w/ hx adenomatous polyps of colon due for surveillance. Procedure at hospital due to difficult airway.    Past Medical History:  Diagnosis Date  . Anxiety    when he was out of work  . Cataract    BILATERAL-REMOVED  . Chicken pox   . Difficult intubation 11/10/2016   Anterior airway, decreased neck mobility  . Personal history of colonic polyps 10/2007   3-4 small adenomas  . Prostate cancer [facility])     Past Surgical History:  Procedure Laterality Date  . CLOSED REDUCTION FOREARM FRACTURE     1962  . COLONOSCOPY    . COLONOSCOPY W/ POLYPECTOMY  10/22/2007   3-4 adenomas, max 53mm  . LYMPHADENECTOMY Bilateral 11/10/2016   Procedure: PELVIC LYMPHADENECTOMY;  Surgeon: Raynelle Bring, MD;  Location: [facility];  Service: Urology;  Laterality: Bilateral;  . POLYPECTOMY    . PROSTATE BIOPSY     09/19/2016  . ROBOT ASSISTED LAPAROSCOPIC RADICAL PROSTATECTOMY N/A 11/10/2016   Procedure: XI ROBOTIC ASSISTED LAPAROSCOPIC RADICAL PROSTATECTOMY LEVEL 2;  Surgeon: Raynelle Bring, MD;  Location: [facility];  Service: Urology;  Laterality: N/A;    Prior to Admission medications   Medication Sig Start Date End Date Taking? Authorizing Provider  b complex vitamins tablet Take 1 tablet by mouth daily.   Yes [provider]  FLUoxetine (PROZAC) 40 MG capsule Take 1 capsule (40 mg total) by mouth daily. 12/09/18  Yes Biagio Borg, MD  Menthol, Topical Analgesic, (BENGAY EX) Apply 1 application topically daily as needed (muscle pain).   Yes [provider]  Multiple Vitamin (THERA) TABS Take 1 tablet by mouth daily.    Yes  [provider]  Multiple Vitamins-Minerals (AIRBORNE PO) Take 2 tablets by mouth daily.    Yes [provider]    Current Facility-Administered Medications  Medication Dose Route Frequency Provider Last Rate Last Dose  . 0.9 %  sodium chloride infusion   Intravenous Continuous Gatha Mayer, MD      . lactated ringers infusion   Intravenous Continuous Gatha Mayer, MD 10 mL/hr at 05/30/19 0735 1,000 mL at 05/30/19 0735    Allergies as of 04/18/2019  . (No Known Allergies)    Family History  Problem Relation Age of Onset  . Prostate cancer Father   . Alzheimer's disease Mother   . Stroke Mother   . Anxiety disorder Sister   . Colon cancer Neg Hx   . Colon polyps Neg Hx   . Esophageal cancer Neg Hx   . Rectal cancer Neg Hx   . Stomach cancer Neg Hx    Social History   Social History Narrative   Fun:    Right handed   One level home      Review of Systems:  All other review of systems negative except as mentioned in the HPI.  Physical Exam: Vital signs in last 24 hours: Temp:  [97.7 F (36.5 C)] 97.7 F (36.5 C) (10/12 0732) Pulse Rate:  [80] 80 (10/12 0732) Resp:  [16] 16 (10/12 0732) BP: (168)/(97) 168/97 (10/12 0732) SpO2:  [98 %]  98 % (10/12 0732)   General:   Alert,  Well-developed, well-nourished, pleasant and cooperative in NAD Lungs:  Clear throughout to auscultation.   Heart:  Regular rate and rhythm; no murmurs, clicks, rubs,  or gallops. Abdomen:  Soft, nontender and nondistended. Normal bowel sounds.   Neuro/Psych:  Alert and cooperative. Normal mood and affect. A and O x 3   @Carl  Simonne Maffucci, MD, [facility] [phone] (pager) 05/30/2019 7:48 AM@

## 2019-05-31 ENCOUNTER — Encounter (HOSPITAL_COMMUNITY): Payer: Self-pay | Admitting: Internal Medicine

## 2019-05-31 LAB — SURGICAL PATHOLOGY

## 2019-05-31 NOTE — Anesthesia Postprocedure Evaluation (Signed)
Anesthesia Post Note  Patient: [place] Sampson  Procedure(s) Performed: COLONOSCOPY WITH PROPOFOL (N/A ) POLYPECTOMY     Patient location during evaluation: Endoscopy Anesthesia Type: MAC Level of consciousness: awake and alert Pain management: pain level controlled Vital Signs Assessment: post-procedure vital signs reviewed and stable Respiratory status: spontaneous breathing, nonlabored ventilation, respiratory function stable and patient connected to nasal cannula oxygen Cardiovascular status: stable and blood pressure returned to baseline Postop Assessment: no apparent nausea or vomiting Anesthetic complications: no    Last Vitals:  Vitals:   05/30/19 0850 05/30/19 0855  BP: (!) 149/90   Pulse: 66 64  Resp: (!) 22 20  Temp:    SpO2: 99% 100%    Last Pain:  Vitals:   05/30/19 0855  TempSrc:   PainSc: 0-No pain                 Vernessa Likes

## 2019-06-01 ENCOUNTER — Encounter: Payer: Self-pay | Admitting: Internal Medicine

## 2019-06-01 NOTE — Progress Notes (Signed)
4 diminutive adenomas Recall 2024 colon  My Chart letter

## 2019-06-07 ENCOUNTER — Ambulatory Visit (INDEPENDENT_AMBULATORY_CARE_PROVIDER_SITE_OTHER): Payer: Medicare Other | Admitting: Psychology

## 2019-06-07 DIAGNOSIS — F419 Anxiety disorder, unspecified: Secondary | ICD-10-CM

## 2019-06-16 ENCOUNTER — Ambulatory Visit: Payer: Medicare Other | Admitting: Psychology

## 2019-06-22 ENCOUNTER — Ambulatory Visit: Payer: Medicare Other | Admitting: Psychology

## 2019-10-12 ENCOUNTER — Other Ambulatory Visit: Payer: Self-pay | Admitting: Internal Medicine

## 2019-10-13 DIAGNOSIS — Z23 Encounter for immunization: Secondary | ICD-10-CM | POA: Diagnosis not present

## 2019-11-04 DIAGNOSIS — C61 Malignant neoplasm of prostate: Secondary | ICD-10-CM | POA: Diagnosis not present

## 2019-11-10 DIAGNOSIS — Z23 Encounter for immunization: Secondary | ICD-10-CM | POA: Diagnosis not present

## 2019-11-11 DIAGNOSIS — C61 Malignant neoplasm of prostate: Secondary | ICD-10-CM | POA: Diagnosis not present

## 2019-11-11 DIAGNOSIS — N3941 Urge incontinence: Secondary | ICD-10-CM | POA: Diagnosis not present

## 2019-12-13 ENCOUNTER — Ambulatory Visit: Payer: Medicare Other | Admitting: Internal Medicine

## 2019-12-14 ENCOUNTER — Other Ambulatory Visit: Payer: Self-pay

## 2019-12-14 ENCOUNTER — Encounter: Payer: Self-pay | Admitting: Internal Medicine

## 2019-12-14 ENCOUNTER — Ambulatory Visit (INDEPENDENT_AMBULATORY_CARE_PROVIDER_SITE_OTHER): Payer: Medicare Other | Admitting: Internal Medicine

## 2019-12-14 VITALS — BP 130/62 | HR 77 | Temp 98.2°F | Ht 71.0 in | Wt 189.0 lb

## 2019-12-14 DIAGNOSIS — R011 Cardiac murmur, unspecified: Secondary | ICD-10-CM | POA: Diagnosis not present

## 2019-12-14 DIAGNOSIS — F419 Anxiety disorder, unspecified: Secondary | ICD-10-CM

## 2019-12-14 DIAGNOSIS — E785 Hyperlipidemia, unspecified: Secondary | ICD-10-CM | POA: Diagnosis not present

## 2019-12-14 DIAGNOSIS — C61 Malignant neoplasm of prostate: Secondary | ICD-10-CM | POA: Diagnosis not present

## 2019-12-14 HISTORY — DX: Cardiac murmur, unspecified: R01.1

## 2019-12-14 HISTORY — DX: Hyperlipidemia, unspecified: E78.5

## 2019-12-14 LAB — HEPATIC FUNCTION PANEL
ALT: 16 U/L (ref 0–53)
AST: 17 U/L (ref 0–37)
Albumin: 4 g/dL (ref 3.5–5.2)
Alkaline Phosphatase: 92 U/L (ref 39–117)
Bilirubin, Direct: 0.1 mg/dL (ref 0.0–0.3)
Total Bilirubin: 0.4 mg/dL (ref 0.2–1.2)
Total Protein: 6.2 g/dL (ref 6.0–8.3)

## 2019-12-14 LAB — LIPID PANEL
Cholesterol: 164 mg/dL (ref 0–200)
HDL: 40.1 mg/dL (ref >39.00)
LDL Cholesterol: 95 mg/dL (ref 0–99)
NonHDL: 123.53
Total CHOL/HDL Ratio: 4
Triglycerides: 143 mg/dL (ref 0.0–149.0)
VLDL: 28.6 mg/dL (ref 0.0–40.0)

## 2019-12-14 LAB — BASIC METABOLIC PANEL
BUN: 22 mg/dL (ref 6–23)
CO2: 28 mEq/L (ref 19–32)
Calcium: 8.6 mg/dL (ref 8.4–10.5)
Chloride: 105 mEq/L (ref 96–112)
Creatinine, Ser: 0.89 mg/dL (ref 0.40–1.50)
GFR: 85.31 mL/min (ref >60.00)
Glucose, Bld: 109 mg/dL — ABNORMAL HIGH (ref 70–99)
Potassium: 3.7 mEq/L (ref 3.5–5.1)
Sodium: 140 mEq/L (ref 135–145)

## 2019-12-14 LAB — PSA: PSA: 0 ng/mL — ABNORMAL LOW (ref 0.10–4.00)

## 2019-12-14 LAB — TSH: TSH: 2.03 u[IU]/mL (ref 0.35–4.50)

## 2019-12-14 NOTE — Progress Notes (Signed)
Subjective:    Patient ID: [place] Sampson, male    DOB: 1950/09/18, 69 y.o.   MRN: [id number]  HPI  Here for yearly f/u;  Overall doing ok;  Pt denies Chest pain, worsening SOB, DOE, wheezing, orthopnea, PND, worsening LE edema, palpitations, dizziness or syncope.  Pt denies neurological change such as new headache, facial or extremity weakness.  Pt denies polydipsia, polyuria, or low sugar symptoms. Pt states overall good compliance with treatment and medications, good tolerability, and has been trying to follow appropriate diet.  Pt denies worsening depressive symptoms, suicidal ideation or panic and prozac seems to be working ok. . No fever, night sweats, wt loss, loss of appetite, or other constitutional symptoms.  Pt states good ability with ADL's, has low fall risk, home safety reviewed and adequate, no other significant changes in hearing or vision, and only occasionally active with exercise.  No new complaints Past Medical History:  Diagnosis Date  . Anxiety    when he was out of work  . Cataract    BILATERAL-REMOVED  . Chicken pox   . Difficult intubation 11/10/2016   Anterior airway, decreased neck mobility  . Heart murmur 12/14/2019  . HLD (hyperlipidemia) 12/14/2019  . Personal history of colonic polyps 10/2007   3-4 small adenomas  . Prostate cancer [facility])    Past Surgical History:  Procedure Laterality Date  . CLOSED REDUCTION FOREARM FRACTURE     1962  . COLONOSCOPY    . COLONOSCOPY W/ POLYPECTOMY  10/22/2007   3-4 adenomas, max 34m  . COLONOSCOPY WITH PROPOFOL N/A 05/30/2019   Procedure: COLONOSCOPY WITH PROPOFOL;  Surgeon: GGatha Mayer MD;  Location: [facility];  Service: Endoscopy;  Laterality: N/A;  . LYMPHADENECTOMY Bilateral 11/10/2016   Procedure: PELVIC LYMPHADENECTOMY;  Surgeon: LRaynelle Bring MD;  Location: [facility];  Service: Urology;  Laterality: Bilateral;  . POLYPECTOMY    . POLYPECTOMY  05/30/2019   Procedure: POLYPECTOMY;  Surgeon: GGatha Mayer MD;   Location: [facility];  Service: Endoscopy;;  . PROSTATE BIOPSY     09/19/2016  . ROBOT ASSISTED LAPAROSCOPIC RADICAL PROSTATECTOMY N/A 11/10/2016   Procedure: XI ROBOTIC ASSISTED LAPAROSCOPIC RADICAL PROSTATECTOMY LEVEL 2;  Surgeon: LRaynelle Bring MD;  Location: [facility];  Service: Urology;  Laterality: N/A;    reports that he has never smoked. He has never used smokeless tobacco. He reports that he does not drink alcohol or use drugs. family history includes Alzheimer's disease in his mother; Anxiety disorder in his sister; Prostate cancer in his father; Stroke in his mother. No Known Allergies Current Outpatient Medications on File Prior to Visit  Medication Sig Dispense Refill  . b complex vitamins tablet Take 1 tablet by mouth daily.    .Marland KitchenFLUoxetine (PROZAC) 40 MG capsule Take 1 capsule (40 mg total) by mouth daily. 90 capsule 3  . ID NOW COVID-19 KIT See admin instructions.    . Menthol, Topical Analgesic, (BENGAY EX) Apply 1 application topically daily as needed (muscle pain).    . Multiple Vitamin (THERA) TABS Take 1 tablet by mouth daily.     . Multiple Vitamins-Minerals (AIRBORNE PO) Take 2 tablets by mouth daily.      No current facility-administered medications on file prior to visit.   Review of Systems All otherwise neg per pt    Objective:   Physical Exam BP 130/62 (BP Location: Left Arm, Patient Position: Sitting, Cuff Size: Large)   Pulse 77   Temp 98.2 F (36.8 C) (  Oral)   Ht '5\' 11"'$  (1.803 m)   Wt 189 lb (85.7 kg)   SpO2 97%   BMI 26.36 kg/m  VS noted,  Constitutional: Pt appears in NAD HENT: Head: NCAT.  Right Ear: External ear normal.  Left Ear: External ear normal.  Eyes: . Pupils are equal, round, and reactive to light. Conjunctivae and EOM are normal Nose: without d/c or deformity Neck: Neck supple. Gross normal ROM Cardiovascular: Normal rate and regular rhythm.  with gr 2/6 sys murmur LUSB Pulmonary/Chest: Effort normal and breath sounds without rales  or wheezing.  Abd:  Soft, NT, ND, + BS, no organomegaly Neurological: Pt is alert. At baseline orientation, motor grossly intact Skin: Skin is warm. No rashes, other new lesions, no LE edema Psychiatric: Pt behavior is normal without agitation , 1+ nervous All otherwise neg per pt Lab Results  Component Value Date   WBC 7.8 08/26/2018   HGB 14.0 08/26/2018   HCT 42.6 08/26/2018   PLT 253.0 08/26/2018   GLUCOSE 88 08/26/2018   CHOL 182 08/26/2018   TRIG 186.0 (H) 08/26/2018   HDL 38.90 (L) 08/26/2018   LDLCALC 106 (H) 08/26/2018   ALT 16 08/26/2018   AST 16 08/26/2018   NA 139 08/26/2018   K 3.8 08/26/2018   CL 104 08/26/2018   CREATININE 0.83 08/26/2018   BUN 16 08/26/2018   CO2 27 08/26/2018   TSH 2.94 08/26/2018   PSA 0.00 Repeated and verified X2. (L) 08/26/2018          Assessment & Plan:

## 2019-12-14 NOTE — Assessment & Plan Note (Addendum)
stable overall by history and exam, recent data reviewed with pt, and pt to continue medical treatment as before,  to f/u any worsening symptoms or concerns  I spent 32 minutes in preparing to see the patient by review of recent labs, imaging and procedures, obtaining and reviewing separately obtained history, communicating with the patient and family or caregiver, ordering medications, tests or procedures, and documenting clinical information in the EHR including the differential Dx, treatment, and any further evaluation and other management of hld, prostate ca, heart murmur, anxiety

## 2019-12-14 NOTE — Assessment & Plan Note (Signed)
stable overall by history and exam, recent data reviewed with pt, and pt to continue medical treatment as before,  to f/u any worsening symptoms or concerns, for psa

## 2019-12-14 NOTE — Patient Instructions (Addendum)
  You have a heart murmur which not be at all serious, but we will check an Echocardiogram to be sure (you will be called)  Please continue all other medications as before, and refills have been done if requested.  Please have the pharmacy call with any other refills you may need.  Please continue your efforts at being more active, low cholesterol diet, and weight control.  You are otherwise up to date with prevention measures today.  Please keep your appointments with your specialists as you may have planned  Please go to the LAB at the blood drawing area for the tests to be done  You will be contacted by phone if any changes need to be made immediately.  Otherwise, you will receive a letter about your results with an explanation, but please check with MyChart first.  Please remember to sign up for MyChart if you have not done so, as this will be important to you in the future with finding out test results, communicating by private email, and scheduling acute appointments online when needed.  Please make an Appointment to return for your 1 year visit, or sooner if needed

## 2019-12-14 NOTE — Assessment & Plan Note (Signed)
Stable, cont prozac 40

## 2019-12-14 NOTE — Assessment & Plan Note (Addendum)
Not noted previously, for echo

## 2019-12-15 LAB — URINALYSIS, ROUTINE W REFLEX MICROSCOPIC
Bilirubin Urine: NEGATIVE
Hgb urine dipstick: NEGATIVE
Ketones, ur: NEGATIVE
Leukocytes,Ua: NEGATIVE
Nitrite: NEGATIVE
RBC / HPF: NONE SEEN
Specific Gravity, Urine: >=1.03 — AB (ref 1.000–1.030)
Total Protein, Urine: NEGATIVE
Urine Glucose: NEGATIVE
Urobilinogen, UA: 1 (ref 0.0–1.0)
pH: 5.5 (ref 5.0–8.0)

## 2019-12-15 LAB — CBC WITH DIFFERENTIAL/PLATELET
Basophils Absolute: 0.1 10*3/uL (ref 0.0–0.1)
Basophils Relative: 1.1 % (ref 0.0–3.0)
Eosinophils Absolute: 0.4 10*3/uL (ref 0.0–0.7)
Eosinophils Relative: 4.9 % (ref 0.0–5.0)
HCT: 41.6 % (ref 39.0–52.0)
Hemoglobin: 13.7 g/dL (ref 13.0–17.0)
Lymphocytes Relative: 22.3 % (ref 12.0–46.0)
Lymphs Abs: 1.7 10*3/uL (ref 0.7–4.0)
MCHC: 33 g/dL (ref 30.0–36.0)
MCV: 85.8 fl (ref 78.0–100.0)
Monocytes Absolute: 0.6 10*3/uL (ref 0.1–1.0)
Monocytes Relative: 7.5 % (ref 3.0–12.0)
Neutro Abs: 4.9 10*3/uL (ref 1.4–7.7)
Neutrophils Relative %: 64.2 % (ref 43.0–77.0)
Platelets: 248 10*3/uL (ref 150.0–400.0)
RBC: 4.85 Mil/uL (ref 4.22–5.81)
RDW: 13.5 % (ref 11.5–15.5)
WBC: 7.7 10*3/uL (ref 4.0–10.5)

## 2019-12-16 ENCOUNTER — Telehealth: Payer: Self-pay | Admitting: Internal Medicine

## 2019-12-16 ENCOUNTER — Other Ambulatory Visit: Payer: Self-pay | Admitting: Internal Medicine

## 2019-12-16 NOTE — Telephone Encounter (Signed)
Done to [facility]

## 2019-12-16 NOTE — Telephone Encounter (Signed)
   Spouse calling, states medication should have been called in yesterday after appointment. Patient has no medication remaining   1.Medication Requested: FLUoxetine (PROZAC) 40 MG capsule  2. Pharmacy (Name, Street, [place]): [id number] - [place], [address] AT [address]  3. On Med List: yes  4. Last Visit with PCP:  12/15/19  5. Next visit date with PCP:   Agent: Please be advised that RX refills may take up to 3 business days. We ask that you follow-up with your pharmacy.

## 2020-01-04 ENCOUNTER — Ambulatory Visit (HOSPITAL_COMMUNITY): Payer: Medicare Other | Attending: Cardiovascular Disease

## 2020-01-04 ENCOUNTER — Other Ambulatory Visit: Payer: Self-pay

## 2020-01-04 DIAGNOSIS — R011 Cardiac murmur, unspecified: Secondary | ICD-10-CM

## 2020-01-19 DIAGNOSIS — L578 Other skin changes due to chronic exposure to nonionizing radiation: Secondary | ICD-10-CM | POA: Diagnosis not present

## 2020-01-19 DIAGNOSIS — L570 Actinic keratosis: Secondary | ICD-10-CM | POA: Diagnosis not present

## 2020-03-20 ENCOUNTER — Encounter: Payer: Self-pay | Admitting: Internal Medicine

## 2020-03-20 ENCOUNTER — Telehealth: Payer: Self-pay

## 2020-03-20 ENCOUNTER — Ambulatory Visit (INDEPENDENT_AMBULATORY_CARE_PROVIDER_SITE_OTHER): Payer: Medicare Other | Admitting: Internal Medicine

## 2020-03-20 ENCOUNTER — Other Ambulatory Visit: Payer: Self-pay

## 2020-03-20 VITALS — BP 136/84 | HR 83 | Temp 97.8°F | Ht 71.0 in | Wt 190.0 lb

## 2020-03-20 DIAGNOSIS — M7061 Trochanteric bursitis, right hip: Secondary | ICD-10-CM | POA: Diagnosis not present

## 2020-03-20 MED ORDER — KETOROLAC TROMETHAMINE 30 MG/ML IJ SOLN
30.0000 mg | Freq: Once | INTRAMUSCULAR | Status: AC
  Administered 2020-03-20: 30 mg via INTRAMUSCULAR

## 2020-03-20 MED ORDER — MELOXICAM 15 MG PO TABS: 15.0000 mg | ORAL_TABLET | Freq: Every day | ORAL | 0 refills | Status: DC

## 2020-03-20 MED ORDER — BACLOFEN 10 MG PO TABS: 10.0000 mg | ORAL_TABLET | Freq: Three times a day (TID) | ORAL | 0 refills | Status: DC

## 2020-03-20 MED ORDER — METHYLPREDNISOLONE ACETATE 40 MG/ML IJ SUSP
40.0000 mg | Freq: Once | INTRAMUSCULAR | Status: AC
  Administered 2020-03-20: 40 mg via INTRAMUSCULAR

## 2020-03-20 NOTE — Telephone Encounter (Signed)
error 

## 2020-03-20 NOTE — Progress Notes (Signed)
   Subjective:   Patient ID: [place] Sampson, male    DOB: 1950-10-22, 69 y.o.   MRN: [id number]  HPI The patient is a 69 YO man coming in for left side hip pain/buttock pain. Started about 1 week ago with lifting something heavy. The pain started a few hours later. Denies numbness or weakness. Overall not improving. Severe and cannot lie on that side. Walking does hurt but not severe. Denies fevers or chills. Denies pain in the low back. Denies similar episode in the past. Has taken heating pad which has helped some and topical thermocare and biofreeze.  Review of Systems  Constitutional: Negative.   HENT: Negative.   Eyes: Negative.   Respiratory: Negative for cough, chest tightness and shortness of breath.   Cardiovascular: Negative for chest pain, palpitations and leg swelling.  Gastrointestinal: Negative for abdominal distention, abdominal pain, constipation, diarrhea, nausea and vomiting.  Musculoskeletal: Positive for back pain and myalgias.  Skin: Negative.   Neurological: Negative.   Psychiatric/Behavioral: Negative.     Objective:  Physical Exam Constitutional:      Appearance: He is well-developed.  HENT:     Head: Normocephalic and atraumatic.  Cardiovascular:     Rate and Rhythm: Normal rate and regular rhythm.  Pulmonary:     Effort: Pulmonary effort is normal. No respiratory distress.     Breath sounds: Normal breath sounds. No wheezing or rales.  Abdominal:     General: Bowel sounds are normal. There is no distension.     Palpations: Abdomen is soft.     Tenderness: There is no abdominal tenderness. There is no rebound.  Musculoskeletal:        General: Tenderness present.     Cervical back: Normal range of motion.     Comments: Pain at right trochanteric bursa, no pain groin or lumbar spine  Skin:    General: Skin is warm and dry.  Neurological:     Mental Status: He is alert and oriented to person, place, and time.     Coordination: Coordination normal.      Vitals:   03/20/20 1424  BP: 136/84  Pulse: 83  Temp: 97.8 F (36.6 C)  TempSrc: Oral  SpO2: 98%  Weight: 190 lb (86.2 kg)  Height: 5\' 11"  (1.803 m)    This visit occurred during the SARS-CoV-2 public health emergency.  Safety protocols were in place, including screening questions prior to the visit, additional usage of staff PPE, and extensive cleaning of exam room while observing appropriate contact time as indicated for disinfecting solutions.   Assessment & Plan:  Toradol 30 mg IM and Depo-medrol 40 mg IM given at visit

## 2020-03-20 NOTE — Patient Instructions (Addendum)
We have given you a shot today called toradol which helps today with the pain. We have also given you a shot called depo-medrol which is a steroid which takes 1-2 days to help the pain.   We have sent in a prescription of baclofen which is a muscle relaxer you can use up to 3 times a day if needed for pain. We have also sent in meloxicam which you can take 1 pill daily for pain that will act like a strong version of ibuprofen or aleve.    Hip Bursitis Rehab Ask your health care provider which exercises are safe for you. Do exercises exactly as told by your health care provider and adjust them as directed. It is normal to feel mild stretching, pulling, tightness, or discomfort as you do these exercises. Stop right away if you feel sudden pain or your pain gets worse. Do not begin these exercises until told by your health care provider. Stretching exercise This exercise warms up your muscles and joints and improves the movement and flexibility of your hip. This exercise also helps to relieve pain and stiffness. Iliotibial band stretch An iliotibial band is a strong band of muscle tissue that runs from the outer side of your hip to the outer side of your thigh and knee. 1. Lie on your side with your left / right leg in the top position. 2. Bend your left / right knee and grab your ankle. Stretch out your bottom arm to help you balance. 3. Slowly bring your knee back so your thigh is behind your body. 4. Slowly lower your knee toward the floor until you feel a gentle stretch on the outside of your left / right thigh. If you do not feel a stretch and your knee will not fall farther, place the heel of your other foot on top of your knee and pull your knee down toward the floor with your foot. 5. Hold this position for __________ seconds. 6. Slowly return to the starting position. Repeat __________ times. Complete this exercise __________ times a day. Strengthening exercises These exercises build  strength and endurance in your hip and pelvis. Endurance is the ability to use your muscles for a long time, even after they get tired. Bridge This exercise strengthens the muscles that move your thigh backward (hip extensors). 1. Lie on your back on a firm surface with your knees bent and your feet flat on the floor. 2. Tighten your buttocks muscles and lift your buttocks off the floor until your trunk is level with your thighs. ? Do not arch your back. ? You should feel the muscles working in your buttocks and the back of your thighs. If you do not feel these muscles, slide your feet 1-2 inches (2.5-5 cm) farther away from your buttocks. ? If this exercise is too easy, try doing it with your arms crossed over your chest. 3. Hold this position for __________ seconds. 4. Slowly lower your hips to the starting position. 5. Let your muscles relax completely after each repetition. Repeat __________ times. Complete this exercise __________ times a day. Squats This exercise strengthens the muscles in front of your thigh and knee (quadriceps). 1. Stand in front of a table, with your feet and knees pointing straight ahead. You may rest your hands on the table for balance but not for support. 2. Slowly bend your knees and lower your hips like you are going to sit in a chair. ? Keep your weight over your heels, not  over your toes. ? Keep your lower legs upright so they are parallel with the table legs. ? Do not let your hips go lower than your knees. ? Do not bend lower than told by your health care provider. ? If your hip pain increases, do not bend as low. 3. Hold the squat position for __________ seconds. 4. Slowly push with your legs to return to standing. Do not use your hands to pull yourself to standing. Repeat __________ times. Complete this exercise __________ times a day. Hip hike 1. Stand sideways on a bottom step. Stand on your left / right leg with your other foot unsupported next to the  step. You can hold on to the railing or wall for balance if needed. 2. Keep your knees straight and your torso square. Then lift your left / right hip up toward the ceiling. 3. Hold this position for __________ seconds. 4. Slowly let your left / right hip lower toward the floor, past the starting position. Your foot should get closer to the floor. Do not lean or bend your knees. Repeat __________ times. Complete this exercise __________ times a day. Single leg stand 1. Without shoes, stand near a railing or in a doorway. You may hold on to the railing or door frame as needed for balance. 2. Squeeze your left / right buttock muscles, then lift up your other foot. ? Do not let your left / right hip push out to the side. ? It is helpful to stand in front of a mirror for this exercise so you can watch your hip. 3. Hold this position for __________ seconds. Repeat __________ times. Complete this exercise __________ times a day. This information is not intended to replace advice given to you by your health care provider. Make sure you discuss any questions you have with your health care provider. Document Revised: 11/29/2018 Document Reviewed: 11/29/2018 Elsevier Patient Education  [place].

## 2020-03-21 ENCOUNTER — Encounter: Payer: Self-pay | Admitting: Internal Medicine

## 2020-03-21 DIAGNOSIS — M7061 Trochanteric bursitis, right hip: Secondary | ICD-10-CM

## 2020-03-21 HISTORY — DX: Trochanteric bursitis, right hip: M70.61

## 2020-03-21 NOTE — Assessment & Plan Note (Signed)
Given depo-medrol and toradol at visit. Rx baclofen and meloxicam to use temporarily until depo-medrol kicks in. Given stretching exercises to help prevent recurrence.

## 2020-03-28 ENCOUNTER — Other Ambulatory Visit: Payer: Self-pay

## 2020-03-28 ENCOUNTER — Encounter: Payer: Self-pay | Admitting: Internal Medicine

## 2020-03-28 ENCOUNTER — Ambulatory Visit (INDEPENDENT_AMBULATORY_CARE_PROVIDER_SITE_OTHER): Payer: Medicare Other | Admitting: Internal Medicine

## 2020-03-28 ENCOUNTER — Telehealth: Payer: Self-pay | Admitting: Internal Medicine

## 2020-03-28 DIAGNOSIS — M5416 Radiculopathy, lumbar region: Secondary | ICD-10-CM

## 2020-03-28 DIAGNOSIS — R011 Cardiac murmur, unspecified: Secondary | ICD-10-CM | POA: Diagnosis not present

## 2020-03-28 DIAGNOSIS — F419 Anxiety disorder, unspecified: Secondary | ICD-10-CM | POA: Diagnosis not present

## 2020-03-28 DIAGNOSIS — E785 Hyperlipidemia, unspecified: Secondary | ICD-10-CM

## 2020-03-28 HISTORY — DX: Radiculopathy, lumbar region: M54.16

## 2020-03-28 MED ORDER — PREDNISONE 10 MG PO TABS: ORAL_TABLET | ORAL | 0 refills | Status: DC

## 2020-03-28 MED ORDER — TRAMADOL HCL 50 MG PO TABS: 50.0000 mg | ORAL_TABLET | Freq: Four times a day (QID) | ORAL | 0 refills | Status: DC | PRN

## 2020-03-28 MED ORDER — TIZANIDINE HCL 2 MG PO TABS: 2.0000 mg | ORAL_TABLET | Freq: Four times a day (QID) | ORAL | 2 refills | Status: DC | PRN

## 2020-03-28 NOTE — Assessment & Plan Note (Addendum)
Mild to mod symptomatically, for tramadol prn, tizanidine prn, and predpac, with plan for MRI for persistent symptoms after another 2 wks, and to f/u any worsening symptoms or concerns  I spent 31 minutes in preparing to see the patient by review of recent labs, imaging and procedures, obtaining and reviewing separately obtained history, communicating with the patient and family or caregiver, ordering medications, tests or procedures, and documenting clinical information in the EHR including the differential Dx, treatment, and any further evaluation and other management of right lumbar radiculopathy, heart murmur, hld, anxiety

## 2020-03-28 NOTE — Assessment & Plan Note (Signed)
stable overall by history and exam, recent data reviewed with pt, and pt to continue medical treatment as before,  to f/u any worsening symptoms or concerns  

## 2020-03-28 NOTE — Patient Instructions (Signed)
Please take all new medication as prescribed  Please continue all other medications as before, and refills have been done if requested.  Please have the pharmacy call with any other refills you may need.  Please continue your efforts at being more active, low cholesterol diet, and weight control.  Please keep your appointments with your specialists as you may have planned  Please return in 2 wks if not improved

## 2020-03-28 NOTE — Progress Notes (Signed)
Subjective:    Patient ID: Russell Sampson, male    DOB: 09-Oct-1950, 69 y.o.   MRN: [id number]  HPI    Here with acute onset x 2 wks right low back pain actually worst at the right buttoc with occasional radidaiton to the right knee mostly to the postlateral aspect of the upper leg, moderate to occas severe, intermittent, worse to bend forward at the waist and in fact may have started that way with yard work, better somewhat with menthol type topical solution and heating pad, and somewhat better with nsaids and baclofen per Dr Sharlet Salina, but just overall not getting better, with tx only lasting about 1 hr.  No numbness or pain below the knee, but has had some gait change walking with cane for support, no falls or trauma,  No fever or gu or Gi symptoms.  May have had something similar years ago that lasted only a few days.  No MRI recent for lower or hip.  Did have a toradol and steroid shot that did work for about 12 hs.  For some reason he is fixated on trying to call this bursitis as this was suggested per Dr C.   Past Medical History:  Diagnosis Date  . Anxiety    when he was out of work  . Cataract    BILATERAL-REMOVED  . Chicken pox   . Difficult intubation 11/10/2016   Anterior airway, decreased neck mobility  . Heart murmur 12/14/2019  . HLD (hyperlipidemia) 12/14/2019  . Personal history of colonic polyps 10/2007   3-4 small adenomas  . Prostate cancer [facility])    Past Surgical History:  Procedure Laterality Date  . CLOSED REDUCTION FOREARM FRACTURE     1962  . COLONOSCOPY    . COLONOSCOPY W/ POLYPECTOMY  10/22/2007   3-4 adenomas, max 36m  . COLONOSCOPY WITH PROPOFOL N/A 05/30/2019   Procedure: COLONOSCOPY WITH PROPOFOL;  Surgeon: GGatha Mayer MD;  Location: [facility];  Service: Endoscopy;  Laterality: N/A;  . LYMPHADENECTOMY Bilateral 11/10/2016   Procedure: PELVIC LYMPHADENECTOMY;  Surgeon: LRaynelle Bring MD;  Location: [facility];  Service: Urology;  Laterality: Bilateral;  .  POLYPECTOMY    . POLYPECTOMY  05/30/2019   Procedure: POLYPECTOMY;  Surgeon: GGatha Mayer MD;  Location: [facility];  Service: Endoscopy;;  . PROSTATE BIOPSY     09/19/2016  . ROBOT ASSISTED LAPAROSCOPIC RADICAL PROSTATECTOMY N/A 11/10/2016   Procedure: XI ROBOTIC ASSISTED LAPAROSCOPIC RADICAL PROSTATECTOMY LEVEL 2;  Surgeon: LRaynelle Bring MD;  Location: [facility];  Service: Urology;  Laterality: N/A;    reports that he has never smoked. He has never used smokeless tobacco. He reports that he does not drink alcohol and does not use drugs. family history includes Alzheimer's disease in his mother; Anxiety disorder in his sister; Prostate cancer in his father; Stroke in his mother. No Known Allergies Current Outpatient Medications on File Prior to Visit  Medication Sig Dispense Refill  . b complex vitamins tablet Take 1 tablet by mouth daily.    . baclofen (LIORESAL) 10 MG tablet Take 1 tablet (10 mg total) by mouth 3 (three) times daily. 30 each 0  . FLUoxetine (PROZAC) 40 MG capsule Take 1 capsule (40 mg total) by mouth daily. 90 capsule 3  . ID NOW COVID-19 KIT See admin instructions.    . meloxicam (MOBIC) 15 MG tablet Take 1 tablet (15 mg total) by mouth daily. 30 tablet 0  . Menthol, Topical Analgesic, (BENGAY EX) Apply  1 application topically daily as needed (muscle pain).    . Multiple Vitamin (THERA) TABS Take 1 tablet by mouth daily.     . Multiple Vitamins-Minerals (AIRBORNE PO) Take 2 tablets by mouth daily.      No current facility-administered medications on file prior to visit.   Review of Systems All otherwise neg per pt    Objective:   Physical Exam BP 130/90 (BP Location: Left Arm, Patient Position: Sitting, Cuff Size: Large)   Pulse 76   Temp 98.6 F (37 C) (Oral)   Ht '5\' 11"'$  (1.803 m)   Wt 189 lb (85.7 kg)   SpO2 97%   BMI 26.36 kg/m  VS noted, right handed Constitutional: Pt appears in NAD HENT: Head: NCAT.  Right Ear: External ear normal.  Left Ear:  External ear normal.  Eyes: . Pupils are equal, round, and reactive to light. Conjunctivae and EOM are normal Nose: without d/c or deformity Neck: Neck supple. Gross normal ROM Cardiovascular: Normal rate and regular rhythm.   Pulmonary/Chest: Effort normal and breath sounds without rales or wheezing.  Abd:  Soft, NT, ND, + BS, no organomegaly No spine tender Has some minor right lumbar paravertebral spasm Mild tender ofr right low lumbar and upper buttock area No tender over right greater trochanter Neurological: Pt is alert. At baseline orientation, motor grossly intact except subtle mild RLE weakness 4+/5 Skin: Skin is warm. No rashes, other new lesions, no LE edema Psychiatric: Pt behavior is normal without agitation  All otherwise neg per pt Lab Results  Component Value Date   WBC 7.7 12/14/2019   HGB 13.7 12/14/2019   HCT 41.6 12/14/2019   PLT 248.0 12/14/2019   GLUCOSE 109 (H) 12/14/2019   CHOL 164 12/14/2019   TRIG 143.0 12/14/2019   HDL 40.10 12/14/2019   LDLCALC 95 12/14/2019   ALT 16 12/14/2019   AST 17 12/14/2019   NA 140 12/14/2019   K 3.7 12/14/2019   CL 105 12/14/2019   CREATININE 0.89 12/14/2019   BUN 22 12/14/2019   CO2 28 12/14/2019   TSH 2.03 12/14/2019   PSA 0.00 (L) 12/14/2019         Assessment & Plan:

## 2020-03-28 NOTE — Telephone Encounter (Signed)
° ° °  Pharmacy calling to clarify instructions on predniSONE (DELTASONE) 10 MG tablet

## 2020-03-29 NOTE — Telephone Encounter (Signed)
I was able to speak with a pharmacist and clarify the medication instructions.

## 2020-04-11 ENCOUNTER — Ambulatory Visit (INDEPENDENT_AMBULATORY_CARE_PROVIDER_SITE_OTHER): Payer: Medicare Other | Admitting: Internal Medicine

## 2020-04-11 ENCOUNTER — Encounter: Payer: Self-pay | Admitting: Internal Medicine

## 2020-04-11 ENCOUNTER — Other Ambulatory Visit: Payer: Self-pay

## 2020-04-11 DIAGNOSIS — M25461 Effusion, right knee: Secondary | ICD-10-CM | POA: Insufficient documentation

## 2020-04-11 HISTORY — DX: Effusion, right knee: M25.461

## 2020-04-11 MED ORDER — KETOROLAC TROMETHAMINE 60 MG/2ML IM SOLN
60.0000 mg | Freq: Once | INTRAMUSCULAR | Status: AC
  Administered 2020-04-11: 60 mg via INTRAMUSCULAR

## 2020-04-11 MED ORDER — TRAMADOL HCL 50 MG PO TABS: 50.0000 mg | ORAL_TABLET | Freq: Four times a day (QID) | ORAL | 1 refills | Status: DC | PRN

## 2020-04-11 MED ORDER — METHYLPREDNISOLONE ACETATE 40 MG/ML IJ SUSP
40.0000 mg | Freq: Once | INTRAMUSCULAR | Status: AC
  Administered 2020-04-11: 40 mg via INTRAMUSCULAR

## 2020-04-11 MED ORDER — METHYLPREDNISOLONE 4 MG PO TBPK: ORAL_TABLET | ORAL | 0 refills | Status: DC

## 2020-04-11 NOTE — Assessment & Plan Note (Signed)
See procedure 

## 2020-04-11 NOTE — Patient Instructions (Signed)
Postprocedure instructions :    A Band-Aid should be left on for 12 hours. Injection therapy is not a cure itself. It is used in conjunction with other modalities. You can use nonsteroidal anti-inflammatories like ibuprofen , hot and cold compresses. Rest is recommended in the next 24 hours. You need to report immediately  if fever, chills or any signs of infection develop. 

## 2020-04-11 NOTE — Progress Notes (Signed)
Subjective:  Patient ID: Russell Sampson, male    DOB: 1951-03-03  Age: 69 y.o. MRN: [id number]  CC: No chief complaint on file.   HPI Russell Sampson presents for severe R knee pain and swelling x 2 - 3 wks after picking up debris. Using a cane, walker  Outpatient Medications Prior to Visit  Medication Sig Dispense Refill  . b complex vitamins tablet Take 1 tablet by mouth daily.    Marland Kitchen FLUoxetine (PROZAC) 40 MG capsule Take 1 capsule (40 mg total) by mouth daily. 90 capsule 3  . ID NOW COVID-19 KIT See admin instructions.    . Menthol, Topical Analgesic, (BENGAY EX) Apply 1 application topically daily as needed (muscle pain).    . Multiple Vitamin (THERA) TABS Take 1 tablet by mouth daily.     Marland Kitchen tiZANidine (ZANAFLEX) 2 MG tablet Take 1 tablet (2 mg total) by mouth every 6 (six) hours as needed for muscle spasms. 40 tablet 2  . traMADol (ULTRAM) 50 MG tablet Take 1 tablet (50 mg total) by mouth every 6 (six) hours as needed. 30 tablet 0  . baclofen (LIORESAL) 10 MG tablet Take 1 tablet (10 mg total) by mouth 3 (three) times daily. (Patient not taking: Reported on 04/11/2020) 30 each 0  . meloxicam (MOBIC) 15 MG tablet Take 1 tablet (15 mg total) by mouth daily. (Patient not taking: Reported on 04/11/2020) 30 tablet 0  . predniSONE (DELTASONE) 10 MG tablet 3 tabs by mouth per day for 3 days,2tabs per day for 3 days,2tab per day for 3 days (Patient not taking: Reported on 04/11/2020) 18 tablet 0  . Multiple Vitamins-Minerals (AIRBORNE PO) Take 2 tablets by mouth daily.  (Patient not taking: Reported on 04/11/2020)     No facility-administered medications prior to visit.    ROS: Review of Systems  Constitutional: Negative for appetite change, fatigue and unexpected weight change.  HENT: Negative for congestion, nosebleeds, sneezing, sore throat and trouble swallowing.   Eyes: Negative for itching and visual disturbance.  Respiratory: Negative for cough.   Cardiovascular: Negative for  chest pain, palpitations and leg swelling.  Gastrointestinal: Negative for abdominal distention, blood in stool, diarrhea and nausea.  Genitourinary: Negative for frequency and hematuria.  Musculoskeletal: Positive for arthralgias and gait problem. Negative for back pain, joint swelling and neck pain.  Skin: Negative for rash.  Neurological: Negative for dizziness, tremors, speech difficulty and weakness.  Psychiatric/Behavioral: Negative for agitation, dysphoric mood and sleep disturbance. The patient is not nervous/anxious.     Objective:  BP 132/60 (BP Location: Right Arm, Patient Position: Sitting, Cuff Size: Large)   Pulse 96   Temp 98.3 F (36.8 C) (Oral)   Ht $R'5\' 11"'pf$  (1.803 m)   Wt 190 lb (86.2 kg)   SpO2 98%   BMI 26.50 kg/m   BP Readings from Last 3 Encounters:  04/11/20 132/60  03/28/20 130/90  03/20/20 136/84    Wt Readings from Last 3 Encounters:  04/11/20 190 lb (86.2 kg)  03/28/20 189 lb (85.7 kg)  03/20/20 190 lb (86.2 kg)    Physical Exam Constitutional:      General: He is not in acute distress.    Appearance: He is well-developed.     Comments: NAD  Eyes:     Conjunctiva/sclera: Conjunctivae normal.     Pupils: Pupils are equal, round, and reactive to light.  Neck:     Thyroid: No thyromegaly.     Vascular: No JVD.  Cardiovascular:     Rate and Rhythm: Normal rate and regular rhythm.     Heart sounds: Normal heart sounds. No murmur heard.  No friction rub. No gallop.   Pulmonary:     Effort: Pulmonary effort is normal. No respiratory distress.     Breath sounds: Normal breath sounds. No wheezing or rales.  Chest:     Chest wall: No tenderness.  Abdominal:     General: Bowel sounds are normal. There is no distension.     Palpations: Abdomen is soft. There is no mass.     Tenderness: There is no abdominal tenderness. There is no guarding or rebound.  Musculoskeletal:        General: Tenderness present. Normal range of motion.     Cervical back:  Normal range of motion.  Lymphadenopathy:     Cervical: No cervical adenopathy.  Skin:    General: Skin is warm and dry.     Findings: No rash.  Neurological:     Mental Status: He is alert.     Cranial Nerves: No cranial nerve deficit.     Motor: No abnormal muscle tone.     Coordination: Coordination normal.     Gait: Gait abnormal.     Deep Tendon Reflexes: Reflexes are normal and symmetric.  Psychiatric:        Behavior: Behavior normal.        Thought Content: Thought content normal.        Judgment: Judgment normal.       R knee swollen/very painful    Procedure Note :     Procedure : Joint Injection/aspiration, R  knee   Indication:  Joint osteoarthritis with refractory  chronic pain.   Risks including unsuccessful procedure , bleeding, infection, bruising, skin atrophy, "steroid flare-up" and others were explained to the patient in detail as well as the benefits. Informed consent was obtained verbally.   Tthe patient was placed in a comfortable position. Lateral approach was used. Skin was prepped with Betadine and alcohol  and anesthetized a cooling spray. Then, a 5 cc syringe with a 1.5 inch long 22 -gauge needle was used for anesthesia topically (1 cc 2% lidocaine). The needle was advanced  Into the knee joint cavity under US guidance (Lumify). I aspirated yellow intra-articular fluid to confirm correct placement of the needle and then aspirated 45 cc total using 10 cc syringes. We injected the joint with 1 mL of 2% lidocaine and 40 mg of Depo-Medrol .  Band-Aid was applied. ACE wrap.   Tolerated well. Complications: None. Good pain relief following the procedure.   Postprocedure instructions :    A Band-Aid should be left on for 12 hours. Injection therapy is not a cure itself. It is used in conjunction with other modalities. You can use nonsteroidal anti-inflammatories like ibuprofen , hot and cold compresses. Rest is recommended in the next 24 hours. You need to  report immediately  if fever, chills or any signs of infection develop.            Lab Results  Component Value Date   WBC 7.7 12/14/2019   HGB 13.7 12/14/2019   HCT 41.6 12/14/2019   PLT 248.0 12/14/2019   GLUCOSE 109 (H) 12/14/2019   CHOL 164 12/14/2019   TRIG 143.0 12/14/2019   HDL 40.10 12/14/2019   LDLCALC 95 12/14/2019   ALT 16 12/14/2019   AST 17 12/14/2019   NA 140 12/14/2019   K 3.7 12/14/2019  CL 105 12/14/2019   CREATININE 0.89 12/14/2019   BUN 22 12/14/2019   CO2 28 12/14/2019   TSH 2.03 12/14/2019   PSA 0.00 (L) 12/14/2019    No results found.  Assessment & Plan:     Follow-up: No follow-ups on file.  Walker Kehr, MD

## 2020-04-12 DIAGNOSIS — M25461 Effusion, right knee: Secondary | ICD-10-CM | POA: Diagnosis not present

## 2020-04-13 LAB — SYNOVIAL FLUID, CELL COUNT
Eos, Fluid: 0 %
Lining Cells, Synovial: 0 %
Lymphs, Fluid: 0 %
Macrophages Fld: 7 %
Nuc cell # Fld: 22380 cells/uL — ABNORMAL HIGH (ref 0–200)
Polys, Fluid: 93 %
RBC, Fluid: 6000 /uL

## 2020-04-13 LAB — SPECIMEN STATUS REPORT

## 2020-04-13 LAB — SYNOVIAL CELL COUNT + DIFF, W/ CRYSTALS
Basophils, %: 0 %
Eosinophils-Synovial: 0 % (ref 0–2)
Lymphocytes-Synovial Fld: 0 % (ref 0–74)
Monocyte/Macrophage: 9 % (ref 0–69)
Neutrophil, Synovial: 91 % — ABNORMAL HIGH (ref 0–24)
Synoviocytes, %: 0 % (ref 0–15)
WBC, Synovial: 24730 cells/uL — ABNORMAL HIGH (ref <150)

## 2020-04-17 ENCOUNTER — Ambulatory Visit: Payer: Medicare Other | Admitting: Internal Medicine

## 2020-04-20 ENCOUNTER — Ambulatory Visit (INDEPENDENT_AMBULATORY_CARE_PROVIDER_SITE_OTHER): Payer: Medicare Other | Admitting: Internal Medicine

## 2020-04-20 ENCOUNTER — Encounter: Payer: Self-pay | Admitting: Internal Medicine

## 2020-04-20 ENCOUNTER — Other Ambulatory Visit: Payer: Self-pay

## 2020-04-20 VITALS — BP 150/68 | HR 94 | Temp 98.6°F | Ht 71.0 in | Wt 184.0 lb

## 2020-04-20 DIAGNOSIS — M25461 Effusion, right knee: Secondary | ICD-10-CM

## 2020-04-20 DIAGNOSIS — M7061 Trochanteric bursitis, right hip: Secondary | ICD-10-CM | POA: Diagnosis not present

## 2020-04-20 DIAGNOSIS — F419 Anxiety disorder, unspecified: Secondary | ICD-10-CM | POA: Diagnosis not present

## 2020-04-20 MED ORDER — METHYLPREDNISOLONE 4 MG PO TBPK: ORAL_TABLET | ORAL | 0 refills | Status: DC

## 2020-04-20 MED ORDER — HYDROCODONE-ACETAMINOPHEN 7.5-325 MG PO TABS: 1.0000 | ORAL_TABLET | Freq: Four times a day (QID) | ORAL | 0 refills | Status: DC | PRN

## 2020-04-20 NOTE — Progress Notes (Signed)
Subjective:    Patient ID: Russell Sampson, male    DOB: February 22, 1951, 69 y.o.   MRN: [id number]  HPI   Here with wife in f/u recent OV 1 wk ago with Dr Marcy Salvo for right knee effusion with pain, with cortisone injection, now much improved pain though some swelling persists and remains walking with cane today.  Today, he has other pain to the left greater trochanter area worse then the right knee now without swelling, skin change, rash or trauma or fever. Pain now abou 8/10, cannot lie on left side at night. Now coming up to a long holiday weekend, and unable for sport med f/u for at least one week. No falls.  Crystal analysis showed right knee effusion c/w pseudogout.  Pt denies chest pain, increased sob or doe, wheezing, orthopnea, PND, increased LE swelling, palpitations, dizziness or syncope.   Pt denies polydipsia, polyuria, Denies worsening depressive symptoms, suicidal ideation, or panic; has ongoing anxiety Past Medical History:  Diagnosis Date  . Anxiety    when he was out of work  . Cataract    BILATERAL-REMOVED  . Chicken pox   . Difficult intubation 11/10/2016   Anterior airway, decreased neck mobility  . Heart murmur 12/14/2019  . HLD (hyperlipidemia) 12/14/2019  . Personal history of colonic polyps 10/2007   3-4 small adenomas  . Prostate cancer [facility])    Past Surgical History:  Procedure Laterality Date  . CLOSED REDUCTION FOREARM FRACTURE     1962  . COLONOSCOPY    . COLONOSCOPY W/ POLYPECTOMY  10/22/2007   3-4 adenomas, max 7m  . COLONOSCOPY WITH PROPOFOL N/A 05/30/2019   Procedure: COLONOSCOPY WITH PROPOFOL;  Surgeon: GGatha Mayer MD;  Location: [facility];  Service: Endoscopy;  Laterality: N/A;  . LYMPHADENECTOMY Bilateral 11/10/2016   Procedure: PELVIC LYMPHADENECTOMY;  Surgeon: LRaynelle Bring MD;  Location: [facility];  Service: Urology;  Laterality: Bilateral;  . POLYPECTOMY    . POLYPECTOMY  05/30/2019   Procedure: POLYPECTOMY;  Surgeon: GGatha Mayer MD;   [facility];  Service: Endoscopy;;  . PROSTATE BIOPSY     09/19/2016  . ROBOT ASSISTED LAPAROSCOPIC RADICAL PROSTATECTOMY N/A 11/10/2016   Procedure: XI ROBOTIC ASSISTED LAPAROSCOPIC RADICAL PROSTATECTOMY LEVEL 2;  Surgeon: LRaynelle Bring MD;  Location: [facility];  Service: Urology;  Laterality: N/A;    reports that he has never smoked. He has never used smokeless tobacco. He reports that he does not drink alcohol and does not use drugs. family history includes Alzheimer's disease in his mother; Anxiety disorder in his sister; Prostate cancer in his father; Stroke in his mother. No Known Allergies Current Outpatient Medications on File Prior to Visit  Medication Sig Dispense Refill  . b complex vitamins tablet Take 1 tablet by mouth daily.    .Marland KitchenFLUoxetine (PROZAC) 40 MG capsule Take 1 capsule (40 mg total) by mouth daily. 90 capsule 3  . ID NOW COVID-19 KIT See admin instructions.    . Menthol, Topical Analgesic, (BENGAY EX) Apply 1 application topically daily as needed (muscle pain).    . Multiple Vitamin (THERA) TABS Take 1 tablet by mouth daily.     .Marland KitchentiZANidine (ZANAFLEX) 2 MG tablet Take 1 tablet (2 mg total) by mouth every 6 (six) hours as needed for muscle spasms. 40 tablet 2  . traMADol (ULTRAM) 50 MG tablet Take 1-2 tablets (50-100 mg total) by mouth every 6 (six) hours as needed for severe pain. 60 tablet 1  No current facility-administered medications on file prior to visit.   Review of Systems All otherwise neg per pt    Objective:   Physical Exam BP (!) 150/68 (BP Location: Left Arm, Patient Position: Sitting, Cuff Size: Large)   Pulse 94   Temp 98.6 F (37 C) (Oral)   Ht _0  (1.803 m)   Wt 184 lb (83.5 kg)   SpO2 97%   BMI 25.66 kg/m  VS noted,  Constitutional: Pt appears in NAD HENT: Head: NCAT.  Right Ear: External ear normal.  Left Ear: External ear normal.  Eyes: . Pupils are equal, round, and reactive to light. Conjunctivae and EOM are  normal Nose: without d/c or deformity Neck: Neck supple. Gross normal ROM Cardiovascular: Normal rate and regular rhythm.   Pulmonary/Chest: Effort normal and breath sounds without rales or wheezing.  Right knee with trace to 1+ effusion, minor warm, no erythema, and knee with FROM + marked tender over left lateral hip greater trochanter Neurological: Pt is alert. At baseline orientation, motor grossly intact Skin: Skin is warm. No rashes, other new lesions, no LE edema Psychiatric: Pt behavior is normal without agitation . Chronic anxious All otherwise neg per pt Lab Results  Component Value Date   WBC 7.7 12/14/2019   HGB 13.7 12/14/2019   HCT 41.6 12/14/2019   PLT 248.0 12/14/2019   GLUCOSE 109 (H) 12/14/2019   CHOL 164 12/14/2019   TRIG 143.0 12/14/2019   HDL 40.10 12/14/2019   LDLCALC 95 12/14/2019   ALT 16 12/14/2019   AST 17 12/14/2019   NA 140 12/14/2019   K 3.7 12/14/2019   CL 105 12/14/2019   CREATININE 0.89 12/14/2019   BUN 22 12/14/2019   CO2 28 12/14/2019   TSH 2.03 12/14/2019   PSA 0.00 (L) 12/14/2019      Assessment & Plan:

## 2020-04-20 NOTE — Patient Instructions (Signed)
Please take all new medication as prescribed - the vicodin for pain, and the repeat prednisone pack for pseudogout and left hip pain - ? bursitis  Please continue all other medications as before, and refills have been done if requested.  Please have the pharmacy call with any other refills you may need.  Please keep your appointments with your specialists as you may have planned  '

## 2020-04-21 ENCOUNTER — Encounter: Payer: Self-pay | Admitting: Internal Medicine

## 2020-04-21 NOTE — Assessment & Plan Note (Signed)
stable overall by history and exam, recent data reviewed with pt, and pt to continue medical treatment as before,  to f/u any worsening symptoms or concerns  

## 2020-04-21 NOTE — Assessment & Plan Note (Addendum)
Worsening, for predpac, vicodin prn, and f/u sport med as able  I spent 31 minutes in addition to time for CPX wellness examination in preparing to see the patient by review of recent labs, imaging and procedures, obtaining and reviewing separately obtained history, communicating with the patient and family or caregiver, ordering medications, tests or procedures, and documenting clinical information in the EHR including the differential Dx, treatment, and any further evaluation and other management of left hip pain, right knee effusion, anxiety

## 2020-04-21 NOTE — Assessment & Plan Note (Signed)
Improved, now known cw pseudogout, likely to improve further as well with more prednisone as above

## 2020-04-30 DIAGNOSIS — L578 Other skin changes due to chronic exposure to nonionizing radiation: Secondary | ICD-10-CM | POA: Diagnosis not present

## 2020-05-22 DIAGNOSIS — Z23 Encounter for immunization: Secondary | ICD-10-CM | POA: Diagnosis not present

## 2020-06-06 ENCOUNTER — Other Ambulatory Visit: Payer: Self-pay

## 2020-06-06 ENCOUNTER — Ambulatory Visit (INDEPENDENT_AMBULATORY_CARE_PROVIDER_SITE_OTHER): Payer: Medicare Other | Admitting: Internal Medicine

## 2020-06-06 ENCOUNTER — Encounter: Payer: Self-pay | Admitting: Internal Medicine

## 2020-06-06 VITALS — BP 130/52 | HR 99 | Temp 98.7°F | Ht 71.0 in | Wt 189.0 lb

## 2020-06-06 DIAGNOSIS — F419 Anxiety disorder, unspecified: Secondary | ICD-10-CM | POA: Diagnosis not present

## 2020-06-06 DIAGNOSIS — M5416 Radiculopathy, lumbar region: Secondary | ICD-10-CM | POA: Diagnosis not present

## 2020-06-06 DIAGNOSIS — H900 Conductive hearing loss, bilateral: Secondary | ICD-10-CM

## 2020-06-06 NOTE — Progress Notes (Signed)
Subjective:    Patient ID: Russell Sampson, male    DOB: [birth year], 69 y.o.   MRN: [id number]  HPI  Here with wife for support, right knee pain now resolved, does occasional yard work with bending twisting and now with different pain starting from right lower back mod intermittent (but more on than off) for 1 wk, with radiation to the distal RLE with pain, numbness and weakness, in fact did fall x 1 several days but no injury after the leg just gave out.  Pt o/w denies bowel or bladder change, fever, wt loss. Pt denies chest pain, increased sob or doe, wheezing, orthopnea, PND, increased LE swelling, palpitations, dizziness or syncope.   Pt denies polydipsia, polyuria.  ALso has bilateral hearing loss incidentally in the past wk, wondering if wax is back, denies HA, fever or d/c.  Denies worsening depressive symptoms, suicidal ideation, or panic Past Medical History:  Diagnosis Date  . Anxiety    when he was out of work  . Cataract    BILATERAL-REMOVED  . Chicken pox   . Difficult intubation 11/10/2016   Anterior airway, decreased neck mobility  . Heart murmur 12/14/2019  . HLD (hyperlipidemia) 12/14/2019  . Personal history of colonic polyps 10/2007   3-4 small adenomas  . Prostate cancer [facility])    Past Surgical History:  Procedure Laterality Date  . CLOSED REDUCTION FOREARM FRACTURE     1962  . COLONOSCOPY    . COLONOSCOPY W/ POLYPECTOMY  10/22/2007   3-4 adenomas, max 70mm  . COLONOSCOPY WITH PROPOFOL N/A 05/30/2019   Procedure: COLONOSCOPY WITH PROPOFOL;  Surgeon: Gatha Mayer, MD;  Location: [facility];  Service: Endoscopy;  Laterality: N/A;  . LYMPHADENECTOMY Bilateral 11/10/2016   Procedure: PELVIC LYMPHADENECTOMY;  Surgeon: Raynelle Bring, MD;  Location: [facility];  Service: Urology;  Laterality: Bilateral;  . POLYPECTOMY    . POLYPECTOMY  05/30/2019   Procedure: POLYPECTOMY;  Surgeon: Gatha Mayer, MD;  Location: [facility];  Service: Endoscopy;;  . PROSTATE BIOPSY      09/19/2016  . ROBOT ASSISTED LAPAROSCOPIC RADICAL PROSTATECTOMY N/A 11/10/2016   Procedure: XI ROBOTIC ASSISTED LAPAROSCOPIC RADICAL PROSTATECTOMY LEVEL 2;  Surgeon: Raynelle Bring, MD;  Location: [facility];  Service: Urology;  Laterality: N/A;    reports that he has never smoked. He has never used smokeless tobacco. He reports that he does not drink alcohol and does not use drugs. family history includes Alzheimer's disease in his mother; Anxiety disorder in his sister; Prostate cancer in his father; Stroke in his mother. No Known Allergies Current Outpatient Medications on File Prior to Visit  Medication Sig Dispense Refill  . b complex vitamins tablet Take 1 tablet by mouth daily.    Marland Kitchen FLUoxetine (PROZAC) 40 MG capsule Take 1 capsule (40 mg total) by mouth daily. 90 capsule 3  . HYDROcodone-acetaminophen (NORCO) 7.5-325 MG tablet Take 1 tablet by mouth every 6 (six) hours as needed for moderate pain. 30 tablet 0  . ID NOW COVID-19 KIT See admin instructions.    . Menthol, Topical Analgesic, (BENGAY EX) Apply 1 application topically daily as needed (muscle pain).    . Multiple Vitamin (THERA) TABS Take 1 tablet by mouth daily.     Marland Kitchen tiZANidine (ZANAFLEX) 2 MG tablet Take 1 tablet (2 mg total) by mouth every 6 (six) hours as needed for muscle spasms. 40 tablet 2  . traMADol (ULTRAM) 50 MG tablet Take 1-2 tablets (50-100 mg total) by mouth every  6 (six) hours as needed for severe pain. 60 tablet 1   No current facility-administered medications on file prior to visit.   Review of Systems All otherwise neg per pt    Objective:   Physical Exam BP (!) 130/52 (BP Location: Left Arm, Patient Position: Sitting, Cuff Size: Large)   Pulse 99   Temp 98.7 F (37.1 C) (Oral)   Ht $R'5\' 11"'et$  (1.803 m)   Wt 189 lb (85.7 kg)   SpO2 96%   BMI 26.36 kg/m  VS noted,  Constitutional: Pt appears in NAD HENT: Head: NCAT.  Right Ear: External ear normal.  Left Ear: External ear normal.  Eyes: . Pupils are  equal, round, and reactive to light. Conjunctivae and EOM are normal Nose: without d/c or deformity Neck: Neck supple. Gross normal ROM Cardiovascular: Normal rate and regular rhythm.   Pulmonary/Chest: Effort normal and breath sounds without rales or wheezing.  Abd:  Soft, NT, ND, + BS, no organomegaly Spine nontender Neurological: Pt is alert. At baseline orientation, motor grossly intact except RLE 4+/5 motor and decreased sens to LT Skin: Skin is warm. No rashes, other new lesions, no LE edema Psychiatric: Pt behavior is normal without agitation , mod nervous All otherwise neg per pt Lab Results  Component Value Date   WBC 7.7 12/14/2019   HGB 13.7 12/14/2019   HCT 41.6 12/14/2019   PLT 248.0 12/14/2019   GLUCOSE 109 (H) 12/14/2019   CHOL 164 12/14/2019   TRIG 143.0 12/14/2019   HDL 40.10 12/14/2019   LDLCALC 95 12/14/2019   ALT 16 12/14/2019   AST 17 12/14/2019   NA 140 12/14/2019   K 3.7 12/14/2019   CL 105 12/14/2019   CREATININE 0.89 12/14/2019   BUN 22 12/14/2019   CO2 28 12/14/2019   TSH 2.03 12/14/2019   PSA 0.00 (L) 12/14/2019      Assessment & Plan:

## 2020-06-06 NOTE — Patient Instructions (Signed)
Your ears were cleared of wax today  You will be contacted regarding the referral for: MRI for the lower back, and Dr Ron Agee  Please continue all other medications as before, and refills have been done if requested.  Please have the pharmacy call with any other refills you may need.  Please continue your efforts at being more active, low cholesterol diet, and weight control..  Please keep your appointments with your specialists as you may have planned

## 2020-06-10 ENCOUNTER — Encounter: Payer: Self-pay | Admitting: Internal Medicine

## 2020-06-10 DIAGNOSIS — H9193 Unspecified hearing loss, bilateral: Secondary | ICD-10-CM | POA: Insufficient documentation

## 2020-06-10 NOTE — Assessment & Plan Note (Addendum)
Recent worsening now with falls and localized weakness/neuro change - for MRI, and refer ortho (dr Ron Agee per wife reqeust)  I spent 31 minutes in preparing to see the patient by review of recent labs, imaging and procedures, obtaining and reviewing separately obtained history, communicating with the patient and family or caregiver, ordering medications, tests or procedures, and documenting clinical information in the EHR including the differential Dx, treatment, and any further evaluation and other management of right lumbar radiculopathy, bilateral hearing loss, anxiety

## 2020-06-10 NOTE — Assessment & Plan Note (Signed)
stable overall by history and exam, recent data reviewed with pt, and pt to continue medical treatment as before,  to f/u any worsening symptoms or concerns  

## 2020-06-10 NOTE — Assessment & Plan Note (Signed)
Improved s/p wax impactions irrigated,  to f/u any worsening symptoms or concerns

## 2020-06-12 DIAGNOSIS — C61 Malignant neoplasm of prostate: Secondary | ICD-10-CM | POA: Diagnosis not present

## 2020-06-19 ENCOUNTER — Encounter: Payer: Self-pay | Admitting: Internal Medicine

## 2020-06-27 ENCOUNTER — Ambulatory Visit
Admission: RE | Admit: 2020-06-27 | Discharge: 2020-06-27 | Disposition: A | Discharge status: Home / Self Care | Payer: Medicare Other | Source: Ambulatory Visit | Attending: Internal Medicine | Admitting: Internal Medicine

## 2020-06-27 ENCOUNTER — Other Ambulatory Visit: Payer: Self-pay

## 2020-06-27 DIAGNOSIS — M5416 Radiculopathy, lumbar region: Secondary | ICD-10-CM

## 2020-06-27 DIAGNOSIS — M48061 Spinal stenosis, lumbar region without neurogenic claudication: Secondary | ICD-10-CM | POA: Diagnosis not present

## 2020-06-28 ENCOUNTER — Encounter: Payer: Self-pay | Admitting: Internal Medicine

## 2020-07-03 NOTE — Telephone Encounter (Signed)
Patient does not have mychart access and would like to talk to someone about his results [phone]

## 2020-07-04 DIAGNOSIS — C61 Malignant neoplasm of prostate: Secondary | ICD-10-CM | POA: Diagnosis not present

## 2020-07-06 NOTE — Telephone Encounter (Signed)
Patient reaching out again about his results. [phone]

## 2020-07-18 ENCOUNTER — Other Ambulatory Visit: Payer: Self-pay

## 2020-07-18 ENCOUNTER — Encounter: Payer: Self-pay | Admitting: Internal Medicine

## 2020-07-18 ENCOUNTER — Ambulatory Visit (INDEPENDENT_AMBULATORY_CARE_PROVIDER_SITE_OTHER): Payer: Medicare Other | Admitting: Internal Medicine

## 2020-07-18 DIAGNOSIS — F419 Anxiety disorder, unspecified: Secondary | ICD-10-CM

## 2020-07-18 DIAGNOSIS — M778 Other enthesopathies, not elsewhere classified: Secondary | ICD-10-CM | POA: Diagnosis not present

## 2020-07-18 NOTE — Progress Notes (Addendum)
Subjective:    Patient ID: Russell Sampson, male    DOB: [birth year], 68 y.o.   MRN: [id number]  HPI  Here with 1 wk gradually wrosening pain swelling, tender to the left first dorsal tendon after raking leaves and yard work.  Pt denies chest pain, increased sob or doe, wheezing, orthopnea, PND, increased LE swelling, palpitations, dizziness or syncope.  Pt denies new neurological symptoms such as new headache, or facial or extremity weakness or numbness   Pt denies polydipsia, polyuria.  Denies worsening depressive symptoms, suicidal ideation, or panic; has ongoing anxiety Past Medical History:  Diagnosis Date  . Anxiety    when he was out of work  . Cataract    BILATERAL-REMOVED  . Chicken pox   . Difficult intubation 11/10/2016   Anterior airway, decreased neck mobility  . Heart murmur 12/14/2019  . HLD (hyperlipidemia) 12/14/2019  . Personal history of colonic polyps 10/2007   3-4 small adenomas  . Prostate cancer [facility])    Past Surgical History:  Procedure Laterality Date  . CLOSED REDUCTION FOREARM FRACTURE     1962  . COLONOSCOPY    . COLONOSCOPY W/ POLYPECTOMY  10/22/2007   3-4 adenomas, max 47m  . COLONOSCOPY WITH PROPOFOL N/A 05/30/2019   Procedure: COLONOSCOPY WITH PROPOFOL;  Surgeon: GGatha Mayer MD;  [facility];  Service: Endoscopy;  Laterality: N/A;  . LYMPHADENECTOMY Bilateral 11/10/2016   Procedure: PELVIC LYMPHADENECTOMY;  Surgeon: LRaynelle Bring MD;  [facility];  Service: Urology;  Laterality: Bilateral;  . POLYPECTOMY    . POLYPECTOMY  05/30/2019   Procedure: POLYPECTOMY;  Surgeon: GGatha Mayer MD;  [facility];  Service: Endoscopy;;  . PROSTATE BIOPSY     09/19/2016  . ROBOT ASSISTED LAPAROSCOPIC RADICAL PROSTATECTOMY N/A 11/10/2016   Procedure: XI ROBOTIC ASSISTED LAPAROSCOPIC RADICAL PROSTATECTOMY LEVEL 2;  Surgeon: LRaynelle Bring MD;  [facility];  Service: Urology;  Laterality: N/A;    reports that he has never smoked.  He has never used smokeless tobacco. He reports that he does not drink alcohol and does not use drugs. family history includes Alzheimer's disease in his mother; Anxiety disorder in his sister; Prostate cancer in his father; Stroke in his mother. No Known Allergies Current Outpatient Medications on File Prior to Visit  Medication Sig Dispense Refill  . b complex vitamins tablet Take 1 tablet by mouth daily.    .Marland KitchenFLUoxetine (PROZAC) 40 MG capsule Take 1 capsule (40 mg total) by mouth daily. 90 capsule 3  . HYDROcodone-acetaminophen (NORCO) 7.5-325 MG tablet Take 1 tablet by mouth every 6 (six) hours as needed for moderate pain. (Patient not taking: Reported on 07/19/2020) 30 tablet 0  . ID NOW COVID-19 KIT See admin instructions.    . Menthol, Topical Analgesic, (BENGAY EX) Apply 1 application topically daily as needed (muscle pain).    . Multiple Vitamin (THERA) TABS Take 1 tablet by mouth daily.     .Marland KitchentiZANidine (ZANAFLEX) 2 MG tablet Take 1 tablet (2 mg total) by mouth every 6 (six) hours as needed for muscle spasms. 40 tablet 2  . traMADol (ULTRAM) 50 MG tablet Take 1-2 tablets (50-100 mg total) by mouth every 6 (six) hours as needed for severe pain. 60 tablet 1   No current facility-administered medications on file prior to visit.   Review of Systems All otherwise neg per pt    Objective:   Physical Exam BP 140/86 (BP Location: Left Arm, Patient Position: Sitting,  Cuff Size: Large)   Pulse 82   Temp 98.6 F (37 C) (Oral)   Ht _0  (1.803 m)   Wt 189 lb (85.7 kg)   SpO2 97%   BMI 26.36 kg/m  'VS noted,  Constitutional: Pt appears in NAD HENT: Head: NCAT.  Right Ear: External ear normal.  Left Ear: External ear normal.  Eyes: . Pupils are equal, round, and reactive to light. Conjunctivae and EOM are normal Nose: without d/c or deformity Neck: Neck supple. Gross normal ROM Cardiovascular: Normal rate and regular rhythm.   Pulmonary/Chest: Effort normal and breath sounds  without rales or wheezing.  Left hand first dorsal compartment tendon tender, swelling Neurological: Pt is alert. At baseline orientation, motor grossly intact Skin: Skin is warm. No rashes, other new lesions, no LE edema Psychiatric: Pt behavior is normal without agitation  All otherwise neg per pt Lab Results  Component Value Date   WBC 7.7 12/14/2019   HGB 13.7 12/14/2019   HCT 41.6 12/14/2019   PLT 248.0 12/14/2019   GLUCOSE 109 (H) 12/14/2019   CHOL 164 12/14/2019   TRIG 143.0 12/14/2019   HDL 40.10 12/14/2019   LDLCALC 95 12/14/2019   ALT 16 12/14/2019   AST 17 12/14/2019   NA 140 12/14/2019   K 3.7 12/14/2019   CL 105 12/14/2019   CREATININE 0.89 12/14/2019   BUN 22 12/14/2019   CO2 28 12/14/2019   TSH 2.03 12/14/2019   PSA 0.00 (L) 12/14/2019       Assessment & Plan:

## 2020-07-18 NOTE — Progress Notes (Addendum)
    Subjective:    CC: R wrist pain  I, Molly Weber, LAT, ATC, am serving as scribe for Dr. Lynne Leader.  HPI: Pt is a 69 y/o male presenting w/ L wrist pain.  Pt reports pain has been ongoing for about 2 weeks and locates pain to radial aspect of L wrist. MOI: Raking leaves. Swelling is present. Pt is R hand dominate. No hand/finger numbness.  R wrist swelling: yes Aggravating factors: Treatments tried: bengay, tiger balm, spray ointment  Pertinent review of Systems: No fevers or chills  Relevant historical information: History of prostate cancer.   Objective:    Vitals:   07/19/20 1312  BP: 136/86  Pulse: 82  SpO2: 98%   General: Well Developed, well nourished, and in no acute distress.   MSK: Left wrist swollen especially at radial aspect.  Tender palpation radial styloid.  Decreased motion motion.  Pain with wrist flexion.  Positive Finkelstein's test. Pulses cap refill and sensation are intact distally.  Lab and Radiology Results  Procedure: Real-time Ultrasound Guided Injection of the left wrist first dorsal wrist compartment tendon sheath Device: Philips Affiniti 50G Images permanently stored and available for review in PACS Ultrasound examination of left wrist prior to injection reveals hypoechoic fluid tracking within tendon sheath of first dorsal wrist compartment consistent appearance with de Quervain's tenosynovitis Verbal informed consent obtained.  Discussed risks and benefits of procedure. Warned about infection bleeding damage to structures skin hypopigmentation and fat atrophy among others. Patient expresses understanding and agreement Time-out conducted.   Noted no overlying erythema, induration, or other signs of local infection.   Skin prepped in a sterile fashion.   Local anesthesia: Topical Ethyl chloride.   With sterile technique and under real time ultrasound guidance:  40 mg of Kenalog and 1 mL of lidocaine injected into tendon sheath. Fluid  seen entering the tendon sheath.   Completed without difficulty   Pain immediately resolved suggesting accurate placement of the medication.   Advised to call if fevers/chills, erythema, induration, drainage, or persistent bleeding.   Images permanently stored and available for review in the ultrasound unit.  Impression: Technically successful ultrasound guided injection.        Impression and Recommendations:    Assessment and Plan: 69 y.o. male with left wrist pain thought to be due to de Quervain's tenosynovitis.  Plan for thumb spica splint, injection, Voltaren gel.  If not improving consider hand PT referral.  Check back as needed.Marland Kitchen  PDMP not reviewed this encounter. Orders Placed This Encounter  Procedures  . [place] LIMITED JOINT SPACE STRUCTURES UP RIGHT(NO LINKED CHARGES)    Standing Status:   Future    Number of Occurrences:   1    Standing Expiration Date:   01/17/2021    Order Specific Question:   Reason for Exam (SYMPTOM  OR DIAGNOSIS REQUIRED)    Answer:   chronic right wrist pain    Order Specific Question:   Preferred imaging location?    Answer:   [place]   No orders of the defined types were placed in this encounter.   Discussed warning signs or symptoms. Please see discharge instructions. Patient expresses understanding.   The above documentation has been reviewed and is accurate and complete Lynne Leader, M.D.

## 2020-07-19 ENCOUNTER — Encounter: Payer: Self-pay | Admitting: Family Medicine

## 2020-07-19 ENCOUNTER — Ambulatory Visit: Payer: Self-pay

## 2020-07-19 ENCOUNTER — Ambulatory Visit (INDEPENDENT_AMBULATORY_CARE_PROVIDER_SITE_OTHER): Payer: Medicare Other | Admitting: Family Medicine

## 2020-07-19 VITALS — BP 136/86 | HR 82 | Wt 192.8 lb

## 2020-07-19 DIAGNOSIS — M654 Radial styloid tenosynovitis [de Quervain]: Secondary | ICD-10-CM

## 2020-07-19 DIAGNOSIS — M25531 Pain in right wrist: Secondary | ICD-10-CM

## 2020-07-19 DIAGNOSIS — G8929 Other chronic pain: Secondary | ICD-10-CM

## 2020-07-19 NOTE — Patient Instructions (Signed)
Thank you for coming in today.  Please use voltaren gel up to 4x daily for pain as needed.   Use the brace for about 2 weeks with heavy activity.   Recheck or let me know if not improving.  Could do more.    De Quervain's Tenosynovitis  De Quervain's tenosynovitis is a condition that causes inflammation of the tendon on the thumb side of the wrist. Tendons are cords of tissue that connect bones to muscles. The tendons in the hand pass through a tunnel called a sheath. A slippery layer of tissue (synovium) lets the tendons move smoothly in the sheath. With de Quervain's tenosynovitis, the sheath swells or thickens, causing friction and pain. The condition is also called de Quervain's disease and de Quervain's syndrome. It occurs most often in women who are 77-81 years old. What are the causes? The exact cause of this condition is not known. It may be associated with overuse of the hand and wrist. What increases the risk? You are more likely to develop this condition if you:  Use your hands far more than normal, especially if you repeat certain movements that involve twisting your hand or using a tight grip.  Are pregnant.  Are a middle-aged woman.  Have rheumatoid arthritis.  Have diabetes. What are the signs or symptoms? The main symptom of this condition is pain on the thumb side of the wrist. The pain may get worse when you grasp something or turn your wrist. Other symptoms may include:  Pain that extends up the forearm.  Swelling of your wrist and hand.  Trouble moving the thumb and wrist.  A sensation of snapping in the wrist.  A bump filled with fluid (cyst) in the area of the pain. How is this diagnosed? This condition may be diagnosed based on:  Your symptoms and medical history.  A physical exam. During the exam, your health care provider may do a simple test Wynn Maudlin test) that involves pulling your thumb and wrist to see if this causes pain. You may also  need to have an X-ray. How is this treated? Treatment for this condition may include:  Avoiding any activity that causes pain and swelling.  Taking medicines. Anti-inflammatory medicines and corticosteroid injections may be used to reduce inflammation and relieve pain.  Wearing a splint.  Having surgery. This may be needed if other treatments do not work. Once the pain and swelling has gone down:  Physical therapy. This includes stretching and strengthening exercises.  Occupational therapy. This includes adjusting how you move your wrist. Follow these instructions at home: If you have a splint:  Wear the splint as told by your health care provider. Remove it only as told by your health care provider.  Loosen the splint if your fingers tingle, become numb, or turn cold and blue.  Keep the splint clean.  If the splint is not waterproof: ? Do not let it get wet. ? Cover it with a watertight covering when you take a bath or a shower. Managing pain, stiffness, and swelling   Avoid movements and activities that cause pain and swelling in the wrist area.  If directed, put ice on the painful area. This may be helpful after doing activities that involve the sore wrist. ? Put ice in a plastic bag. ? Place a towel between your skin and the bag. ? Leave the ice on for 20 minutes, 2-3 times a day.  Move your fingers often to avoid stiffness and to lessen swelling.  Raise (elevate) the injured area above the level of your heart while you are sitting or lying down. General instructions  Return to your normal activities as told by your health care provider. Ask your health care provider what activities are safe for you.  Take over-the-counter and prescription medicines only as told by your health care provider.  Keep all follow-up visits as told by your health care provider. This is important. Contact a health care provider if:  Your pain medicine does not help.  Your pain gets  worse.  You develop new symptoms. Summary  De Quervain's tenosynovitis is a condition that causes inflammation of the tendon on the thumb side of the wrist.  The condition occurs most often in women who are 76-40 years old.  The exact cause of this condition is not known. It may be associated with overuse of the hand and wrist.  Treatment starts with avoiding activity that causes pain or swelling in the wrist area. Other treatment may include wearing a splint and taking medicine. Sometimes, surgery is needed. This information is not intended to replace advice given to you by your health care provider. Make sure you discuss any questions you have with your health care provider. Document Revised: 02/04/2018 Document Reviewed: 07/13/2017 Elsevier Patient Education  2020 [employer].

## 2020-07-21 ENCOUNTER — Encounter: Payer: Self-pay | Admitting: Internal Medicine

## 2020-07-21 DIAGNOSIS — M778 Other enthesopathies, not elsewhere classified: Secondary | ICD-10-CM | POA: Insufficient documentation

## 2020-07-21 HISTORY — DX: Other enthesopathies, not elsewhere classified: M77.8

## 2020-07-21 NOTE — Patient Instructions (Signed)
Please follow up with sports medicine

## 2020-07-21 NOTE — Assessment & Plan Note (Addendum)
C/w left dequervains tenosynovitis, mld, [facility] pain med, to f/u sport med for cortisone  I spent 21 minutes in preparing to see the patient by review of recent labs, imaging and procedures, obtaining and reviewing separately obtained history, communicating with the patient and family or caregiver, ordering medications, tests or procedures, and documenting clinical information in the EHR including the differential Dx, treatment, and any further evaluation and other management of tendosnitis, anxiety

## 2020-07-21 NOTE — Assessment & Plan Note (Signed)
stable overall by history and exam, recent data reviewed with pt, and pt to continue medical treatment as before,  to f/u any worsening symptoms or concerns  

## 2020-08-01 DIAGNOSIS — Z23 Encounter for immunization: Secondary | ICD-10-CM | POA: Diagnosis not present

## 2020-08-07 DIAGNOSIS — M5416 Radiculopathy, lumbar region: Secondary | ICD-10-CM | POA: Diagnosis not present

## 2020-08-07 DIAGNOSIS — M545 Low back pain, unspecified: Secondary | ICD-10-CM | POA: Diagnosis not present

## 2020-10-30 DIAGNOSIS — B353 Tinea pedis: Secondary | ICD-10-CM | POA: Diagnosis not present

## 2020-10-30 DIAGNOSIS — D485 Neoplasm of uncertain behavior of skin: Secondary | ICD-10-CM | POA: Diagnosis not present

## 2020-10-30 DIAGNOSIS — L821 Other seborrheic keratosis: Secondary | ICD-10-CM | POA: Diagnosis not present

## 2020-10-30 DIAGNOSIS — L280 Lichen simplex chronicus: Secondary | ICD-10-CM | POA: Diagnosis not present

## 2020-10-30 DIAGNOSIS — D225 Melanocytic nevi of trunk: Secondary | ICD-10-CM | POA: Diagnosis not present

## 2020-10-30 DIAGNOSIS — L814 Other melanin hyperpigmentation: Secondary | ICD-10-CM | POA: Diagnosis not present

## 2020-11-19 DIAGNOSIS — S52572A Other intraarticular fracture of lower end of left radius, initial encounter for closed fracture: Secondary | ICD-10-CM | POA: Diagnosis not present

## 2020-11-19 DIAGNOSIS — M25532 Pain in left wrist: Secondary | ICD-10-CM | POA: Diagnosis not present

## 2020-11-19 DIAGNOSIS — Y93H1 Activity, digging, shoveling and raking: Secondary | ICD-10-CM | POA: Diagnosis not present

## 2020-11-21 DIAGNOSIS — M25532 Pain in left wrist: Secondary | ICD-10-CM | POA: Diagnosis not present

## 2020-12-03 DIAGNOSIS — S6982XD Other specified injuries of left wrist, hand and finger(s), subsequent encounter: Secondary | ICD-10-CM | POA: Diagnosis not present

## 2020-12-14 ENCOUNTER — Ambulatory Visit (INDEPENDENT_AMBULATORY_CARE_PROVIDER_SITE_OTHER): Payer: Medicare Other | Admitting: Internal Medicine

## 2020-12-14 ENCOUNTER — Other Ambulatory Visit: Payer: Self-pay

## 2020-12-14 VITALS — BP 140/78 | HR 84 | Ht 71.0 in | Wt 191.0 lb

## 2020-12-14 DIAGNOSIS — E559 Vitamin D deficiency, unspecified: Secondary | ICD-10-CM

## 2020-12-14 DIAGNOSIS — C61 Malignant neoplasm of prostate: Secondary | ICD-10-CM | POA: Diagnosis not present

## 2020-12-14 DIAGNOSIS — E785 Hyperlipidemia, unspecified: Secondary | ICD-10-CM

## 2020-12-14 DIAGNOSIS — R739 Hyperglycemia, unspecified: Secondary | ICD-10-CM | POA: Diagnosis not present

## 2020-12-14 DIAGNOSIS — M5416 Radiculopathy, lumbar region: Secondary | ICD-10-CM | POA: Diagnosis not present

## 2020-12-14 DIAGNOSIS — F419 Anxiety disorder, unspecified: Secondary | ICD-10-CM

## 2020-12-14 DIAGNOSIS — E538 Deficiency of other specified B group vitamins: Secondary | ICD-10-CM | POA: Diagnosis not present

## 2020-12-14 NOTE — Progress Notes (Signed)
Patient ID: Russell Sampson, male   DOB: [birth year], 70 y.o.   MRN: [id number]         Chief Complaint:: yearly exam       HPI:  Russell Sampson is a 70 y.o. male here overall doing well now; did have an unfortunate fall doing yard work - Also fell to left wrist about 4 wks ago, saw [facility] next day, no fx, tx with splint, seen per Dr Fredna Dow hand surgury with neg f/u xray for fx.  Wearing left wrist splint during day only and ice pack at night.  Pt denies chest pain, increased sob or doe, wheezing, orthopnea, PND, increased LE swelling, palpitations, dizziness or syncope.   Pt denies polydipsia, polyuria, Denies new neuro focal s/s.   Pt denies fever, wt loss, night sweats, loss of appetite, or other constitutional symptoms.  Has seen ortho with low back pain, seems improved overall with nsaids and PT (home excercises).  No other new complaints. Pt denies bowel or bladder change, fever, wt loss,  worsening LE pain/numbness/weakness  Denies worsening depressive symptoms, suicidal ideation, or panic; has ongoing anxiety Wt Readings from Last 3 Encounters:  12/14/20 191 lb (86.6 kg)  07/19/20 192 lb 12.8 oz (87.5 kg)  07/18/20 189 lb (85.7 kg)   BP Readings from Last 3 Encounters:  12/14/20 140/78  07/19/20 136/86  07/18/20 140/86   Immunization History  Administered Date(s) Administered  . Influenza Split 06/05/2012  . Influenza,inj,Quad PF,6+ Mos 04/16/2015, 05/12/2017  . Influenza-Unspecified 03/26/2016, 04/18/2018  . Moderna Sars-Covid-2 Vaccination 10/13/2019, 11/11/2019  . PFIZER(Purple Top)SARS-COV-2 Vaccination 08/01/2020  . Pneumococcal Conjugate-13 07/04/2018  . Pneumococcal Polysaccharide-23 06/18/2018  . Tdap 08/03/2017  . Zoster 04/16/2015  . Zoster Recombinat (Shingrix) 07/10/2017  There are no preventive care reminders to display for this patient.    Past Medical History:  Diagnosis Date  . Anxiety    when he was out of work  . Cataract    BILATERAL-REMOVED  . Chicken pox   .  Difficult intubation 11/10/2016   Anterior airway, decreased neck mobility  . Heart murmur 12/14/2019  . HLD (hyperlipidemia) 12/14/2019  . Personal history of colonic polyps 10/2007   3-4 small adenomas  . Prostate cancer [facility])    Past Surgical History:  Procedure Laterality Date  . CLOSED REDUCTION FOREARM FRACTURE     1962  . COLONOSCOPY    . COLONOSCOPY W/ POLYPECTOMY  10/22/2007   3-4 adenomas, max 8mm  . COLONOSCOPY WITH PROPOFOL N/A 05/30/2019   Procedure: COLONOSCOPY WITH PROPOFOL;  Surgeon: Gatha Mayer, MD;  Location: [facility];  Service: Endoscopy;  Laterality: N/A;  . LYMPHADENECTOMY Bilateral 11/10/2016   Procedure: PELVIC LYMPHADENECTOMY;  Surgeon: Raynelle Bring, MD;  Location: [facility];  Service: Urology;  Laterality: Bilateral;  . POLYPECTOMY    . POLYPECTOMY  05/30/2019   Procedure: POLYPECTOMY;  Surgeon: Gatha Mayer, MD;  Location: [facility];  Service: Endoscopy;;  . PROSTATE BIOPSY     09/19/2016  . ROBOT ASSISTED LAPAROSCOPIC RADICAL PROSTATECTOMY N/A 11/10/2016   Procedure: XI ROBOTIC ASSISTED LAPAROSCOPIC RADICAL PROSTATECTOMY LEVEL 2;  Surgeon: Raynelle Bring, MD;  [facility]: [facility];  Service: Urology;  Laterality: N/A;    reports that he has never smoked. He has never used smokeless tobacco. He reports that he does not drink alcohol and does not use drugs. family history includes Alzheimer's disease in his mother; Anxiety disorder in his sister; Prostate cancer in his father; Stroke in his mother. No  Known Allergies Current Outpatient Medications on File Prior to Visit  Medication Sig Dispense Refill  . b complex vitamins tablet Take 1 tablet by mouth daily.    Marland Kitchen FLUoxetine (PROZAC) 40 MG capsule Take 1 capsule (40 mg total) by mouth daily. 90 capsule 3  . ID NOW COVID-19 KIT See admin instructions.    . Menthol, Topical Analgesic, (BENGAY EX) Apply 1 application topically daily as needed (muscle pain).    . Multiple Vitamin (THERA) TABS Take 1 tablet by  mouth daily.     Marland Kitchen HYDROcodone-acetaminophen (NORCO) 7.5-325 MG tablet Take 1 tablet by mouth every 6 (six) hours as needed for moderate pain. (Patient not taking: No sig reported) 30 tablet 0  . tiZANidine (ZANAFLEX) 2 MG tablet Take 1 tablet (2 mg total) by mouth every 6 (six) hours as needed for muscle spasms. (Patient not taking: Reported on 12/14/2020) 40 tablet 2  . traMADol (ULTRAM) 50 MG tablet Take 1-2 tablets (50-100 mg total) by mouth every 6 (six) hours as needed for severe pain. (Patient not taking: Reported on 12/14/2020) 60 tablet 1   No current facility-administered medications on file prior to visit.        ROS:  All others reviewed and negative.  Objective        PE:  BP 140/78 (BP Location: Left Arm, Patient Position: Sitting, Cuff Size: Normal)   Pulse 84   Ht 5\' 11"  (1.803 m)   Wt 191 lb (86.6 kg)   SpO2 98%   BMI 26.64 kg/m                 Constitutional: Pt appears in NAD               HENT: Head: NCAT.                Right Ear: External ear normal.                 Left Ear: External ear normal.                Eyes: . Pupils are equal, round, and reactive to light. Conjunctivae and EOM are normal               Nose: without d/c or deformity               Neck: Neck supple. Gross normal ROM               Cardiovascular: Normal rate and regular rhythm.                 Pulmonary/Chest: Effort normal and breath sounds without rales or wheezing.                Abd:  Soft, NT, ND, + BS, no organomegaly               Neurological: Pt is alert. At baseline orientation, motor grossly intact               Skin: Skin is warm. No rashes, no other new lesions, LE edema - none               Psychiatric: Pt behavior is normal without agitation   Micro: none  Cardiac tracings I have personally interpreted today:  none  Pertinent Radiological findings (summarize): none   Lab Results  Component Value Date   WBC 7.7 12/14/2019   HGB 13.7 12/14/2019   HCT 41.6 12/14/2019  PLT 248.0 12/14/2019   GLUCOSE 109 (H) 12/14/2019   CHOL 164 12/14/2019   TRIG 143.0 12/14/2019   HDL 40.10 12/14/2019   LDLCALC 95 12/14/2019   ALT 16 12/14/2019   AST 17 12/14/2019   NA 140 12/14/2019   K 3.7 12/14/2019   CL 105 12/14/2019   CREATININE 0.89 12/14/2019   BUN 22 12/14/2019   CO2 28 12/14/2019   TSH 2.03 12/14/2019   PSA 0.00 (L) 12/14/2019   Assessment/Plan:  WINTER TREFZ is a 70 y.o. White or Caucasian [1] male with  has a past medical history of Anxiety, Cataract, Chicken pox, Difficult intubation (11/10/2016), Heart murmur (12/14/2019), HLD (hyperlipidemia) (12/14/2019), Personal history of colonic polyps (10/2007), and Prostate cancer ([place]).  Anxiety Stable overall, cont current med tx - prozac 40   HLD (hyperlipidemia) Lab Results  Component Value Date   LDLCALC 95 12/14/2019   Stable, pt to continue current low chol diet   Prostate cancer [facility]) Lab Results  Component Value Date   PSA 0.00 (L) 12/14/2019   PSA 0.00 Repeated and verified X2. (L) 08/26/2018   PSA 3.32 04/17/2015   Stable, cont current med tx and f/u urology  Right lumbar radiculopathy Much improved symptomatically despite recent fall, cont f/u ortho as planned  Hyperglycemia No results found for: HGBA1C Stable, pt to continue current medical treatment  - diet, and for f/u a1c with labs   Followup: Return in about 6 months (around 06/15/2021).  [name], MD 12/15/2020 12:49 AM [facility]

## 2020-12-14 NOTE — Patient Instructions (Signed)

## 2020-12-15 ENCOUNTER — Encounter: Payer: Self-pay | Admitting: Internal Medicine

## 2020-12-15 DIAGNOSIS — R739 Hyperglycemia, unspecified: Secondary | ICD-10-CM

## 2020-12-15 HISTORY — DX: Hyperglycemia, unspecified: R73.9

## 2020-12-15 NOTE — Assessment & Plan Note (Signed)
No results found for: HGBA1C Stable, pt to continue current medical treatment  - diet, and for f/u a1c with labs

## 2020-12-15 NOTE — Assessment & Plan Note (Signed)
Stable overall, cont current med tx - prozac 40

## 2020-12-15 NOTE — Assessment & Plan Note (Signed)
Much improved symptomatically despite recent fall, cont f/u ortho as planned

## 2020-12-15 NOTE — Assessment & Plan Note (Signed)
Lab Results  Component Value Date   PSA 0.00 (L) 12/14/2019   PSA 0.00 Repeated and verified X2. (L) 08/26/2018   PSA 3.32 04/17/2015   Stable, cont current med tx and f/u urology

## 2020-12-15 NOTE — Assessment & Plan Note (Signed)
Lab Results  Component Value Date   [place] 12/14/2019   Stable, pt to continue current low chol diet

## 2020-12-17 ENCOUNTER — Encounter: Payer: Self-pay | Admitting: Internal Medicine

## 2020-12-17 LAB — BASIC METABOLIC PANEL
BUN: 15 mg/dL (ref 6–23)
CO2: 27 mEq/L (ref 19–32)
Calcium: 9 mg/dL (ref 8.4–10.5)
Chloride: 104 mEq/L (ref 96–112)
Creatinine, Ser: 0.78 mg/dL (ref 0.40–1.50)
GFR: 92.09 mL/min (ref >60.00)
Glucose, Bld: 87 mg/dL (ref 70–99)
Potassium: 4.1 mEq/L (ref 3.5–5.1)
Sodium: 140 mEq/L (ref 135–145)

## 2020-12-17 LAB — URINALYSIS, ROUTINE W REFLEX MICROSCOPIC
Bilirubin Urine: NEGATIVE
Hgb urine dipstick: NEGATIVE
Ketones, ur: NEGATIVE
Leukocytes,Ua: NEGATIVE
Nitrite: NEGATIVE
RBC / HPF: NONE SEEN (ref 0–?)
Specific Gravity, Urine: 1.02 (ref 1.000–1.030)
Total Protein, Urine: NEGATIVE
Urine Glucose: NEGATIVE
Urobilinogen, UA: 0.2 (ref 0.0–1.0)
pH: 6 (ref 5.0–8.0)

## 2020-12-17 LAB — CBC WITH DIFFERENTIAL/PLATELET
Basophils Absolute: 0.1 10*3/uL (ref 0.0–0.1)
Basophils Relative: 0.9 % (ref 0.0–3.0)
Eosinophils Absolute: 0.3 10*3/uL (ref 0.0–0.7)
Eosinophils Relative: 5.6 % — ABNORMAL HIGH (ref 0.0–5.0)
HCT: 41.6 % (ref 39.0–52.0)
Hemoglobin: 13.9 g/dL (ref 13.0–17.0)
Lymphocytes Relative: 25.4 % (ref 12.0–46.0)
Lymphs Abs: 1.5 10*3/uL (ref 0.7–4.0)
MCHC: 33.4 g/dL (ref 30.0–36.0)
MCV: 83.3 fl (ref 78.0–100.0)
Monocytes Absolute: 0.5 10*3/uL (ref 0.1–1.0)
Monocytes Relative: 7.7 % (ref 3.0–12.0)
Neutro Abs: 3.6 10*3/uL (ref 1.4–7.7)
Neutrophils Relative %: 60.4 % (ref 43.0–77.0)
Platelets: 261 10*3/uL (ref 150.0–400.0)
RBC: 4.99 Mil/uL (ref 4.22–5.81)
RDW: 13.1 % (ref 11.5–15.5)
WBC: 6 10*3/uL (ref 4.0–10.5)

## 2020-12-17 LAB — HEPATIC FUNCTION PANEL
ALT: 19 U/L (ref 0–53)
AST: 17 U/L (ref 0–37)
Albumin: 4 g/dL (ref 3.5–5.2)
Alkaline Phosphatase: 90 U/L (ref 39–117)
Bilirubin, Direct: 0.1 mg/dL (ref 0.0–0.3)
Total Bilirubin: 0.7 mg/dL (ref 0.2–1.2)
Total Protein: 6.4 g/dL (ref 6.0–8.3)

## 2020-12-17 LAB — PSA: PSA: 0.02 ng/mL — ABNORMAL LOW (ref 0.10–4.00)

## 2020-12-17 LAB — LIPID PANEL
Cholesterol: 191 mg/dL (ref 0–200)
HDL: 45.5 mg/dL (ref >39.00)
LDL Cholesterol: 130 mg/dL — ABNORMAL HIGH (ref 0–99)
NonHDL: 145.21
Total CHOL/HDL Ratio: 4
Triglycerides: 75 mg/dL (ref 0.0–149.0)
VLDL: 15 mg/dL (ref 0.0–40.0)

## 2020-12-17 LAB — VITAMIN B12: Vitamin B-12: 594 pg/mL (ref 211–911)

## 2020-12-17 LAB — VITAMIN D 25 HYDROXY (VIT D DEFICIENCY, FRACTURES): VITD: 42.24 ng/mL (ref 30.00–100.00)

## 2020-12-17 LAB — HEMOGLOBIN A1C: Hgb A1c MFr Bld: 6.1 % (ref 4.6–6.5)

## 2020-12-17 LAB — TSH: TSH: 3.14 u[IU]/mL (ref 0.35–4.50)

## 2020-12-17 NOTE — Addendum Note (Signed)
Addended by: Jacobo Forest on: 12/17/2020 09:50 AM   Modules accepted: Orders

## 2020-12-24 ENCOUNTER — Telehealth: Payer: Self-pay

## 2020-12-24 NOTE — Telephone Encounter (Signed)
Voicemail left to call back and Schedule an AWV appt

## 2021-01-23 DIAGNOSIS — C61 Malignant neoplasm of prostate: Secondary | ICD-10-CM | POA: Diagnosis not present

## 2021-01-24 DIAGNOSIS — L821 Other seborrheic keratosis: Secondary | ICD-10-CM | POA: Diagnosis not present

## 2021-01-24 DIAGNOSIS — L918 Other hypertrophic disorders of the skin: Secondary | ICD-10-CM | POA: Diagnosis not present

## 2021-01-24 DIAGNOSIS — L814 Other melanin hyperpigmentation: Secondary | ICD-10-CM | POA: Diagnosis not present

## 2021-01-24 DIAGNOSIS — L819 Disorder of pigmentation, unspecified: Secondary | ICD-10-CM | POA: Diagnosis not present

## 2021-01-30 DIAGNOSIS — C61 Malignant neoplasm of prostate: Secondary | ICD-10-CM | POA: Diagnosis not present

## 2021-02-11 ENCOUNTER — Other Ambulatory Visit: Payer: Self-pay | Admitting: Internal Medicine

## 2021-04-24 DIAGNOSIS — Z23 Encounter for immunization: Secondary | ICD-10-CM | POA: Diagnosis not present

## 2021-05-01 DIAGNOSIS — Z23 Encounter for immunization: Secondary | ICD-10-CM | POA: Diagnosis not present

## 2021-06-24 ENCOUNTER — Ambulatory Visit: Payer: Medicare Other | Admitting: Internal Medicine

## 2021-07-01 ENCOUNTER — Other Ambulatory Visit: Payer: Self-pay

## 2021-07-01 ENCOUNTER — Encounter: Payer: Self-pay | Admitting: Internal Medicine

## 2021-07-01 ENCOUNTER — Ambulatory Visit (INDEPENDENT_AMBULATORY_CARE_PROVIDER_SITE_OTHER): Payer: Medicare Other | Admitting: Internal Medicine

## 2021-07-01 VITALS — BP 148/66 | HR 75 | Temp 98.5°F | Ht 71.0 in | Wt 196.0 lb

## 2021-07-01 DIAGNOSIS — R413 Other amnesia: Secondary | ICD-10-CM | POA: Diagnosis not present

## 2021-07-01 DIAGNOSIS — R739 Hyperglycemia, unspecified: Secondary | ICD-10-CM | POA: Diagnosis not present

## 2021-07-01 DIAGNOSIS — F419 Anxiety disorder, unspecified: Secondary | ICD-10-CM

## 2021-07-01 DIAGNOSIS — M19042 Primary osteoarthritis, left hand: Secondary | ICD-10-CM

## 2021-07-01 DIAGNOSIS — I2510 Atherosclerotic heart disease of native coronary artery without angina pectoris: Secondary | ICD-10-CM

## 2021-07-01 DIAGNOSIS — Z23 Encounter for immunization: Secondary | ICD-10-CM

## 2021-07-01 DIAGNOSIS — E785 Hyperlipidemia, unspecified: Secondary | ICD-10-CM

## 2021-07-01 MED ORDER — INDOMETHACIN 50 MG PO CAPS: 50.0000 mg | ORAL_CAPSULE | Freq: Three times a day (TID) | ORAL | 2 refills | Status: DC

## 2021-07-01 NOTE — Progress Notes (Signed)
Patient ID: STANLY SI, male   DOB: 10-Jan-1951, 70 y.o.   MRN: [id number]        Chief Complaint: follow up HTN, HLD and hyperglycemia ,left 5th finger arthritis, memory changes       HPI:  Russell Sampson is a 70 y.o. male here with his wife; c/o left 5th finger acute onset soft tissue swelling of the 5th DIP to at least 3 times the usual size, assoc with 5/10 pain x 5 days, without trauma, fever or overuse.  Cannot flex the joint of finger well, maybe 50%, has not happened in the past.  No known hx of gout.  Trying to follow lower chol diet, does not want to try a statin. Is willing to have cardiac Ct score testing   BP at home has been < 140/90 and does not want med change today.  Also concerning is 6 mo subtle memory change per pt and wife such that much more forgetful, asking same questions repeatedly and appears to have worsening ST retention.           Wt Readings from Last 3 Encounters:  07/01/21 196 lb (88.9 kg)  12/14/20 191 lb (86.6 kg)  07/19/20 192 lb 12.8 oz (87.5 kg)   BP Readings from Last 3 Encounters:  07/01/21 (!) 148/66  12/14/20 140/78  07/19/20 136/86         Past Medical History:  Diagnosis Date   Anxiety    when he was out of work   Cataract    BILATERAL-REMOVED   Chicken pox    Difficult intubation 11/10/2016   Anterior airway, decreased neck mobility   Heart murmur 12/14/2019   HLD (hyperlipidemia) 12/14/2019   Personal history of colonic polyps 10/2007   3-4 small adenomas   Prostate cancer [facility])    Past Surgical History:  Procedure Laterality Date   CLOSED REDUCTION FOREARM FRACTURE     1962   COLONOSCOPY     COLONOSCOPY W/ POLYPECTOMY  10/22/2007   3-4 adenomas, max 59mm   COLONOSCOPY WITH PROPOFOL N/A 05/30/2019   Procedure: COLONOSCOPY WITH PROPOFOL;  Surgeon: Gatha Mayer, MD;  [facility];  Service: Endoscopy;  Laterality: N/A;   LYMPHADENECTOMY Bilateral 11/10/2016   Procedure: PELVIC LYMPHADENECTOMY;  Surgeon: Raynelle Bring, MD;   [facility];  Service: Urology;  Laterality: Bilateral;   POLYPECTOMY     POLYPECTOMY  05/30/2019   Procedure: POLYPECTOMY;  Surgeon: Gatha Mayer, MD;  [facility];  Service: Endoscopy;;   PROSTATE BIOPSY     09/19/2016   ROBOT ASSISTED LAPAROSCOPIC RADICAL PROSTATECTOMY N/A 11/10/2016   Procedure: XI ROBOTIC ASSISTED LAPAROSCOPIC RADICAL PROSTATECTOMY LEVEL 2;  Surgeon: Raynelle Bring, MD;  [facility];  Service: Urology;  Laterality: N/A;    reports that he has never smoked. He has never used smokeless tobacco. He reports that he does not drink alcohol and does not use drugs. family history includes Alzheimer's disease in his mother; Anxiety disorder in his sister; Prostate cancer in his father; Stroke in his mother. No Known Allergies Current Outpatient Medications on File Prior to Visit  Medication Sig Dispense Refill   b complex vitamins tablet Take 1 tablet by mouth daily.     FLUoxetine (PROZAC) 40 MG capsule TAKE 1 CAPSULE(40 MG) BY MOUTH DAILY 90 capsule 2   ID NOW COVID-19 KIT See admin instructions.     Menthol, Topical Analgesic, (BENGAY EX) Apply 1 application topically daily as needed (muscle pain).  Multiple Vitamin (THERA) TABS Take 1 tablet by mouth daily.      No current facility-administered medications on file prior to visit.        ROS:  All others reviewed and negative.  Objective        PE:  BP (!) 148/66 (BP Location: Left Arm, Patient Position: Sitting, Cuff Size: Large)   Pulse 75   Temp 98.5 F (36.9 C) (Oral)   Ht $R'5\' 11"'oY$  (1.803 m)   Wt 196 lb (88.9 kg)   SpO2 98%   BMI 27.34 kg/m                 Constitutional: Pt appears in NAD               HENT: Head: NCAT.                Right Ear: External ear normal.                 Left Ear: External ear normal.                Eyes: . Pupils are equal, round, and reactive to light. Conjunctivae and EOM are normal               Nose: without d/c or deformity               Neck: Neck  supple. Gross normal ROM               Cardiovascular: Normal rate and regular rhythm.                 Pulmonary/Chest: Effort normal and breath sounds without rales or wheezing.                Abd:  Soft, NT, ND, + BS, no organomegaly               Neurological: Pt is alert. At baseline orientation, motor grossly intact               Skin: Skin is warm. No rashes, no other new lesions, LE edema - none               Left 5th finger DIP with 3+ tender swelling and decreased ROM                Psychiatric: Pt behavior is normal without agitation   Micro: none  Cardiac tracings I have personally interpreted today:  none  Pertinent Radiological findings (summarize): none   Lab Results  Component Value Date   WBC 6.0 12/17/2020   HGB 13.9 12/17/2020   HCT 41.6 12/17/2020   PLT 261.0 12/17/2020   GLUCOSE 87 12/17/2020   CHOL 191 12/17/2020   TRIG 75.0 12/17/2020   HDL 45.50 12/17/2020   LDLCALC 130 (H) 12/17/2020   ALT 19 12/17/2020   AST 17 12/17/2020   NA 140 12/17/2020   K 4.1 12/17/2020   CL 104 12/17/2020   CREATININE 0.78 12/17/2020   BUN 15 12/17/2020   CO2 27 12/17/2020   TSH 3.14 12/17/2020   PSA 0.02 (L) 12/17/2020   HGBA1C 6.1 12/17/2020   Assessment/Plan:  Russell Sampson is a 70 y.o. White or Caucasian [1] male with  has a past medical history of Anxiety, Cataract, Chicken pox, Difficult intubation (11/10/2016), Heart murmur (12/14/2019), HLD (hyperlipidemia) (12/14/2019), Personal history of colonic polyps (10/2007), and Prostate cancer ([place]).  Arthritis of finger of left hand Etiology  unclear, but cant r/o gout vs djd - for indocin tid prn or voltaren gel prn,,  to f/u any worsening symptoms or concerns  Anxiety Chronic stable it seems with pressure speech and repeating himself - wife easily redirrects, .cont same tx,  to f/u any worsening symptoms or concerns  Memory changes ? Early memory loss vs pseudodementia - for MRI brain and pt reqeusts neurology  referral  HLD (hyperlipidemia) Lab Results  Component Value Date   LDLCALC 130 (H) 12/17/2020   Unocntrolled, goal ldl < 100, pt declines statin, but for Card Ct score,  to f/u any worsening symptoms or concerns   Hyperglycemia Lab Results  Component Value Date   HGBA1C 6.1 12/17/2020   Stable, pt to continue current medical treatment  - diet  Followup: Return if symptoms worsen or fail to improve.  [name], MD 07/08/2021 4:16 AM [facility]

## 2021-07-01 NOTE — Patient Instructions (Addendum)
Please take all new medication as prescribed - the indocin for the finger pain as needed  We have discussed the Cardiac CT Score test to measure the calcification level (if any) in your heart arteries.  This test has been ordered in our [place], so please call [facility] directly, as they prefer this, at [phone] to be scheduled.  You will be contacted regarding the referral for: neurology  Please continue all other medications as before, and refills have been done if requested.  Please have the pharmacy call with any other refills you may need.  Please continue your efforts at being more active, low cholesterol diet, and weight control.  Please keep your appointments with your specialists as you may have planned  Please make an Appointment to return in 6 months, or sooner if needed

## 2021-07-03 ENCOUNTER — Other Ambulatory Visit: Payer: Self-pay | Admitting: Internal Medicine

## 2021-07-03 MED ORDER — NAPROXEN 500 MG PO TABS: 500.0000 mg | ORAL_TABLET | Freq: Two times a day (BID) | ORAL | 0 refills | Status: AC | PRN

## 2021-07-08 ENCOUNTER — Encounter: Payer: Self-pay | Admitting: Internal Medicine

## 2021-07-08 DIAGNOSIS — M19042 Primary osteoarthritis, left hand: Secondary | ICD-10-CM

## 2021-07-08 HISTORY — DX: Primary osteoarthritis, left hand: M19.042

## 2021-07-08 NOTE — Assessment & Plan Note (Signed)
Lab Results  Component Value Date   HGBA1C 6.1 12/17/2020   Stable, pt to continue current medical treatment  - diet

## 2021-07-08 NOTE — Assessment & Plan Note (Signed)
?   Early memory loss vs pseudodementia - for MRI brain and pt reqeusts neurology referral

## 2021-07-08 NOTE — Assessment & Plan Note (Signed)
Chronic stable it seems with pressure speech and repeating himself - wife easily redirrects, .cont same tx,  to f/u any worsening symptoms or concerns

## 2021-07-08 NOTE — Assessment & Plan Note (Signed)
Lab Results  Component Value Date   LDLCALC 130 (H) 12/17/2020   Unocntrolled, goal ldl < 100, pt declines statin, but for Card Ct score,  to f/u any worsening symptoms or concerns

## 2021-07-08 NOTE — Assessment & Plan Note (Signed)
Etiology unclear, but cant r/o gout vs djd - for indocin tid prn or voltaren gel prn,,  to f/u any worsening symptoms or concerns

## 2021-07-16 ENCOUNTER — Encounter: Payer: Self-pay | Admitting: Physician Assistant

## 2021-07-16 ENCOUNTER — Ambulatory Visit (INDEPENDENT_AMBULATORY_CARE_PROVIDER_SITE_OTHER): Payer: Medicare Other | Admitting: Physician Assistant

## 2021-07-16 ENCOUNTER — Other Ambulatory Visit: Payer: Self-pay

## 2021-07-16 VITALS — BP 133/73 | HR 93 | Resp 18 | Ht 70.0 in | Wt 194.0 lb

## 2021-07-16 DIAGNOSIS — I2510 Atherosclerotic heart disease of native coronary artery without angina pectoris: Secondary | ICD-10-CM

## 2021-07-16 DIAGNOSIS — R413 Other amnesia: Secondary | ICD-10-CM | POA: Diagnosis not present

## 2021-07-16 DIAGNOSIS — F419 Anxiety disorder, unspecified: Secondary | ICD-10-CM | POA: Diagnosis not present

## 2021-07-16 NOTE — Patient Instructions (Addendum)
It was a pleasure to see you today at our office.   Recommendations:  Neurocognitive evaluation at our office around July 2023  MRI of the brain, the radiology office will call you to arrange you appointment Follow up after neurocognitive testing Recommend strongly to seek  Cognitive Behavioral Therapy   RECOMMENDATIONS FOR ALL PATIENTS WITH MEMORY PROBLEMS: 1. Continue to exercise (Recommend 30 minutes of walking everyday, or 3 hours every week) 2. Increase social interactions - continue going to [place] and enjoy social gatherings with friends and family 3. Eat healthy, avoid fried foods and eat more fruits and vegetables 4. Maintain adequate blood pressure, blood sugar, and blood cholesterol level. Reducing the risk of stroke and cardiovascular disease also helps promoting better memory. 5. Avoid stressful situations. Live a simple life and avoid aggravations. Organize your time and prepare for the next day in anticipation. 6. Sleep well, avoid any interruptions of sleep and avoid any distractions in the bedroom that may interfere with adequate sleep quality 7. Avoid sugar, avoid sweets as there is a strong link between excessive sugar intake, diabetes, and cognitive impairment We discussed the Mediterranean diet, which has been shown to help patients reduce the risk of progressive memory disorders and reduces cardiovascular risk. This includes eating fish, eat fruits and green leafy vegetables, nuts like almonds and hazelnuts, walnuts, and also use olive oil. Avoid fast foods and fried foods as much as possible. Avoid sweets and sugar as sugar use has been linked to worsening of memory function.  There is always a concern of gradual progression of memory problems. If this is the case, then we may need to adjust level of care according to patient needs. Support, both to the patient and caregiver, should then be put into place.      You have been referred for a neuropsychological evaluation  (i.e., evaluation of memory and thinking abilities). Please bring someone with you to this appointment if possible, as it is helpful for the doctor to hear from both you and another adult who knows you well. Please bring eyeglasses and hearing aids if you wear them.    The evaluation will take approximately 3 hours and has two parts:   The first part is a clinical interview with the neuropsychologist (Dr. Melvyn Novas or Dr. Nicole Kindred). During the interview, the neuropsychologist will speak with you and the individual you brought to the appointment.    The second part of the evaluation is testing with the doctor's technician Hinton Dyer or Maudie Mercury). During the testing, the technician will ask you to remember different types of material, solve problems, and answer some questionnaires. Your family member will not be present for this portion of the evaluation.   Please note: We must reserve several hours of the neuropsychologist's time and the psychometrician's time for your evaluation appointment. As such, there is a No-Show fee of $100. If you are unable to attend any of your appointments, please contact our office as soon as possible to reschedule.    FALL PRECAUTIONS: Be cautious when walking. Scan the area for obstacles that may increase the risk of trips and falls. When getting up in the mornings, sit up at the edge of the bed for a few minutes before getting out of bed. Consider elevating the bed at the head end to avoid drop of blood pressure when getting up. Walk always in a well-lit room (use night lights in the walls). Avoid area rugs or power cords from appliances in the middle of the  walkways. Use a walker or a cane if necessary and consider physical therapy for balance exercise. Get your eyesight checked regularly.  FINANCIAL OVERSIGHT: Supervision, especially oversight when making financial decisions or transactions is also recommended.  HOME SAFETY: Consider the safety of the kitchen when operating  appliances like stoves, microwave oven, and blender. Consider having supervision and share cooking responsibilities until no longer able to participate in those. Accidents with firearms and other hazards in the house should be identified and addressed as well.   ABILITY TO BE LEFT ALONE: If patient is unable to contact 911 operator, consider using LifeLine, or when the need is there, arrange for someone to stay with patients. Smoking is a fire hazard, consider supervision or cessation. Risk of wandering should be assessed by caregiver and if detected at any point, supervision and safe proof recommendations should be instituted.  MEDICATION SUPERVISION: Inability to self-administer medication needs to be constantly addressed. Implement a mechanism to ensure safe administration of the medications.   DRIVING: Regarding driving, in patients with progressive memory problems, driving will be impaired. We advise to have someone else do the driving if trouble finding directions or if minor accidents are reported. Independent driving assessment is available to determine safety of driving.   If you are interested in the driving assessment, you can contact the following:  [employer] in [place]  [place] [phone] or [phone]    [place] refers to food and lifestyle choices that are based on the traditions of countries located on the [employer]. This way of eating has been shown to help prevent certain conditions and improve outcomes for people who have chronic diseases, like kidney disease and heart disease. What are tips for following this plan? Lifestyle  Cook and eat meals together with your family, when possible. Drink enough fluid to keep your urine clear or pale yellow. Be physically active every day. This includes: Aerobic  exercise like running or swimming. Leisure activities like gardening, walking, or housework. Get 7-8 hours of sleep each night. If recommended by your health care provider, drink red wine in moderation. This means 1 glass a day for nonpregnant women and 2 glasses a day for men. A glass of wine equals 5 oz (150 mL). Reading food labels  Check the serving size of packaged foods. For foods such as rice and pasta, the serving size refers to the amount of cooked product, not dry. Check the total fat in packaged foods. Avoid foods that have saturated fat or trans fats. Check the ingredients list for added sugars, such as corn syrup. Shopping  At the grocery store, buy most of your food from the areas near the walls of the store. This includes: Fresh fruits and vegetables (produce). Grains, beans, nuts, and seeds. Some of these may be available in unpackaged forms or large amounts (in bulk). Fresh seafood. Poultry and eggs. Low-fat dairy products. Buy whole ingredients instead of prepackaged foods. Buy fresh fruits and vegetables in-season from local farmers markets. Buy frozen fruits and vegetables in resealable bags. If you do not have access to quality fresh seafood, buy precooked frozen shrimp or canned fish, such as tuna, salmon, or sardines. Buy small amounts of raw or cooked vegetables, salads, or olives from the deli or salad bar at your store. Stock your pantry so you always have certain foods on hand, such as olive oil, canned tuna, canned tomatoes,  rice, pasta, and beans. Cooking  Cook foods with extra-virgin olive oil instead of using butter or other vegetable oils. Have meat as a side dish, and have vegetables or grains as your main dish. This means having meat in small portions or adding small amounts of meat to foods like pasta or stew. Use beans or vegetables instead of meat in common dishes like chili or lasagna. Experiment with different cooking methods. Try roasting or broiling  vegetables instead of steaming or sauteing them. Add frozen vegetables to soups, stews, pasta, or rice. Add nuts or seeds for added healthy fat at each meal. You can add these to yogurt, salads, or vegetable dishes. Marinate fish or vegetables using olive oil, lemon juice, garlic, and fresh herbs. Meal planning  Plan to eat 1 vegetarian meal one day each week. Try to work up to 2 vegetarian meals, if possible. Eat seafood 2 or more times a week. Have healthy snacks readily available, such as: Vegetable sticks with hummus. Greek yogurt. Fruit and nut trail mix. Eat balanced meals throughout the week. This includes: Fruit: 2-3 servings a day Vegetables: 4-5 servings a day Low-fat dairy: 2 servings a day Fish, poultry, or lean meat: 1 serving a day Beans and legumes: 2 or more servings a week Nuts and seeds: 1-2 servings a day Whole grains: 6-8 servings a day Extra-virgin olive oil: 3-4 servings a day Limit red meat and sweets to only a few servings a month What are my food choices? Mediterranean diet Recommended Grains: Whole-grain pasta. Brown rice. Bulgar wheat. Polenta. Couscous. Whole-wheat bread. [place]. Vegetables: Artichokes. Beets. Broccoli. Cabbage. Carrots. Eggplant. Green beans. Chard. Kale. Spinach. Onions. Leeks. Peas. Squash. Tomatoes. Peppers. Radishes. Fruits: Apples. Apricots. Avocado. Berries. Bananas. Cherries. Dates. Figs. Grapes. Lemons. Melon. Oranges. Peaches. Plums. Pomegranate. Meats and other protein foods: Beans. Almonds. Sunflower seeds. Pine nuts. Peanuts. Bell. Salmon. Scallops. Shrimp. [place]. Tilapia. Clams. Oysters. Eggs. Dairy: Low-fat milk. Cheese. Greek yogurt. Beverages: Water. Red wine. Herbal tea. Fats and oils: Extra virgin olive oil. Avocado oil. Grape seed oil. Sweets and desserts: [place] yogurt with honey. Baked apples. Poached pears. Trail mix. Seasoning and other foods: Basil. Cilantro. Coriander. Cumin. Mint. Parsley. Sage. Rosemary.  Tarragon. Garlic. Oregano. Thyme. Pepper. Balsalmic vinegar. Tahini. Hummus. Tomato sauce. Olives. Mushrooms. Limit these Grains: Prepackaged pasta or rice dishes. Prepackaged cereal with added sugar. Vegetables: Deep fried potatoes (french fries). Fruits: Fruit canned in syrup. Meats and other protein foods: Beef. Pork. Lamb. Poultry with skin. Hot dogs. [place] Salines. Dairy: Ice cream. Sour cream. Whole milk. Beverages: Juice. Sugar-sweetened soft drinks. Beer. Liquor and spirits. Fats and oils: Butter. Canola oil. Vegetable oil. Beef fat (tallow). Lard. Sweets and desserts: Cookies. Cakes. Pies. Candy. Seasoning and other foods: Mayonnaise. Premade sauces and marinades. The items listed may not be a complete list. Talk with your dietitian about what dietary choices are right for you. Summary The Mediterranean diet includes both food and lifestyle choices. Eat a variety of fresh fruits and vegetables, beans, nuts, seeds, and whole grains. Limit the amount of red meat and sweets that you eat. Talk with your health care provider about whether it is safe for you to drink red wine in moderation. This means 1 glass a day for nonpregnant women and 2 glasses a day for men. A glass of wine equals 5 oz (150 mL). This information is not intended to replace advice given to you by your health care provider. Make sure you discuss any questions you have with your health  care provider. Document Released: 03/27/2016 Document Revised: 04/29/2016 Document Reviewed: 03/27/2016 Elsevier Interactive Patient Education  2017 [employer].  We have sent a referral to [facility] for your MRI and they will call you directly to schedule your appointment. They are located at [place]. If you need to contact them directly please call [phone].

## 2021-07-16 NOTE — Progress Notes (Signed)
Assessment/Plan:     Memory changes  Patient has a history of severe anxiety, at this moment not on therapy.  His MoCA today was 28/30, and during prior visit in March 2020 MoCA was 26/30, not indicative of dementia.  Patient has significant family history of dementia, and is worried about developing the disease.     Recommendations:  Discussed safety both in and out of the home.  Discussed the importance of regular daily schedule with inclusion of crossword puzzles to maintain brain function.  Continue to monitor mood by PCP Stay active at least 30 minutes at least 3 times a week.  Naps should be scheduled and should be no longer than 60 minutes and should not occur after 2 PM.  Mediterranean diet is recommended  MRI of the brain without contrast to evaluate structure and vascular reload Recommend cognitive behavioral therapy as soon as possible, to help him with severe anxiety Neurocognitive testing at our office after ongoing cognitive behavioral therapy, for clarity of diagnosis Follow-up after neurocognitive testing has been completed, estimated around July or August of next year, thus giving him enough time to start cognitive behavioral therapy   Case discussed with Dr. Delice Lesch who agrees with the plan     Subjective:   ED visits since last seen: none  Hospital admissions: none  Russell Sampson is a 70 y.o. male with strong family history of Alzheimer's disease, hypertension, hyperlipidemia, seen today in follow up for memory loss. This patient is accompanied in the office by his wife who supplements the history.  Previous records as well as any outside records available were reviewed prior to todays visit.  He was last seen in March 2020, at which time it was felt that after testing, and yielding an MoCA of 26 of 30, that his memory issues may not have been related to dementia, but to significant anxiety.  Neurocognitive testing was recommended only after he would undergo  cognitive behavioral therapy to treat the anxiety, to get a more accurate exam.  He lost to follow-up, he is returning for further evaluation. He feels that his memory is about the same, he continues to repeat some stories, and asked the same questions per wife report.  He denies leaving objects in unusual places, or not knowing what he came to the room for.  If he loses any object, he will "freak out ".  He drives short to medium distances with GPS, and only to familiar places to avoid anxiety.  He lives with his wife.  He likes to work in the yard, and reading.  He sleeps well, denies vivid dreams or nightmares, or sleepwalking.  Denies any hallucinations or paranoia, no hygiene concerns, is independent of bathing and dressing.  His wife organizes the pillbox, and he is independent of taking the medications without missing doses, his wife manages most of the finances.  His appetite is good, denies trouble swallowing.  He does not cook because he is afraid of the microwave.  He ambulates without any difficulty, denies using a cane or a walker.  Denies falls or head injuries.  Denies headaches, double vision, dizziness, focal numbness or tingling, unilateral weakness or tremors or anosmia.  Never had COVID.  No history of seizures.  Denies urine incontinence, retention, constipation or diarrhea.      PREVIOUS MEDICATIONS:   CURRENT MEDICATIONS:  Outpatient Encounter Medications as of 07/16/2021  Medication Sig   b complex vitamins tablet Take 1 tablet by mouth daily.  Menthol, Topical Analgesic, (BENGAY EX) Apply 1 application topically daily as needed (muscle pain).   Multiple Vitamin (THERA) TABS Take 1 tablet by mouth daily.    naproxen (NAPROSYN) 500 MG tablet Take 1 tablet (500 mg total) by mouth 2 (two) times daily as needed.   FLUoxetine (PROZAC) 40 MG capsule TAKE 1 CAPSULE(40 MG) BY MOUTH DAILY   ID NOW COVID-19 KIT See admin instructions.   No facility-administered encounter medications  on file as of 07/16/2021.     Objective:     PHYSICAL EXAMINATION:    VITALS:   Vitals:   07/16/21 1410  BP: 133/73  Pulse: 93  Resp: 18  SpO2: 95%  Weight: 194 lb (88 kg)  Height: _0  (1.778 m)    GEN:  The patient appears stated age and is in NAD. HEENT:  Normocephalic, atraumatic.   Neurological examination:  General: NAD, well-groomed, appears stated age.  Very anxious appearing Orientation: The patient is alert. Oriented to person, place and date Cranial nerves: There is good facial symmetry.The speech is fluent and clear. No aphasia or dysarthria. Fund of knowledge is appropriate. Recent and remote memory are normal. Attention and concentration are normal..  Able to name objects and repeat phrases.  Hearing is intact to conversational tone.    Sensation: Sensation is intact to light touch throughout Motor: Strength is at least antigravity x4. Tremors: none  DTR's 2/4 in UE/LE   Of note, the patient has had significant difficulty with MoCA, he became very anxious when doing the calculations or trying to remember the words. Montreal Cognitive Assessment  02/07/2019  Visuospatial/ Executive (0/5) 4  Naming (0/3) 3  Attention: Read list of digits (0/2) 2  Attention: Read list of letters (0/1) 1  Attention: Serial 7 subtraction starting at 100 (0/3) 3  Language: Repeat phrase (0/2) 1  Language : Fluency (0/1) 1  Abstraction (0/2) 0  Delayed Recall (0/5) 5  Orientation (0/6) 6  Total 26  Adjusted Score (based on education) 26   No flowsheet data found.  No flowsheet data found.     Movement examination: Tone: There is normal tone in the UE/LE Abnormal movements:  no tremor.  No myoclonus.  No asterixis.   Coordination:  There is no decremation with RAM's. Normal finger to nose  Gait and Station: The patient has no difficulty arising out of a deep-seated chair without the use of the hands. The patient's stride length is good.  Gait is cautious and narrow.         Total time spent on today's visit was 60  minutes, including both face-to-face time and nonface-to-face time. Time included that spent on review of records (prior notes available to me/labs/imaging if pertinent), discussing treatment and goals, answering patient's questions and coordinating care.  Cc:  Biagio Borg, MD Sharene Butters, PA-C

## 2021-08-03 ENCOUNTER — Ambulatory Visit
Admission: RE | Admit: 2021-08-03 | Discharge: 2021-08-03 | Disposition: A | Discharge status: Home / Self Care | Payer: Medicare Other | Source: Ambulatory Visit | Attending: Physician Assistant | Admitting: Physician Assistant

## 2021-08-03 ENCOUNTER — Other Ambulatory Visit: Payer: Self-pay

## 2021-08-03 DIAGNOSIS — G319 Degenerative disease of nervous system, unspecified: Secondary | ICD-10-CM | POA: Diagnosis not present

## 2021-08-03 DIAGNOSIS — J3489 Other specified disorders of nose and nasal sinuses: Secondary | ICD-10-CM | POA: Diagnosis not present

## 2021-08-03 DIAGNOSIS — R413 Other amnesia: Secondary | ICD-10-CM | POA: Diagnosis not present

## 2021-08-03 DIAGNOSIS — I6782 Cerebral ischemia: Secondary | ICD-10-CM | POA: Diagnosis not present

## 2021-08-13 ENCOUNTER — Ambulatory Visit (INDEPENDENT_AMBULATORY_CARE_PROVIDER_SITE_OTHER): Payer: Medicare Other | Admitting: Internal Medicine

## 2021-08-13 ENCOUNTER — Other Ambulatory Visit: Payer: Self-pay

## 2021-08-13 ENCOUNTER — Encounter: Payer: Self-pay | Admitting: Internal Medicine

## 2021-08-13 VITALS — BP 132/68 | HR 85 | Temp 99.1°F | Ht 70.0 in | Wt 202.2 lb

## 2021-08-13 DIAGNOSIS — F419 Anxiety disorder, unspecified: Secondary | ICD-10-CM | POA: Diagnosis not present

## 2021-08-13 DIAGNOSIS — E7800 Pure hypercholesterolemia, unspecified: Secondary | ICD-10-CM | POA: Diagnosis not present

## 2021-08-13 DIAGNOSIS — R739 Hyperglycemia, unspecified: Secondary | ICD-10-CM

## 2021-08-13 DIAGNOSIS — I2510 Atherosclerotic heart disease of native coronary artery without angina pectoris: Secondary | ICD-10-CM

## 2021-08-13 MED ORDER — SERTRALINE HCL 50 MG PO TABS: 50.0000 mg | ORAL_TABLET | Freq: Every day | ORAL | 3 refills | Status: DC

## 2021-08-13 NOTE — Progress Notes (Signed)
Patient ID: Russell Sampson, male   DOB: 1951-01-30, 70 y.o.   MRN: [id number]        Chief Complaint: follow up HTN, HLD and anxiety       HPI:  Russell Sampson is a 70 y.o. male here with c/o 1-2 mo worsening anxiety and worry and cannot determine why, just is.  Denies worsening depressive symptoms, suicidal ideation, or panic; Nothing seems to make better or worse.  Pt denies chest pain, increased sob or doe, wheezing, orthopnea, PND, increased LE swelling, palpitations, dizziness or syncope.   Pt denies polydipsia, polyuria, or new focal neuro s/s.   Pt denies fever, wt loss, night sweats, loss of appetite, or other constitutional symptoms.  Trying to follow lower chol diet.         Wt Readings from Last 3 Encounters:  08/13/21 202 lb 3.2 oz (91.7 kg)  07/16/21 194 lb (88 kg)  07/01/21 196 lb (88.9 kg)   BP Readings from Last 3 Encounters:  08/13/21 132/68  07/16/21 133/73  07/01/21 (!) 148/66         Past Medical History:  Diagnosis Date   Anxiety    when he was out of work   Cataract    BILATERAL-REMOVED   Chicken pox    Difficult intubation 11/10/2016   Anterior airway, decreased neck mobility   Heart murmur 12/14/2019   HLD (hyperlipidemia) 12/14/2019   Personal history of colonic polyps 10/2007   3-4 small adenomas   Prostate cancer [facility])    Past Surgical History:  Procedure Laterality Date   CLOSED REDUCTION FOREARM FRACTURE     1962   COLONOSCOPY     COLONOSCOPY W/ POLYPECTOMY  10/22/2007   3-4 adenomas, max 81mm   COLONOSCOPY WITH PROPOFOL N/A 05/30/2019   Procedure: COLONOSCOPY WITH PROPOFOL;  Surgeon: Gatha Mayer, MD;  Location: [facility] ENDOSCOPY;  Service: Endoscopy;  Laterality: N/A;   LYMPHADENECTOMY Bilateral 11/10/2016   Procedure: PELVIC LYMPHADENECTOMY;  Surgeon: Raynelle Bring, MD;  Location: [facility] ORS;  Service: Urology;  Laterality: Bilateral;   POLYPECTOMY     POLYPECTOMY  05/30/2019   Procedure: POLYPECTOMY;  Surgeon: Gatha Mayer, MD;  Location: [facility]  ENDOSCOPY;  Service: Endoscopy;;   PROSTATE BIOPSY     09/19/2016   ROBOT ASSISTED LAPAROSCOPIC RADICAL PROSTATECTOMY N/A 11/10/2016   Procedure: XI ROBOTIC ASSISTED LAPAROSCOPIC RADICAL PROSTATECTOMY LEVEL 2;  Surgeon: Raynelle Bring, MD;  Location: [facility] ORS;  Service: Urology;  Laterality: N/A;    reports that he has never smoked. He has never used smokeless tobacco. He reports that he does not drink alcohol and does not use drugs. family history includes Alzheimer's disease in his mother; Anxiety disorder in his sister; Prostate cancer in his father; Stroke in his mother. No Known Allergies Current Outpatient Medications on File Prior to Visit  Medication Sig Dispense Refill   b complex vitamins tablet Take 1 tablet by mouth daily.     Menthol, Topical Analgesic, (BENGAY EX) Apply 1 application topically daily as needed (muscle pain).     Multiple Vitamin (THERA) TABS Take 1 tablet by mouth daily.      naproxen (NAPROSYN) 500 MG tablet Take 1 tablet (500 mg total) by mouth 2 (two) times daily as needed. 180 tablet 0   No current facility-administered medications on file prior to visit.        ROS:  All others reviewed and negative.  Objective        PE:  BP  132/68 (BP Location: Right Arm, Patient Position: Sitting, Cuff Size: Large)    Pulse 85    Temp 99.1 F (37.3 C) (Oral)    Ht 5\' 10"  (1.778 m)    Wt 202 lb 3.2 oz (91.7 kg)    SpO2 98%    BMI 29.01 kg/m                 Constitutional: Pt appears in NAD               HENT: Head: NCAT.                Right Ear: External ear normal.                 Left Ear: External ear normal.                Eyes: . Pupils are equal, round, and reactive to light. Conjunctivae and EOM are normal               Nose: without d/c or deformity               Neck: Neck supple. Gross normal ROM               Cardiovascular: Normal rate and regular rhythm.                 Pulmonary/Chest: Effort normal and breath sounds without rales or wheezing.                 Abd:  Soft, NT, ND, + BS, no organomegaly               Neurological: Pt is alert. At baseline orientation, motor grossly intact               Skin: Skin is warm. No rashes, no other new lesions, LE edema - none               Psychiatric: Pt behavior is normal without agitation , mod nervous  Micro: none  Cardiac tracings I have personally interpreted today:  none  Pertinent Radiological findings (summarize): none   Lab Results  Component Value Date   WBC 6.0 12/17/2020   HGB 13.9 12/17/2020   HCT 41.6 12/17/2020   PLT 261.0 12/17/2020   GLUCOSE 87 12/17/2020   CHOL 191 12/17/2020   TRIG 75.0 12/17/2020   HDL 45.50 12/17/2020   LDLCALC 130 (H) 12/17/2020   ALT 19 12/17/2020   AST 17 12/17/2020   NA 140 12/17/2020   K 4.1 12/17/2020   CL 104 12/17/2020   CREATININE 0.78 12/17/2020   BUN 15 12/17/2020   CO2 27 12/17/2020   TSH 3.14 12/17/2020   PSA 0.02 (L) 12/17/2020   HGBA1C 6.1 12/17/2020   Assessment/Plan:  Russell Sampson is a 70 y.o. White or Caucasian [1] male with  has a past medical history of Anxiety, Cataract, Chicken pox, Difficult intubation (11/10/2016), Heart murmur (12/14/2019), HLD (hyperlipidemia) (12/14/2019), Personal history of colonic polyps (10/2007), and Prostate cancer ([facility]).  Anxiety Moderate uncontrolled, no panic attacks it seems, but to start zoloft 50 qd, and refer counseling  HLD (hyperlipidemia) Lab Results  Component Value Date   LDLCALC 130 (H) 12/17/2020   Uncontrolled, , pt to consider statin   Hyperglycemia Lab Results  Component Value Date   HGBA1C 6.1 12/17/2020   Stable, pt to continue current medical treatment  - diet  Followup: Return in about 6 months (around  02/11/2022).  [name], MD 08/17/2021 7:31 PM [facility]

## 2021-08-13 NOTE — Patient Instructions (Signed)
Please take all new medication as prescribed - the zoloft 50 mg per day  We can consider increase to 100 mg in 1 month if no stomach upset and you need further anxiety improvement  You will be contacted regarding the referral for: counseling  Please continue all other medications as before, and refills have been done if requested.  Please have the pharmacy call with any other refills you may need.  Please continue your efforts at being more active, low cholesterol diet, and weight control.  Please keep your appointments with your specialists as you may have planned  Please make an Appointment to return in 6 months, or sooner if needed

## 2021-08-17 ENCOUNTER — Encounter: Payer: Self-pay | Admitting: Internal Medicine

## 2021-08-17 NOTE — Assessment & Plan Note (Signed)
Lab Results  Component Value Date   HGBA1C 6.1 12/17/2020   Stable, pt to continue current medical treatment  - diet

## 2021-08-17 NOTE — Assessment & Plan Note (Signed)
Lab Results  Component Value Date   LDLCALC 130 (H) 12/17/2020   Uncontrolled, , pt to consider statin

## 2021-08-17 NOTE — Assessment & Plan Note (Signed)
Moderate uncontrolled, no panic attacks it seems, but to start zoloft 50 qd, and refer counseling

## 2021-09-11 DIAGNOSIS — C61 Malignant neoplasm of prostate: Secondary | ICD-10-CM | POA: Diagnosis not present

## 2021-09-18 ENCOUNTER — Ambulatory Visit: Payer: Medicare Other | Admitting: Psychologist

## 2021-09-18 DIAGNOSIS — C61 Malignant neoplasm of prostate: Secondary | ICD-10-CM | POA: Diagnosis not present

## 2021-10-01 ENCOUNTER — Ambulatory Visit: Payer: Medicare Other | Admitting: Psychologist

## 2021-10-03 ENCOUNTER — Other Ambulatory Visit: Payer: Self-pay

## 2021-10-03 ENCOUNTER — Ambulatory Visit (INDEPENDENT_AMBULATORY_CARE_PROVIDER_SITE_OTHER): Payer: Medicare Other | Admitting: Psychologist

## 2021-10-03 DIAGNOSIS — F411 Generalized anxiety disorder: Secondary | ICD-10-CM

## 2021-10-03 DIAGNOSIS — Z20822 Contact with and (suspected) exposure to covid-19: Secondary | ICD-10-CM | POA: Diagnosis not present

## 2021-10-03 NOTE — Plan of Care (Signed)

## 2021-10-03 NOTE — Progress Notes (Signed)
[facility] Counselor Initial Adult Exam  Name: Russell Sampson Date: 10/03/2021 MRN: [id number] DOB: May 22, 1951 PCP: Biagio Borg, MD  Time spent: 1:00 pm to 1:40 pm; total time: 40 minutes  This session was held via in person. The patient consented to in-person therapy and was in the clinician's office. Limits of confidentiality were discussed with the patient.   Guardian/Payee:  NA    Paperwork requested: No   Reason for Visit /Presenting Problem: Anxiety  Mental Status Exam: Appearance:   Well Groomed     Behavior:  Appropriate  Motor:  Normal  Speech/Language:   Clear and Coherent  Affect:  Appropriate  Mood:  normal  Thought process:  normal  Thought content:    WNL  Sensory/Perceptual disturbances:    WNL  Orientation:  oriented to person, place, and time/date  Attention:  Good  Concentration:  Good  Memory:  WNL  Fund of knowledge:   Good  Insight:    Fair  Judgment:   Good  Impulse Control:  Good    Reported Symptoms:  The patient endorsed experiencing the following: racing thoughts, feeling on edge, difficulty controlling worries, feeling restless, feeling overwhelmed, heart palpitations, and shortness of breath. He denied suicidal and homicidal ideation.   Risk Assessment: Danger to Self:  No Self-injurious Behavior: No Danger to Others: No Duty to Warn:no Physical Aggression / Violence:No  Access to Firearms a concern: No  Gang Involvement:No  Patient / guardian was educated about steps to take if suicide or homicide risk level increases between visits: n/a While future psychiatric events cannot be accurately predicted, the patient does not currently require acute inpatient psychiatric care and does not currently meet [facility] involuntary commitment criteria.  Substance Abuse History: Current substance abuse: No     Past Psychiatric History:   Previous psychological history is significant for anxiety Outpatient  Providers:NA History of Psych Hospitalization: No  Psychological Testing:  NA    Abuse History:  Victim of: No.,  NA    Report needed: No. Victim of Neglect:No. Perpetrator of  NA   Witness / Exposure to Domestic Violence: No   Protective Services Involvement: No  Witness to [employer] Violence:  No   Family History:  Family History  Problem Relation Age of Onset   Prostate cancer Father    Alzheimer's disease Mother    Stroke Mother    Anxiety disorder Sister    Colon cancer Neg Hx    Colon polyps Neg Hx    Esophageal cancer Neg Hx    Rectal cancer Neg Hx    Stomach cancer Neg Hx     Living situation: the patient lives with their spouse  Sexual Orientation: Straight  Relationship Status: married  Name of spouse / other:[name] If a parent, number of children / ages:NA  Support Systems: spouse friends  Museum/gallery curator Stress:  No   Income/Employment/Disability: Actor: No   Educational History: Education: college graduate  Religion/Sprituality/World View: Patient identified as Engineer, manufacturing.   Any cultural differences that may affect / interfere with treatment:  not applicable   Recreation/Hobbies: Working and doing different activities with old cars.   Stressors: Other: Watching the news amongst other stressors.     Strengths: Supportive Relationships  Barriers:  NA   Legal History: Pending legal issue / charges: The patient has no significant history of legal issues. History of legal issue / charges:  NA  Medical History/Surgical History: reviewed Past Medical History:  Diagnosis Date   Anxiety    when he was out of work   Cataract    BILATERAL-REMOVED   Chicken pox    Difficult intubation 11/10/2016   Anterior airway, decreased neck mobility   Heart murmur 12/14/2019   HLD (hyperlipidemia) 12/14/2019   Personal history of colonic polyps 10/2007   3-4 small adenomas   Prostate cancer [facility])     Past Surgical  History:  Procedure Laterality Date   CLOSED REDUCTION FOREARM FRACTURE     1962   COLONOSCOPY     COLONOSCOPY W/ POLYPECTOMY  10/22/2007   3-4 adenomas, max 41mm   COLONOSCOPY WITH PROPOFOL N/A 05/30/2019   Procedure: COLONOSCOPY WITH PROPOFOL;  Surgeon: Gatha Mayer, MD;  Location: [facility];  Service: Endoscopy;  Laterality: N/A;   LYMPHADENECTOMY Bilateral 11/10/2016   Procedure: PELVIC LYMPHADENECTOMY;  Surgeon: Raynelle Bring, MD;  Location: [facility];  Service: Urology;  Laterality: Bilateral;   POLYPECTOMY     POLYPECTOMY  05/30/2019   Procedure: POLYPECTOMY;  Surgeon: Gatha Mayer, MD;  Location: [facility];  Service: Endoscopy;;   PROSTATE BIOPSY     09/19/2016   ROBOT ASSISTED LAPAROSCOPIC RADICAL PROSTATECTOMY N/A 11/10/2016   Procedure: XI ROBOTIC ASSISTED LAPAROSCOPIC RADICAL PROSTATECTOMY LEVEL 2;  Surgeon: Raynelle Bring, MD;  Location: [facility];  Service: Urology;  Laterality: N/A;    Medications: Current Outpatient Medications  Medication Sig Dispense Refill   b complex vitamins tablet Take 1 tablet by mouth daily.     Menthol, Topical Analgesic, (BENGAY EX) Apply 1 application topically daily as needed (muscle pain).     Multiple Vitamin (THERA) TABS Take 1 tablet by mouth daily.      naproxen (NAPROSYN) 500 MG tablet Take 1 tablet (500 mg total) by mouth 2 (two) times daily as needed. 180 tablet 0   sertraline (ZOLOFT) 50 MG tablet Take 1 tablet (50 mg total) by mouth daily. 90 tablet 3   No current facility-administered medications for this visit.    No Known Allergies  Diagnoses:  F41.1 generalized anxiety disorder  Plan of Care: The patient is a 71 year old Caucasian male who was referred due to experiencing anxiety symptoms. The patient lives with his wife. The patient meets criteria for a diagnosis of F41.1 generalized anxiety disorder based off of the following:  racing thoughts, feeling on edge, difficulty controlling worries, feeling restless, feeling  overwhelmed, heart palpitations, and shortness of breath. He denied suicidal and homicidal ideation.   The patient stated that he want coping skills and to process emotions.   This psychologist makes the recommendation that the patient participate in at least meeting monthly for counseling. Depending on severity, possibly bi-weekly.  Conception Chancy, PsyD

## 2021-10-03 NOTE — Progress Notes (Signed)
                Finnlee Guarnieri, PsyD 

## 2021-10-10 ENCOUNTER — Other Ambulatory Visit: Payer: Self-pay

## 2021-10-10 ENCOUNTER — Ambulatory Visit (INDEPENDENT_AMBULATORY_CARE_PROVIDER_SITE_OTHER): Payer: Medicare Other | Admitting: Psychologist

## 2021-10-10 DIAGNOSIS — F411 Generalized anxiety disorder: Secondary | ICD-10-CM

## 2021-10-10 NOTE — Progress Notes (Signed)
[facility] Counselor/Therapist Progress Note  Patient ID: Russell Sampson, MRN: [id number],    Date: 10/10/2021  Time Spent: 8:07 am to 8:45 am; total time: 38 minutes   This session was held via in person. The patient consented to in-person therapy and was in the clinician's office. Limits of confidentiality were discussed with the patient.   Treatment Type: Individual Therapy  Reported Symptoms: Anxiety  Mental Status Exam: Appearance:  Well Groomed     Behavior: Appropriate  Motor: Normal  Speech/Language:  Clear and Coherent  Affect: Appropriate  Mood: normal  Thought process: normal  Thought content:   WNL  Sensory/Perceptual disturbances:   WNL  Orientation: oriented to person, place, and time/date  Attention: Good  Concentration: Good  Memory: WNL  Fund of knowledge:  Good  Insight:   Fair  Judgment:  Good  Impulse Control: Good   Risk Assessment: Danger to Self:  No Self-injurious Behavior: No Danger to Others: No Duty to Warn:no Physical Aggression / Violence:No  Access to Firearms a concern: No  Gang Involvement:No   Subjective: Beginning the session, the patient reviewed the treatment plan. After discussing the treatment plan patient disclosed that he and his wife had to put their cat down last week. From there, he stated that he wanted to identify different ways to spend his time other than watching the news. At the end of the session, patient participated in a mindfulness exercise and described it as very helpful. He was agreeable to homework and following up. He denied suicidal and homicidal ideation.    Interventions:  Worked on developing a therapeutic relationship with the patient using active listening and reflective statements. Provided emotional support using empathy and validation. Reviewed the treatment plan with the patient. Reviewed events since the intake. Normalized and validated expressed thoughts and emotions. Processed thoughts  related to the grief he was experiencing related to the death of his cat. Used socratic questions to assist the patient gain insight into self. Provided psychoeducation about being connected and how it increases rates of anxiety and depression. Explored alternative options that patient could spend time other than watching the news. Assisted in problem solving. Briefly introduced mindfulness. Practiced and processed mindfulness. Assigned homework. Assessed for suicidal and homicidal ideation.   Homework: Decrease watching the news; watch documentaries more; attempt to implement mindfulness  Next Session: Review homework. Practice mindfulness, defusion, guided imagery, and PMR. Emotional support  Diagnosis: F41.1 generalized anxiety disorder  Plan:   Client Abilities: Friendly and easy to develop rapport  Client Preferences: ACT and CBT  Client statement of Needs: Coping skills  Treatment Level: Outpatient   Goals Reduce overall frequency, intensity, and duration of anxiety Stabilize anxiety level wile increasing ability to function Enhance ability to effectively cope with full variety of stressors Learn and implement coping skills that result in a reduction of anxiety   Objectives target date for all objectives is 10/03/2022 Verbalize an understanding of the cognitive, physiological, and behavioral components of anxiety Learning and implement calming skills to reduce overall anxiety Verbalize an understanding of the role that cognitive biases play in excessive irrational worry and persistent anxiety symptoms Identify, challenge, and replace based fearful talk Learn and implement problem solving strategies Identify and engage in pleasant activities Learning and implement personal and interpersonal skills to reduce anxiety and improve interpersonal relationships Learn to accept limitations in life and commit to tolerating, rather than avoiding, unpleasant emotions while accomplishing  meaningful goals Identify major life conflicts from the  past and present that form the basis for present anxiety Maintain involvement in work, family, and social activities Reestablish a consistent sleep-wake cycle Cooperate with a medical evaluation  Interventions Engage the patient in behavioral activation Use instruction, modeling, and role-playing to build the client's general social, communication, and/or conflict resolution skills Use Acceptance and Commitment Therapy to help client accept uncomfortable realities in order to accomplish value-consistent goals Reinforce the client's insight into the role of his/her past emotional pain and present anxiety  Support the client in following through with work, family, and social activities Teach and implement sleep hygiene practices  Refer the patient to a physician for a psychotropic medication consultation Monito the clint's psychotropic medication compliance Discuss how anxiety typically involves excessive worry, various bodily expressions of tension, and avoidance of what is threatening that interact to maintain the problem  Teach the patient relaxation skills Assign the patient homework Discuss examples demonstrating that unrealistic worry overestimates the probability of threats and underestimates patient's ability  Assist the patient in analyzing his or her worries Help patient understand that avoidance is reinforcing   The patient and clinician reviewed the treatment plan on 10/10/2021. The patient approved of the treatment plan.    Conception Chancy, PsyD

## 2021-10-10 NOTE — Progress Notes (Signed)
                Leanora Murin, PsyD 

## 2021-10-25 ENCOUNTER — Other Ambulatory Visit: Payer: Self-pay

## 2021-10-25 ENCOUNTER — Ambulatory Visit (INDEPENDENT_AMBULATORY_CARE_PROVIDER_SITE_OTHER): Payer: Medicare Other | Admitting: Psychologist

## 2021-10-25 DIAGNOSIS — F411 Generalized anxiety disorder: Secondary | ICD-10-CM | POA: Diagnosis not present

## 2021-10-25 NOTE — Progress Notes (Signed)
[facility] [facility] Counselor/Therapist Progress Note ? ?Patient ID: Russell Sampson, MRN: [id number],   ? ?Date: 10/25/2021 ? ?Time Spent: 11:07 to 11:45 pm; total time: 38 minutes ? ? This session was held via in person. The patient consented to in-person therapy and was in the clinician's office. Limits of confidentiality were discussed with the patient.  ? ?Treatment Type: Individual Therapy ? ?Reported Symptoms: Anxiety ? ?Mental Status Exam: ?Appearance:  Well Groomed     ?Behavior: Appropriate  ?Motor: Normal  ?Speech/Language:  Clear and Coherent  ?Affect: Appropriate  ?Mood: normal  ?Thought process: normal  ?Thought content:   WNL  ?Sensory/Perceptual disturbances:   WNL  ?Orientation: oriented to person, place, and time/date  ?Attention: Good  ?Concentration: Good  ?Memory: WNL  ?Fund of knowledge:  Good  ?Insight:   Fair  ?Judgment:  Good  ?Impulse Control: Good  ? ?Risk Assessment: ?Danger to Self:  No ?Self-injurious Behavior: No ?Danger to Others: No ?Duty to Warn:no ?Physical Aggression / Violence:No  ?Access to Firearms a concern: No  ?Gang Involvement:No  ? ?Subjective: Beginning the session, the patient described himself as doing well. He disclosed that his mother in-law died on 2022/11/06. He briefly processed this. From there, he processed other events. He acknowledged that having others to socialize with would be beneficial. He was agreeable to homework and following up. He denied suicidal and homicidal ideation.   ? ?Interventions:  Worked on developing a therapeutic relationship with the patient using active listening and reflective statements. Provided emotional support using empathy and validation. Used summary statements. Praised the patient for doing well. Reflected on what has assisted the patient. Processed the events since the last session. Reviewed homework. Used socratic questions to assist the patient gain insight into self. Identified the theme of socialization. Explored how  patient could get socialization needs met. Assisted in problem solving. Assigned homework. Assessed for suicidal and homicidal ideation.  ? ?Homework: Contact other men to develop social groups to get social needs met ? ?Next Session: Review homework. Coping skills, and emotional support ? ?Diagnosis: F41.1 generalized anxiety disorder ? ?Plan:  ? ?Client Abilities: Friendly and easy to develop rapport ? ?Client Preferences: ACT and CBT ? ?Client statement of Needs: Coping skills ? ?Treatment Level: Outpatient  ? ?Goals ?Reduce overall frequency, intensity, and duration of anxiety ?Stabilize anxiety level wile increasing ability to function ?Enhance ability to effectively cope with full variety of stressors ?Learn and implement coping skills that result in a reduction of anxiety  ? ?Objectives target date for all objectives is 10/03/2022 ?Verbalize an understanding of the cognitive, physiological, and behavioral components of anxiety ?Learning and implement calming skills to reduce overall anxiety ?Verbalize an understanding of the role that cognitive biases play in excessive irrational worry and persistent anxiety symptoms ?Identify, challenge, and replace based fearful talk ?Learn and implement problem solving strategies ?Identify and engage in pleasant activities ?Learning and implement personal and interpersonal skills to reduce anxiety and improve interpersonal relationships ?Learn to accept limitations in life and commit to tolerating, rather than avoiding, unpleasant emotions while accomplishing meaningful goals ?Identify major life conflicts from the past and present that form the basis for present anxiety ?Maintain involvement in work, family, and social activities ?Reestablish a consistent sleep-wake cycle ?Cooperate with a medical evaluation  ?Interventions ?Engage the patient in behavioral activation ?Use instruction, modeling, and role-playing to build the client's general social, communication, and/or  conflict resolution skills ?Use Acceptance and Commitment Therapy to help client accept uncomfortable  realities in order to accomplish value-consistent goals ?Reinforce the client's insight into the role of his/her past emotional pain and present anxiety  ?Support the client in following through with work, family, and social activities ?Teach and implement sleep hygiene practices  ?Refer the patient to a physician for a psychotropic medication consultation ?Monito the clint's psychotropic medication compliance ?Discuss how anxiety typically involves excessive worry, various bodily expressions of tension, and avoidance of what is threatening that interact to maintain the problem  ?Teach the patient relaxation skills ?Assign the patient homework ?Discuss examples demonstrating that unrealistic worry overestimates the probability of threats and underestimates patient's ability  ?Assist the patient in analyzing his or her worries ?Help patient understand that avoidance is reinforcing  ? ?The patient and clinician reviewed the treatment plan on 10/10/2021. The patient approved of the treatment plan.  ? ? ?Conception Chancy, PsyD ? ?

## 2021-10-31 DIAGNOSIS — B353 Tinea pedis: Secondary | ICD-10-CM | POA: Diagnosis not present

## 2021-10-31 DIAGNOSIS — D2372 Other benign neoplasm of skin of left lower limb, including hip: Secondary | ICD-10-CM | POA: Diagnosis not present

## 2021-10-31 DIAGNOSIS — L814 Other melanin hyperpigmentation: Secondary | ICD-10-CM | POA: Diagnosis not present

## 2021-10-31 DIAGNOSIS — L821 Other seborrheic keratosis: Secondary | ICD-10-CM | POA: Diagnosis not present

## 2021-10-31 DIAGNOSIS — L2084 Intrinsic (allergic) eczema: Secondary | ICD-10-CM | POA: Diagnosis not present

## 2021-10-31 DIAGNOSIS — D225 Melanocytic nevi of trunk: Secondary | ICD-10-CM | POA: Diagnosis not present

## 2021-10-31 DIAGNOSIS — L309 Dermatitis, unspecified: Secondary | ICD-10-CM | POA: Diagnosis not present

## 2021-10-31 DIAGNOSIS — L918 Other hypertrophic disorders of the skin: Secondary | ICD-10-CM | POA: Diagnosis not present

## 2021-10-31 DIAGNOSIS — L570 Actinic keratosis: Secondary | ICD-10-CM | POA: Diagnosis not present

## 2021-11-01 ENCOUNTER — Telehealth: Payer: Self-pay | Admitting: Internal Medicine

## 2021-11-01 MED ORDER — SERTRALINE HCL 50 MG PO TABS: 100.0000 mg | ORAL_TABLET | Freq: Every day | ORAL | 3 refills | Status: DC

## 2021-11-01 NOTE — Telephone Encounter (Signed)
Pts spouse states sertraline (ZOLOFT) 50 MG tablet  is not working for pt ? ?Spouse inquiring if dosage can be increased ? ? ?Pharmacy:  [facility] at ?[place] ? ? ?

## 2021-11-01 NOTE — Telephone Encounter (Signed)
Increase dose to 100 mg daily -- if this is not effective after 3 weeks or so I would recommend a f/u visit with dr Jenny Reichmann ? ? ?Med list updated ?

## 2021-11-01 NOTE — Telephone Encounter (Signed)
Patient notified

## 2021-11-20 ENCOUNTER — Ambulatory Visit (INDEPENDENT_AMBULATORY_CARE_PROVIDER_SITE_OTHER): Payer: Medicare Other | Admitting: Psychologist

## 2021-11-20 DIAGNOSIS — F411 Generalized anxiety disorder: Secondary | ICD-10-CM

## 2021-11-20 NOTE — Progress Notes (Signed)
[facility] Counselor/Therapist Progress Note ? ?Patient ID: Russell Sampson, MRN: [id number],   ? ?Date: 11/20/2021 ? ?Time Spent: 10:05 am to 10:43 am; total time: 38 minutes ? ? This session was held via in person. The patient consented to in-person therapy and was in the clinician's office. Limits of confidentiality were discussed with the patient.  ? ?Treatment Type: Individual Therapy ? ?Reported Symptoms: Anxiety ? ?Mental Status Exam: ?Appearance:  Well Groomed     ?Behavior: Appropriate  ?Motor: Normal  ?Speech/Language:  Clear and Coherent  ?Affect: Appropriate  ?Mood: normal  ?Thought process: normal  ?Thought content:   WNL  ?Sensory/Perceptual disturbances:   WNL  ?Orientation: oriented to person, place, and time/date  ?Attention: Good  ?Concentration: Good  ?Memory: WNL  ?Fund of knowledge:  Good  ?Insight:   Fair  ?Judgment:  Good  ?Impulse Control: Good  ? ?Risk Assessment: ?Danger to Self:  No ?Self-injurious Behavior: No ?Danger to Others: No ?Duty to Warn:no ?Physical Aggression / Violence:No  ?Access to Firearms a concern: No  ?Gang Involvement:No  ? ?Subjective: Beginning the session, the patient described himself as doing well indicating that mindfulness has assisted him. Patient participated in defusion and guided imagery describing both resources as helpful. He was agreeable to homework and following up. He denied suicidal and homicidal ideation.   ? ?Interventions:  Worked on developing a therapeutic relationship with the patient using active listening and reflective statements. Provided emotional support using empathy and validation. Praised patient for doing well and explored what has assisted the patient. Provided psychoeducation about defusion and guided imagery. Practiced and processed defusion and guided imagery. Praised patient for experiencing less distress. Assisted in problem solving. Assigned homework. Assessed for suicidal and homicidal ideation.  ? ?Homework:  Implement defusion and guided imagery ? ?Next Session: Review homework. PMR and worry box ? ?Diagnosis: F41.1 generalized anxiety disorder ? ?Plan:  ? ?Client Abilities: Friendly and easy to develop rapport ? ?Client Preferences: ACT and CBT ? ?Client statement of Needs: Coping skills ? ?Treatment Level: Outpatient  ? ?Goals ?Reduce overall frequency, intensity, and duration of anxiety ?Stabilize anxiety level wile increasing ability to function ?Enhance ability to effectively cope with full variety of stressors ?Learn and implement coping skills that result in a reduction of anxiety  ? ?Objectives target date for all objectives is 10/03/2022 ?Verbalize an understanding of the cognitive, physiological, and behavioral components of anxiety ?Learning and implement calming skills to reduce overall anxiety ?Verbalize an understanding of the role that cognitive biases play in excessive irrational worry and persistent anxiety symptoms ?Identify, challenge, and replace based fearful talk ?Learn and implement problem solving strategies ?Identify and engage in pleasant activities ?Learning and implement personal and interpersonal skills to reduce anxiety and improve interpersonal relationships ?Learn to accept limitations in life and commit to tolerating, rather than avoiding, unpleasant emotions while accomplishing meaningful goals ?Identify major life conflicts from the past and present that form the basis for present anxiety ?Maintain involvement in work, family, and social activities ?Reestablish a consistent sleep-wake cycle ?Cooperate with a medical evaluation  ?Interventions ?Engage the patient in behavioral activation ?Use instruction, modeling, and role-playing to build the client's general social, communication, and/or conflict resolution skills ?Use Acceptance and Commitment Therapy to help client accept uncomfortable realities in order to accomplish value-consistent goals ?Reinforce the client's insight into the  role of his/her past emotional pain and present anxiety  ?Support the client in following through with work, family, and social activities ?Teach  and implement sleep hygiene practices  ?Refer the patient to a physician for a psychotropic medication consultation ?Monito the clint's psychotropic medication compliance ?Discuss how anxiety typically involves excessive worry, various bodily expressions of tension, and avoidance of what is threatening that interact to maintain the problem  ?Teach the patient relaxation skills ?Assign the patient homework ?Discuss examples demonstrating that unrealistic worry overestimates the probability of threats and underestimates patient's ability  ?Assist the patient in analyzing his or her worries ?Help patient understand that avoidance is reinforcing  ? ?The patient and clinician reviewed the treatment plan on 10/10/2021. The patient approved of the treatment plan.  ? ? ?Conception Chancy, PsyD ? ? ? ? ? ? ? ? ? ? ? ? ? ? ? ?Conception Chancy, PsyD ?

## 2021-11-26 ENCOUNTER — Telehealth: Payer: Self-pay

## 2021-11-26 NOTE — Telephone Encounter (Signed)
Pt wife is calling requesting a increase in dosage of sertraline (ZOLOFT) 50 MG tablet.  ? ?Upon listening to pt wife he has only been taking 1 per day. I advised her that the Rx that was sent in on 3/17 was written to take 2 tab ('100mg'$ ) daily. ? ?She states they will try that and call back if needed. ? ?FYI ?

## 2021-12-09 DIAGNOSIS — Z20822 Contact with and (suspected) exposure to covid-19: Secondary | ICD-10-CM | POA: Diagnosis not present

## 2021-12-25 DIAGNOSIS — Z20822 Contact with and (suspected) exposure to covid-19: Secondary | ICD-10-CM | POA: Diagnosis not present

## 2021-12-30 ENCOUNTER — Ambulatory Visit (INDEPENDENT_AMBULATORY_CARE_PROVIDER_SITE_OTHER): Payer: Medicare Other | Admitting: Internal Medicine

## 2021-12-30 ENCOUNTER — Encounter: Payer: Self-pay | Admitting: Internal Medicine

## 2021-12-30 VITALS — BP 122/60 | HR 70 | Temp 98.7°F | Ht 70.0 in | Wt 188.0 lb

## 2021-12-30 DIAGNOSIS — C61 Malignant neoplasm of prostate: Secondary | ICD-10-CM

## 2021-12-30 DIAGNOSIS — F419 Anxiety disorder, unspecified: Secondary | ICD-10-CM

## 2021-12-30 DIAGNOSIS — E538 Deficiency of other specified B group vitamins: Secondary | ICD-10-CM | POA: Diagnosis not present

## 2021-12-30 DIAGNOSIS — E559 Vitamin D deficiency, unspecified: Secondary | ICD-10-CM

## 2021-12-30 DIAGNOSIS — I2510 Atherosclerotic heart disease of native coronary artery without angina pectoris: Secondary | ICD-10-CM

## 2021-12-30 DIAGNOSIS — H900 Conductive hearing loss, bilateral: Secondary | ICD-10-CM | POA: Diagnosis not present

## 2021-12-30 DIAGNOSIS — H6123 Impacted cerumen, bilateral: Secondary | ICD-10-CM | POA: Diagnosis not present

## 2021-12-30 DIAGNOSIS — R739 Hyperglycemia, unspecified: Secondary | ICD-10-CM | POA: Diagnosis not present

## 2021-12-30 DIAGNOSIS — E7800 Pure hypercholesterolemia, unspecified: Secondary | ICD-10-CM | POA: Diagnosis not present

## 2021-12-30 LAB — LIPID PANEL
Cholesterol: 160 mg/dL (ref 0–200)
HDL: 41.7 mg/dL (ref >39.00)
LDL Cholesterol: 99 mg/dL (ref 0–99)
NonHDL: 118.07
Total CHOL/HDL Ratio: 4
Triglycerides: 97 mg/dL (ref 0.0–149.0)
VLDL: 19.4 mg/dL (ref 0.0–40.0)

## 2021-12-30 LAB — HEMOGLOBIN A1C: Hgb A1c MFr Bld: 5.8 % (ref 4.6–6.5)

## 2021-12-30 LAB — VITAMIN B12: Vitamin B-12: 385 pg/mL (ref 211–911)

## 2021-12-30 LAB — BASIC METABOLIC PANEL
BUN: 13 mg/dL (ref 6–23)
CO2: 28 mEq/L (ref 19–32)
Calcium: 8.8 mg/dL (ref 8.4–10.5)
Chloride: 105 mEq/L (ref 96–112)
Creatinine, Ser: 0.7 mg/dL (ref 0.40–1.50)
GFR: 94.46 mL/min (ref >60.00)
Glucose, Bld: 82 mg/dL (ref 70–99)
Potassium: 3.8 mEq/L (ref 3.5–5.1)
Sodium: 140 mEq/L (ref 135–145)

## 2021-12-30 LAB — HEPATIC FUNCTION PANEL
ALT: 14 U/L (ref 0–53)
AST: 14 U/L (ref 0–37)
Albumin: 3.9 g/dL (ref 3.5–5.2)
Alkaline Phosphatase: 87 U/L (ref 39–117)
Bilirubin, Direct: 0.1 mg/dL (ref 0.0–0.3)
Total Bilirubin: 0.5 mg/dL (ref 0.2–1.2)
Total Protein: 6.2 g/dL (ref 6.0–8.3)

## 2021-12-30 LAB — CBC WITH DIFFERENTIAL/PLATELET
Basophils Absolute: 0.1 10*3/uL (ref 0.0–0.1)
Basophils Relative: 0.8 % (ref 0.0–3.0)
Eosinophils Absolute: 0.6 10*3/uL (ref 0.0–0.7)
Eosinophils Relative: 8 % — ABNORMAL HIGH (ref 0.0–5.0)
HCT: 39.5 % (ref 39.0–52.0)
Hemoglobin: 13.1 g/dL (ref 13.0–17.0)
Lymphocytes Relative: 25.8 % (ref 12.0–46.0)
Lymphs Abs: 1.8 10*3/uL (ref 0.7–4.0)
MCHC: 33.3 g/dL (ref 30.0–36.0)
MCV: 83.8 fl (ref 78.0–100.0)
Monocytes Absolute: 0.6 10*3/uL (ref 0.1–1.0)
Monocytes Relative: 8.2 % (ref 3.0–12.0)
Neutro Abs: 4.1 10*3/uL (ref 1.4–7.7)
Neutrophils Relative %: 57.2 % (ref 43.0–77.0)
Platelets: 245 10*3/uL (ref 150.0–400.0)
RBC: 4.72 Mil/uL (ref 4.22–5.81)
RDW: 13.8 % (ref 11.5–15.5)
WBC: 7.1 10*3/uL (ref 4.0–10.5)

## 2021-12-30 LAB — URINALYSIS, ROUTINE W REFLEX MICROSCOPIC
Bilirubin Urine: NEGATIVE
Hgb urine dipstick: NEGATIVE
Ketones, ur: NEGATIVE
Leukocytes,Ua: NEGATIVE
Nitrite: NEGATIVE
Specific Gravity, Urine: >=1.03 — AB (ref 1.000–1.030)
Total Protein, Urine: NEGATIVE
Urine Glucose: NEGATIVE
Urobilinogen, UA: 0.2 (ref 0.0–1.0)
pH: 5.5 (ref 5.0–8.0)

## 2021-12-30 LAB — PSA: PSA: 0 ng/mL — ABNORMAL LOW (ref 0.10–4.00)

## 2021-12-30 LAB — TSH: TSH: 2.81 u[IU]/mL (ref 0.35–5.50)

## 2021-12-30 LAB — VITAMIN D 25 HYDROXY (VIT D DEFICIENCY, FRACTURES): VITD: 31.48 ng/mL (ref 30.00–100.00)

## 2021-12-30 MED ORDER — ROSUVASTATIN CALCIUM 10 MG PO TABS: 10.0000 mg | ORAL_TABLET | Freq: Every day | ORAL | 3 refills | Status: DC

## 2021-12-30 MED ORDER — SERTRALINE HCL 50 MG PO TABS: 100.0000 mg | ORAL_TABLET | Freq: Every day | ORAL | 3 refills | Status: DC

## 2021-12-30 NOTE — Assessment & Plan Note (Signed)
Also for psa with labs ?

## 2021-12-30 NOTE — Progress Notes (Signed)
Patient ID: Russell Sampson, male   DOB: 03-Sep-1950, 71 y.o.   MRN: [id number] ? ? ? ?     Chief Complaint:: yearly exam ? ?     HPI:  Russell Sampson is a 71 y.o. male overall doing ok,  Denies worsening depressive symptoms, suicidal ideation, or panic; has ongoing anxiety,declines need for change in tx today  Pt denies chest pain, increased sob or doe, wheezing, orthopnea, PND, increased LE swelling, palpitations, dizziness or syncope.   Pt denies polydipsia, polyuria, or new focal neuro s/s.    Pt denies fever, wt loss, night sweats, loss of appetite, or other constitutional symptoms, except Wt down several lbs with better diet.  Has bilateral ear reduced hearing for 2 wks, has to turn up the TV and wife complains.  No ear pain, HA, discharge, fever, ST, tinnitus or vertigo  Denies urinary symptoms such as dysuria, frequency, urgency, flank pain, hematuria or n/v, fever, chills. ?Wt Readings from Last 3 Encounters:  ?12/30/21 188 lb (85.3 kg)  ?08/13/21 202 lb 3.2 oz (91.7 kg)  ?07/16/21 194 lb (88 kg)  ? ?BP Readings from Last 3 Encounters:  ?12/30/21 122/60  ?08/13/21 132/68  ?07/16/21 133/73  ? ?Immunization History  ?Administered Date(s) Administered  ? Fluad Quad(high Dose 65+) 07/01/2021  ? Influenza Split 06/05/2012  ? Influenza, High Dose Seasonal PF 04/05/2019  ? Influenza,inj,Quad PF,6+ Mos 04/16/2015, 05/12/2017  ? Influenza-Unspecified 03/26/2016, 04/18/2018  ? Moderna Sars-Covid-2 Vaccination 10/13/2019, 11/11/2019  ? PFIZER(Purple Top)SARS-COV-2 Vaccination 08/01/2020  ? Pneumococcal Conjugate-13 07/04/2018  ? Pneumococcal Polysaccharide-23 06/18/2018  ? Tdap 08/03/2017  ? Zoster Recombinat (Shingrix) 07/10/2017, 08/24/2017  ? Zoster, Live 04/16/2015  ? ?There are no preventive care reminders to display for this patient. ? ?  ? ?Past Medical History:  ?Diagnosis Date  ? Anxiety   ? when he was out of work  ? Cataract   ? BILATERAL-REMOVED  ? Chicken pox   ? Difficult intubation 11/10/2016  ? Anterior  airway, decreased neck mobility  ? Heart murmur 12/14/2019  ? HLD (hyperlipidemia) 12/14/2019  ? Personal history of colonic polyps 10/2007  ? 3-4 small adenomas  ? Prostate cancer ([place])   ? ?Past Surgical History:  ?Procedure Laterality Date  ? CLOSED REDUCTION FOREARM FRACTURE    ? 1962  ? COLONOSCOPY    ? COLONOSCOPY W/ POLYPECTOMY  10/22/2007  ? 3-4 adenomas, max 23m  ? COLONOSCOPY WITH PROPOFOL N/A 05/30/2019  ? Procedure: COLONOSCOPY WITH PROPOFOL;  Surgeon: GGatha Mayer MD;  [facility]: [facility];  Service: Endoscopy;  Laterality: N/A;  ? LYMPHADENECTOMY Bilateral 11/10/2016  ? Procedure: PELVIC LYMPHADENECTOMY;  Surgeon: LRaynelle Bring MD;  [facility];  Service: Urology;  Laterality: Bilateral;  ? POLYPECTOMY    ? POLYPECTOMY  05/30/2019  ? Procedure: POLYPECTOMY;  Surgeon: GGatha Mayer MD;  [facility];  Service: Endoscopy;;  ? PROSTATE BIOPSY    ? 09/19/2016  ? ROBOT ASSISTED LAPAROSCOPIC RADICAL PROSTATECTOMY N/A 11/10/2016  ? Procedure: XI ROBOTIC ASSISTED LAPAROSCOPIC RADICAL PROSTATECTOMY LEVEL 2;  Surgeon: LRaynelle Bring MD;  [facility];  Service: Urology;  Laterality: N/A;  ? ? reports that he has never smoked. He has never used smokeless tobacco. He reports that he does not drink alcohol and does not use drugs. ?family history includes Alzheimer's disease in his mother; Anxiety disorder in his sister; Prostate cancer in his father; Stroke in his mother. ?No Known Allergies ?Current Outpatient Medications on File Prior to Visit  ?  Medication Sig Dispense Refill  ? Ascorbic Acid (VITAMIN C) 100 MG tablet Take 100 mg by mouth daily.    ? b complex vitamins tablet Take 1 tablet by mouth daily.    ? Menthol, Topical Analgesic, (BENGAY EX) Apply 1 application topically daily as needed (muscle pain).    ? naproxen (NAPROSYN) 500 MG tablet Take 1 tablet (500 mg total) by mouth 2 (two) times daily as needed. 180 tablet 0  ? Multiple Vitamin (THERA) TABS Take 1 tablet by mouth daily.   (Patient not taking: Reported on 12/30/2021)    ? ?No current facility-administered medications on file prior to visit.  ? ?     ROS:  All others reviewed and negative. ? ?Objective  ? ?     PE:  BP 122/60 (BP Location: Right Arm, Patient Position: Sitting, Cuff Size: Large)   Pulse 70   Temp 98.7 ?F (37.1 ?C) (Oral)   Ht '5\' 10"'$  (1.778 m)   Wt 188 lb (85.3 kg)   SpO2 98%   BMI 26.98 kg/m?  ? ?              Constitutional: Pt appears in NAD ?              HENT: Head: NCAT.  ?              Right Ear: External ear normal.   ?              Left Ear: External ear normal.  Bilateral wax impactions resolved with irrigation and hearing improved ?              Eyes: . Pupils are equal, round, and reactive to light. Conjunctivae and EOM are normal ?              Nose: without d/c or deformity ?              Neck: Neck supple. Gross normal ROM ?              Cardiovascular: Normal rate and regular rhythm.   ?              Pulmonary/Chest: Effort normal and breath sounds without rales or wheezing.  ?              Abd:  Soft, NT, ND, + BS, no organomegaly ?              Neurological: Pt is alert. At baseline orientation, motor grossly intact ?              Skin: Skin is warm. No rashes, no other new lesions, LE edema - none ?              Psychiatric: Pt behavior is normal without agitation  ? ?Micro: none ? ?Cardiac tracings I have personally interpreted today:  none ? ?Pertinent Radiological findings (summarize): none  ? ?Lab Results  ?Component Value Date  ? WBC 7.1 12/30/2021  ? HGB 13.1 12/30/2021  ? HCT 39.5 12/30/2021  ? PLT 245.0 12/30/2021  ? GLUCOSE 82 12/30/2021  ? CHOL 160 12/30/2021  ? TRIG 97.0 12/30/2021  ? HDL 41.70 12/30/2021  ? [place] 99 12/30/2021  ? ALT 14 12/30/2021  ? AST 14 12/30/2021  ? NA 140 12/30/2021  ? K 3.8 12/30/2021  ? CL 105 12/30/2021  ? CREATININE 0.70 12/30/2021  ? BUN 13 12/30/2021  ? CO2 28 12/30/2021  ? TSH 2.81 12/30/2021  ?  PSA 0.00 (L) 12/30/2021  ? HGBA1C 5.8 12/30/2021   ? ?Assessment/Plan:  ?Russell Sampson is a 71 y.o. White or Caucasian [1] male with  has a past medical history of Anxiety, Cataract, Chicken pox, Difficult intubation (11/10/2016), Heart murmur (12/14/2019), HLD (hyperlipidemia) (12/14/2019), Personal history of colonic polyps (10/2007), and Prostate cancer ([place]). ? ?Prostate cancer ([place]) ?Also for psa with labs ? ?Anxiety ?Stable overall, cont same tx zoloft ? ?HLD (hyperlipidemia) ?Lab Results  ?Component Value Date  ? [place] 99 12/30/2021  ? ?Uncontrolled, for start crestor 10 qd, also check cardiac CT score ? ? ?Hyperglycemia ?Lab Results  ?Component Value Date  ? HGBA1C 5.8 12/30/2021  ? ?Stable, pt to continue current medical treatment - diet ? ? ?Bilateral hearing loss ?Improved with irrigation wax impactions ? ?Ceruminosis is noted.  Wax is removed by syringing and manual debridement. Instructions for home care to prevent wax buildup are given. ? ?Followup: Return in about 6 months (around 07/02/2022). ? ?[name], MD 12/30/2021 9:24 PM ?[facility] ?

## 2021-12-30 NOTE — Assessment & Plan Note (Signed)
Stable overall, cont same tx zoloft ?

## 2021-12-30 NOTE — Assessment & Plan Note (Signed)
Improved with irrigation wax impactions ? ?Ceruminosis is noted.  Wax is removed by syringing and manual debridement. Instructions for home care to prevent wax buildup are given. ? ?

## 2021-12-30 NOTE — Patient Instructions (Signed)
Please take all new medication as prescribed - the crestor for high cholesterol ? ?We have discussed the Cardiac CT Score test to measure the calcification level (if any) in your heart arteries.  This test has been ordered in our [facility], so please call [facility] directly, as they prefer this, at [phone] to be scheduled. ? ?Please continue all other medications as before, and refills have been done if requested. ? ?Please have the pharmacy call with any other refills you may need. ? ?Please continue your efforts at being more active, low cholesterol diet, and weight control. ? ?You are otherwise up to date with prevention measures today. ? ?Please keep your appointments with your specialists as you may have planned ? ?Please go to the LAB at the blood drawing area for the tests to be done ? ?You will be contacted by phone if any changes need to be made immediately.  Otherwise, you will receive a letter about your results with an explanation, but please check with MyChart first. ? ?Please remember to sign up for MyChart if you have not done so, as this will be important to you in the future with finding out test results, communicating by private email, and scheduling acute appointments online when needed. ? ?Please make an Appointment to return in 6 months, or sooner if needed ?

## 2021-12-30 NOTE — Assessment & Plan Note (Signed)
Lab Results  ?Component Value Date  ? HGBA1C 5.8 12/30/2021  ? ?Stable, pt to continue current medical treatment - diet ? ?

## 2021-12-30 NOTE — Progress Notes (Signed)
PRE-PROCEDURE EXAM: Bilateral TM cannot be visualized due to total occlusion/impaction of the ear canal. ? ?PROCEDURE INDICATION: remove wax to visualize ear drum & relieve discomfort ? ?CONSENT:  Verbal ? ?  ? ?PROCEDURE NOTE: ? ?  ? ?BILATERAL EARS:  I used a plastic wax curette under direct vision with an otoscope to free the wax bolus from the ear wall and then successfully removed a small bit of wax. The ear was then irrigated with warm water and peroxide to remove the remaining wax. ? ?POST- PROCEDURE EXAM: TMs successfully visualized and found to have no erythema ? ?  ? ?The patient tolerated the procedure well.  ? ?

## 2021-12-30 NOTE — Assessment & Plan Note (Signed)
Lab Results  ?Component Value Date  ? [place] 99 12/30/2021  ? ?Uncontrolled, for start crestor 10 qd, also check cardiac CT score ? ?

## 2022-01-06 ENCOUNTER — Ambulatory Visit: Payer: Medicare Other | Admitting: Psychologist

## 2022-01-16 ENCOUNTER — Other Ambulatory Visit: Payer: Self-pay | Admitting: Internal Medicine

## 2022-01-16 DIAGNOSIS — E7800 Pure hypercholesterolemia, unspecified: Secondary | ICD-10-CM

## 2022-02-07 ENCOUNTER — Ambulatory Visit (HOSPITAL_BASED_OUTPATIENT_CLINIC_OR_DEPARTMENT_OTHER)
Admission: RE | Admit: 2022-02-07 | Discharge: 2022-02-07 | Disposition: A | Discharge status: Home / Self Care | Payer: No Typology Code available for payment source | Source: Ambulatory Visit | Attending: Internal Medicine | Admitting: Internal Medicine

## 2022-02-07 DIAGNOSIS — E7800 Pure hypercholesterolemia, unspecified: Secondary | ICD-10-CM

## 2022-02-13 ENCOUNTER — Encounter: Payer: Self-pay | Admitting: Internal Medicine

## 2022-02-13 ENCOUNTER — Ambulatory Visit (INDEPENDENT_AMBULATORY_CARE_PROVIDER_SITE_OTHER): Payer: Medicare Other | Admitting: Internal Medicine

## 2022-02-13 VITALS — BP 138/76 | HR 74 | Temp 98.3°F | Ht 70.0 in | Wt 191.4 lb

## 2022-02-13 DIAGNOSIS — R931 Abnormal findings on diagnostic imaging of heart and coronary circulation: Secondary | ICD-10-CM | POA: Diagnosis not present

## 2022-02-13 DIAGNOSIS — E559 Vitamin D deficiency, unspecified: Secondary | ICD-10-CM | POA: Diagnosis not present

## 2022-02-13 DIAGNOSIS — E7800 Pure hypercholesterolemia, unspecified: Secondary | ICD-10-CM

## 2022-02-13 DIAGNOSIS — F419 Anxiety disorder, unspecified: Secondary | ICD-10-CM | POA: Diagnosis not present

## 2022-02-13 DIAGNOSIS — R739 Hyperglycemia, unspecified: Secondary | ICD-10-CM

## 2022-02-13 HISTORY — DX: Vitamin D deficiency, unspecified: E55.9

## 2022-02-13 NOTE — Patient Instructions (Signed)
We will need to work on the final report for the Cardiac CT Score which has yet to be done  Please continue all other medications as before, and refills have been done if requested.  Please have the pharmacy call with any other refills you may need.  Please continue your efforts at being more active, low cholesterol diet, and weight control.  Please keep your appointments with your specialists as you may have planned  Please make an Appointment to return in 6 months, or sooner if needed

## 2022-02-13 NOTE — Progress Notes (Signed)
Patient ID: Russell Sampson, male   DOB: [birth year], 71 y.o.   MRN: [id number]        Chief Complaint: follow up hld, f/u cardiac CT score, low vit d       HPI:  Russell Sampson is a 71 y.o. male here to f/u finals CT cardiac Score but for some reason this is not available on epic yet.  Not taking Vit d.  Trying to follow lower chol diet.  Pt denies chest pain, increased sob or doe, wheezing, orthopnea, PND, increased LE swelling, palpitations, dizziness or syncope.   Pt denies polydipsia, polyuria, or new focal neuro s/s.    Pt denies fever, wt loss, night sweats, loss of appetite, or other constitutional symptoms  Denies worsening depressive symptoms, suicidal ideation, or panic       Wt Readings from Last 3 Encounters:  02/13/22 191 lb 6 oz (86.8 kg)  12/30/21 188 lb (85.3 kg)  08/13/21 202 lb 3.2 oz (91.7 kg)   BP Readings from Last 3 Encounters:  02/13/22 138/76  12/30/21 122/60  08/13/21 132/68         Past Medical History:  Diagnosis Date   Anxiety    when he was out of work   Cataract    BILATERAL-REMOVED   Chicken pox    Difficult intubation 11/10/2016   Anterior airway, decreased neck mobility   Heart murmur 12/14/2019   HLD (hyperlipidemia) 12/14/2019   Personal history of colonic polyps 10/2007   3-4 small adenomas   Prostate cancer [facility])    Past Surgical History:  Procedure Laterality Date   CLOSED REDUCTION FOREARM FRACTURE     1962   COLONOSCOPY     COLONOSCOPY W/ POLYPECTOMY  10/22/2007   3-4 adenomas, max 69m   COLONOSCOPY WITH PROPOFOL N/A 05/30/2019   Procedure: COLONOSCOPY WITH PROPOFOL;  Surgeon: GGatha Mayer MD;  [facility];  Service: Endoscopy;  Laterality: N/A;   LYMPHADENECTOMY Bilateral 11/10/2016   Procedure: PELVIC LYMPHADENECTOMY;  Surgeon: LRaynelle Bring MD;  [facility];  Service: Urology;  Laterality: Bilateral;   POLYPECTOMY     POLYPECTOMY  05/30/2019   Procedure: POLYPECTOMY;  Surgeon: GGatha Mayer MD;  [facility];  Service: Endoscopy;;   PROSTATE BIOPSY     09/19/2016   ROBOT ASSISTED LAPAROSCOPIC RADICAL PROSTATECTOMY N/A 11/10/2016   Procedure: XI ROBOTIC ASSISTED LAPAROSCOPIC RADICAL PROSTATECTOMY LEVEL 2;  Surgeon: LRaynelle Bring MD;  [facility];  Service: Urology;  Laterality: N/A;    reports that he has never smoked. He has never used smokeless tobacco. He reports that he does not drink alcohol and does not use drugs. family history includes Alzheimer's disease in his mother; Anxiety disorder in his sister; Prostate cancer in his father; Stroke in his mother. No Known Allergies Current Outpatient Medications on File Prior to Visit  Medication Sig Dispense Refill   Ascorbic Acid (VITAMIN C) 100 MG tablet Take 100 mg by mouth daily.     b complex vitamins tablet Take 1 tablet by mouth daily.     Menthol, Topical Analgesic, (BENGAY EX) Apply 1 application topically daily as needed (muscle pain).     Multiple Vitamin (THERA) TABS Take 1 tablet by mouth daily.     naproxen (NAPROSYN) 500 MG tablet Take 1 tablet (500 mg total) by mouth 2 (two) times daily as needed. 180 tablet 0   sertraline (ZOLOFT) 50 MG tablet Take 2 tablets (100 mg total) by mouth daily.  180 tablet 3   No current facility-administered medications on file prior to visit.        ROS:  All others reviewed and negative.  Objective        PE:  BP 138/76   Pulse 74   Temp 98.3 F (36.8 C) (Oral)   Ht '5\' 10"'$  (1.778 m)   Wt 191 lb 6 oz (86.8 kg)   SpO2 98%   BMI 27.46 kg/m                 Constitutional: Pt appears in NAD               HENT: Head: NCAT.                Right Ear: External ear normal.                 Left Ear: External ear normal.                Eyes: . Pupils are equal, round, and reactive to light. Conjunctivae and EOM are normal               Nose: without d/c or deformity               Neck: Neck supple. Gross normal ROM               Cardiovascular: Normal rate and regular rhythm.                  Pulmonary/Chest: Effort normal and breath sounds without rales or wheezing.                Abd:  Soft, NT, ND, + BS, no organomegaly               Neurological: Pt is alert. At baseline orientation, motor grossly intact               Skin: Skin is warm. No rashes, no other new lesions, LE edema - none               Psychiatric: Pt behavior is normal without agitation , mild nervous  Micro: none  Cardiac tracings I have personally interpreted today:  none  Pertinent Radiological findings (summarize): none   Lab Results  Component Value Date   WBC 7.1 12/30/2021   HGB 13.1 12/30/2021   HCT 39.5 12/30/2021   PLT 245.0 12/30/2021   GLUCOSE 82 12/30/2021   CHOL 160 12/30/2021   TRIG 97.0 12/30/2021   HDL 41.70 12/30/2021   LDLCALC 99 12/30/2021   ALT 14 12/30/2021   AST 14 12/30/2021   NA 140 12/30/2021   K 3.8 12/30/2021   CL 105 12/30/2021   CREATININE 0.70 12/30/2021   BUN 13 12/30/2021   CO2 28 12/30/2021   TSH 2.81 12/30/2021   PSA 0.00 (L) 12/30/2021   HGBA1C 5.8 12/30/2021   Assessment/Plan:  Russell Sampson is a 71 y.o. White or Caucasian [1] male with  has a past medical history of Anxiety, Cataract, Chicken pox, Difficult intubation (11/10/2016), Heart murmur (12/14/2019), HLD (hyperlipidemia) (12/14/2019), Personal history of colonic polyps (10/2007), and Prostate cancer ([place]).  Vitamin D deficiency Last vitamin D Lab Results  Component Value Date   VD25OH 31.48 12/30/2021   Low, to start oral replacement   Hyperglycemia Lab Results  Component Value Date   HGBA1C 5.8 12/30/2021   Stable, pt to continue current medical treatment  - diet, wt  control, excercise   HLD (hyperlipidemia) Lab Results  Component Value Date   LDLCALC 99 12/30/2021   Stable, pt to continue current statin - crestor 10 mg qd, though we will need to f/u on final Card Ct Score, and if abnormal will need to consider increased crestor with goal ldl < 70   Anxiety Overall  stable, cont currrent med tx including zolfot 100 mg qd, declines need for change in tx or referral for counseling  Followup: Return in about 6 months (around 08/15/2022).  [name], MD 02/16/2022 1:16 PM [facility]

## 2022-02-14 ENCOUNTER — Other Ambulatory Visit: Payer: Self-pay | Admitting: Internal Medicine

## 2022-02-14 DIAGNOSIS — R931 Abnormal findings on diagnostic imaging of heart and coronary circulation: Secondary | ICD-10-CM

## 2022-02-14 MED ORDER — ROSUVASTATIN CALCIUM 20 MG PO TABS: 20.0000 mg | ORAL_TABLET | Freq: Every day | ORAL | 3 refills | Status: DC

## 2022-02-14 MED ORDER — ASPIRIN 81 MG PO TBEC: 81.0000 mg | DELAYED_RELEASE_TABLET | Freq: Every day | ORAL | 12 refills | Status: DC

## 2022-02-16 ENCOUNTER — Encounter: Payer: Self-pay | Admitting: Internal Medicine

## 2022-02-16 NOTE — Assessment & Plan Note (Signed)
Overall stable, cont currrent med tx including zolfot 100 mg qd, declines need for change in tx or referral for counseling

## 2022-02-16 NOTE — Assessment & Plan Note (Signed)
Lab Results  Component Value Date   HGBA1C 5.8 12/30/2021   Stable, pt to continue current medical treatment  - diet, wt control, excercise

## 2022-02-16 NOTE — Assessment & Plan Note (Signed)
Lab Results  Component Value Date   LDLCALC 99 12/30/2021   Stable, pt to continue current statin - crestor 10 mg qd, though we will need to f/u on final Card Ct Score, and if abnormal will need to consider increased crestor with goal ldl < 70

## 2022-02-16 NOTE — Assessment & Plan Note (Signed)
Last vitamin D Lab Results  Component Value Date   VD25OH 31.48 12/30/2021   Low, to start oral replacement' 

## 2022-02-24 ENCOUNTER — Ambulatory Visit: Payer: Medicare Other | Admitting: Psychology

## 2022-02-24 ENCOUNTER — Encounter: Payer: Self-pay | Admitting: Psychology

## 2022-02-24 ENCOUNTER — Ambulatory Visit (INDEPENDENT_AMBULATORY_CARE_PROVIDER_SITE_OTHER): Payer: Medicare Other | Admitting: Psychology

## 2022-02-24 DIAGNOSIS — F410 Panic disorder [episodic paroxysmal anxiety] without agoraphobia: Secondary | ICD-10-CM | POA: Diagnosis not present

## 2022-02-24 DIAGNOSIS — R4189 Other symptoms and signs involving cognitive functions and awareness: Secondary | ICD-10-CM

## 2022-02-24 DIAGNOSIS — F411 Generalized anxiety disorder: Secondary | ICD-10-CM | POA: Diagnosis not present

## 2022-02-24 DIAGNOSIS — G250 Essential tremor: Secondary | ICD-10-CM | POA: Insufficient documentation

## 2022-02-24 DIAGNOSIS — G3184 Mild cognitive impairment, so stated: Secondary | ICD-10-CM | POA: Diagnosis not present

## 2022-02-24 HISTORY — DX: Mild cognitive impairment of uncertain or unknown etiology: G31.84

## 2022-02-24 NOTE — Progress Notes (Signed)
NEUROPSYCHOLOGICAL EVALUATION [place]. [place] Department of Neurology  Date of Evaluation: February 24, 2022  Reason for Referral:   Russell Sampson is a 71 y.o. right-handed Caucasian male referred by  Sharene Butters, PA-C , to characterize his current cognitive functioning and assist with diagnostic clarity and treatment planning in the context of subjective cognitive decline and a strong family history of Alzheimer's disease.   Assessment and Plan:   Clinical Impression(s): Scores across stand-alone and embedded performance validity measures were variable but largely within expectation. The raw score of his one below expectation performance was very close to appropriate clinical cut-offs. As such, the results of the current evaluation are believed to be a generally valid representation of Russell Sampson current cognitive functioning. However, the extent of acute anxiety may have influenced abilities to a mild degree.   Russell Sampson pattern of performance is suggestive of a primary impairment surrounding both encoding (i.e., learning) and retrieval aspects of verbal memory. A relative weakness was also exhibited across phonemic fluency, while performance variability was exhibited across processing speed, executive functioning, and recognition/consolidation aspects of verbal memory. Performances were appropriate relative to age-matched peers across attention/concentration, safety/judgment, receptive language, semantic fluency, confrontation naming, visuospatial abilities, and visual learning and memory. Russell Sampson denied difficulties completing instrumental activities of daily living (ADLs) independently. As such, given evidence for cognitive dysfunction described above, he meets criteria for a Mild Neurocognitive Disorder ("mild cognitive impairment") at the present time.  The etiology of ongoing dysfunction is unclear at the present time and certainly may be have been influenced  by significant anxiety experienced during the testing session. While he only scored in the mild range across an anxiety questionnaire, this questionnaire may be too broad to capture specific anxieties, especially surrounding cognitive decline and future dementia concerns. Significant anxiety can certainly create some variability across cognitive domains, especially processing speed, executive functioning, and encoding/retrieval aspects of memory.   Outside of these influences, it is important to address concerns held by he and his wife surrounding Alzheimer's disease, especially given his strong family history. Across three verbal memory measures, he did demonstrate a weakness learning new information efficiently. Across list and daily living tasks, he was fully amnestic (i.e., 0% retention) after a brief delay and performances were variable across yes/Sampson recognition trials. Across a third story task, his delayed retrieval was also impaired; however, he was able to recall 67% of previously learned information. Taken together, this could suggest very early evidence for rapid forgetting and an evolving storage impairment, which are hallmark characteristics of this illness. Put more plainly, there is the potential that day-to-day instances of Russell Sampson repeating himself and asking repetitive questions, as well as performance across testing, is due to something above and beyond day-to-day anxiety. This will need to be closely monitored over time. It is important to highlight that visual memory was appropriate and non-memory areas commonly affected early on in Alzheimer's disease (i.e., semantic fluency, confrontation naming, visuospatial abilities) remained strong.   Outside of Alzheimer's disease, there is some research to suggest that a history of essential tremor can be associated with mild cognitive dysfunction. There is Sampson mention of members of his medical team expressing concerns for Parkinson's disease. While  there could theoretically be a vascular contribution, recent imaging suggested very mild microvascular ischemic changes, suggesting that test performances are not due to these changes alone. He does not display behavioral or cognitive patterns concerning for frontotemporal lobar degeneration, Lewy body disease, or  other more rare parkinsonian conditions.  Recommendations: A repeat neuropsychological evaluation in 12-18 months is recommended. Generally speaking, chronic anxiety would not be expected to yield progressive decline. As such, the trajectory of future cognitive decline, should it occur, will allow for greater diagnostic clarity.  If desired, Russell Sampson could discuss medications aimed to address memory loss with Ms. Wertman. It is important to highlight that these medications have been shown to slow functional decline in some individuals. There is Sampson current treatment which can stop or reverse cognitive decline when caused by a neurodegenerative illness.   Given ongoing significant anxiety, Russell Sampson is encouraged to speak with his prescribing physician regarding medication adjustments to optimally manage these symptoms.   While Russell Sampson is engaged in outpatient psychotherapy, he unfortunately did not describe a strong therapeutic alliance with his current therapist. Per his wife, Russell Sampson has described to her that he never gets to speak during sessions, that his therapist continually pushes mindfulness exercises, and seems to talk down to him. I would recommend that Russell Sampson discuss his experience directly with his therapist, as well as his personal goals, to see if adjustments to their relationship and therapeutic process can be made. If not, I would recommend that he explore meeting with a new provider.   Russell Sampson is encouraged to attend to lifestyle factors for brain health (e.g., regular physical exercise, good nutrition habits, regular participation in cognitively-stimulating activities,  and general stress management techniques), which are likely to have benefits for both emotional adjustment and cognition. In fact, in addition to promoting good general health, regular exercise incorporating aerobic activities (e.g., brisk walking, jogging, cycling, etc.) has been demonstrated to be a very effective treatment for depression and stress, with similar efficacy rates to both antidepressant medication and psychotherapy. Optimal control of vascular risk factors (including safe cardiovascular exercise and adherence to dietary recommendations) is encouraged. Continued participation in activities which provide mental stimulation and social interaction is also recommended.   Memory can be improved using internal strategies such as rehearsal, repetition, chunking, mnemonics, association, and imagery. External strategies such as written notes in a consistently used memory journal, visual and nonverbal auditory cues such as a calendar on the refrigerator or appointments with alarm, such as on a cell phone, can also help maximize recall.    Because he shows better recall for structured information, he will likely understand and retain new information better if it is presented to him in a meaningful or well-organized manner at the outset, such as grouping items into meaningful categories or presenting information in an outlined, bulleted, or story format.   To address problems with processing speed, he may wish to consider:   -Ensuring that he is alerted when essential material or instructions are being presented   -Adjusting the speed at which new information is presented  -Allowing for more time in comprehending, processing, and responding in conversation  To address problems with executive dysfunction, he may wish to consider:   -Avoiding external distractions when needing to concentrate   -Limiting exposure to fast paced environments with multiple sensory demands   -Writing down complicated  information and using checklists   -Attempting and completing one task at a time (i.e., Sampson multi-tasking)   -Verbalizing aloud each step of a task to maintain focus  -Reducing the amount of information considered at one time  Review of Records:   Russell Sampson was seen by [facility] Marland KitchenEllouise Newer, M.D.) on 02/08/2019 for an evaluation of cognitive concern.  At that time, he did not report strong memory decline. His wife noticed that during the past several years, he would repeat himself. His greatest concern surrounded a strong family history of suspected Alzheimer's disease, including an early-onset presentation in his younger brother. He and his wife noted that this has greatly increased anxiety, to the point that he will "totally freak out about it" when he may misplace something around his residence. Generalized anxiety is a longstanding condition. ADLs were described as intact. He denied headaches, dizziness, vision changes, dysarthria/dysphagia, neck/back pain, focal numbness/tingling/weakness, bowel/bladder dysfunction, or anosmia. He denied tremors; however, his wife reminded him of occasional right hand shaking, previously diagnosed as benign essential tremor. It does not affect writing or using utensils. Performance on a brief cognitive screening instrument (MOCA) was 26/30.  He was seen by [facility] Sharene Butters, PA-C) for follow-up on 07/16/2021. At that time, he felt that his memory had remained about the same. ADLs continued to be intact. Performance on a brief cognitive screening instrument (MOCA) was reportedly 28/30. Ultimately, Russell Sampson was referred for a comprehensive neuropsychological evaluation to characterize his cognitive abilities and to assist with diagnostic clarity and treatment planning.   Brain MRI on 09/03/2018 revealed age-appropriate cerebral volume with extremely mild microvascular ischemic changes. Brain MRI on 08/05/2021 was said to be stable.   Past Medical  History:  Diagnosis Date   Arthritis of finger of left hand 07/08/2021   Benign essential tremor    Right-hand   Benign neoplasm of ascending colon    Benign neoplasm of cecum    Benign neoplasm of sigmoid colon    Benign neoplasm of transverse colon    Cataract    bilateral; removed   Chicken pox    Difficult airway for intubation 03/10/2019   Elbow pain 08/29/2013   Generalized anxiety disorder with panic attacks 01/30/2011   Greater trochanteric bursitis of right hip 03/21/2020   Heart murmur 12/14/2019   HLD (hyperlipidemia) 12/14/2019   Hyperglycemia 12/15/2020   Knee effusion, right 04/11/2020   Personal history of colonic polyps 10/2007   3-4 small adenomas   Prostate cancer    Right lumbar radiculopathy 03/28/2020   Thumb tendonitis 07/21/2020   Vitamin D deficiency 02/13/2022    Past Surgical History:  Procedure Laterality Date   CLOSED REDUCTION FOREARM FRACTURE     1962   COLONOSCOPY     COLONOSCOPY W/ POLYPECTOMY  10/22/2007   3-4 adenomas, max 29m   COLONOSCOPY WITH PROPOFOL N/A 05/30/2019   Procedure: COLONOSCOPY WITH PROPOFOL;  Surgeon: GGatha Mayer MD;  Location: [facility];  Service: Endoscopy;  Laterality: N/A;   LYMPHADENECTOMY Bilateral 11/10/2016   Procedure: PELVIC LYMPHADENECTOMY;  Surgeon: LRaynelle Bring MD;  Location: [facility];  Service: Urology;  Laterality: Bilateral;   POLYPECTOMY     POLYPECTOMY  05/30/2019   Procedure: POLYPECTOMY;  Surgeon: GGatha Mayer MD;  Location: [facility];  Service: Endoscopy;;   PROSTATE BIOPSY     09/19/2016   ROBOT ASSISTED LAPAROSCOPIC RADICAL PROSTATECTOMY N/A 11/10/2016   Procedure: XI ROBOTIC ASSISTED LAPAROSCOPIC RADICAL PROSTATECTOMY LEVEL 2;  Surgeon: LRaynelle Bring MD;  Location: [facility];  Service: Urology;  Laterality: N/A;    Current Outpatient Medications:    Ascorbic Acid (VITAMIN C) 100 MG tablet, Take 100 mg by mouth daily., Disp: , Rfl:    aspirin EC 81 MG tablet, Take 1 tablet (81 mg total)  by mouth daily. Swallow whole., Disp: 30 tablet, Rfl: 12  b complex vitamins tablet, Take 1 tablet by mouth daily., Disp: , Rfl:    Menthol, Topical Analgesic, (BENGAY EX), Apply 1 application topically daily as needed (muscle pain)., Disp: , Rfl:    Multiple Vitamin (THERA) TABS, Take 1 tablet by mouth daily., Disp: , Rfl:    naproxen (NAPROSYN) 500 MG tablet, Take 1 tablet (500 mg total) by mouth 2 (two) times daily as needed., Disp: 180 tablet, Rfl: 0   rosuvastatin (CRESTOR) 20 MG tablet, Take 1 tablet (20 mg total) by mouth daily., Disp: 90 tablet, Rfl: 3   sertraline (ZOLOFT) 50 MG tablet, Take 2 tablets (100 mg total) by mouth daily., Disp: 180 tablet, Rfl: 3  Clinical Interview:   The following information was obtained during a clinical interview with Russell Sampson and his wife prior to cognitive testing.  Cognitive Symptoms: Decreased short-term memory: Denied. His wife did report that he will repeat himself or ask repetitive questions while in conversation. She attributed this to significant anxiety rather than neurological decline.  Decreased long-term memory: Denied. Decreased attention/concentration: Denied. Reduced processing speed: Denied. Difficulties with executive functions: Denied. They also denied trouble with impulsivity or any significant personality changes.  Difficulties with emotion regulation: Denied. Difficulties with receptive language: Denied. Difficulties with word finding: Denied. Decreased visuoperceptual ability: Denied.  Difficulties completing ADLs: Denied.  Additional Medical History: History of traumatic brain injury/concussion: Denied. History of stroke: Denied. History of seizure activity: Denied. History of known exposure to toxins: Denied. Symptoms of chronic pain: Denied. Experience of frequent headaches/migraines: Denied. Frequent instances of dizziness/vertigo: Denied.  Sensory changes: Denied. He underwent bilateral cataract surgery in the  past to improve his vision.  Balance/coordination difficulties: Denied. They also denied any recent falls.  Other motor difficulties: Endorsed. He does have a history of some very mild shakiness in his right hand, previously diagnosed as benign essential tremor.  Sleep History: Estimated hours obtained each night: 6-8 hours.  Difficulties falling asleep: Denied. Difficulties staying asleep: Denied. Feels rested and refreshed upon awakening: Endorsed.  History of snoring: Endorsed. History of waking up gasping for air: Denied. Witnessed breath cessation while asleep: Denied.  History of vivid dreaming: Denied. Excessive movement while asleep: Denied. Instances of acting out his dreams: Denied.  Psychiatric/Behavioral Health History: Depression: He described his current mood as "not too bad" and did not report significant depressive symptoms. They did describe some stressors surrounding the passing of a family pet and his mother-in-law during the past 6-7 months. Current or remote suicidal ideation, intent, or plan was denied.  Anxiety: Endorsed. Fairly significant generalized anxiety is longstanding in nature. These symptoms have been elevated given his family history of suspected Alzheimer's disease, especially his younger brother's early-onset presentation. His wife noted that he will panic whenever he misplaces or loses something. However, this has been improved since his PCP prescribed Zoloft. He has a current counselor to help address ongoing anxiety. However, he did not describe a strong therapeutic alliance with this individual, stating that he feels talked down to and never gets to speak himself during sessions. As such, therapy has not been very effective thus far.  Mania: Denied. Trauma History: Denied. Visual/auditory hallucinations: Denied. Delusional thoughts: Denied.  Tobacco: Denied. Alcohol: He denied current alcohol consumption as well as a history of problematic alcohol  abuse or dependence.  Recreational drugs: Denied.  Family History: Problem Relation Age of Onset   Alzheimer's disease Mother    Stroke Mother    Prostate cancer Father    Anxiety  disorder Sister    Alzheimer's disease Brother 40       early-onset   Alzheimer's disease Other        Maternal aunt's daughter   Alzheimer's disease Maternal Aunt    Colon cancer Neg Hx    Colon polyps Neg Hx    Esophageal cancer Neg Hx    Rectal cancer Neg Hx    Stomach cancer Neg Hx    This information was confirmed by Russell Sampson.  Academic/Vocational History: Highest level of educational attainment: 16 years. He graduated from high school and earned a Dietitian in music education from [employer]. He described himself as an average (mostly B) student in academic settings. Math was noted as a likely relative weakness.   History of developmental delay: Denied. History of grade repetition: Denied. Enrollment in special education courses: Denied. History of LD/ADHD: Denied.  Employment: Retired. 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   Evaluation Results:   Behavioral Observations: Russell Sampson was accompanied by his wife, arrived to his appointment on time, and was appropriately dressed and groomed. He appeared alert and oriented. Observed gait and station were within normal limits. Gross motor functioning appeared intact upon informal observation and Sampson abnormal movements (e.g., tremors) were noted. He appeared very anxious during interview, often commenting on the significance of his nerves. Spontaneous speech was fluent. Mild word finding difficulties were observed. He was also fairly repetitive during interview, especially when discussing his family neurological history, as well as when describing dissatisfaction surrounding his academic experiences. Thought  processes were coherent, organized, and normal in content. Insight into his cognitive difficulties appeared somewhat limited in that he generally denied all cognitive concerns during interview despite objective cognitive testing revealing impairments.   During testing, anxiety was quite apparent. His speech was rapid at times and he repeatedly apologized for being nervous. As tasks were introduced, he would commonly state "oh boy, oh boy, oh boy" in a particularly anxious manner. He was also noted to be repetitive, especially when describing his family neurological history, with the psychometrist. Sustained attention was appropriate. Task engagement was adequate and he persisted when challenged. Overall, Mr. Delillo was cooperative with the clinical interview and subsequent testing procedures.   Adequacy of Effort: The validity of neuropsychological testing is limited by the extent to which the individual being tested may be assumed to have exerted adequate effort during testing. Russell Sampson expressed his intention to perform to the best of his abilities and exhibited adequate task engagement and persistence. Scores across stand-alone and embedded performance validity measures were variable but largely within expectation. The raw score of his only below expectation performance was very close to appropriate clinical cut-offs. As such, the results of the current evaluation are believed to be a generally valid representation of Russell Sampson current cognitive functioning. However, the extent of acute anxiety may have influenced abilities to a mild degree.   Test Results: Mr. Cerasoli was largely oriented at the time of the current evaluation. He was unable to name the current clinic (but did accurately name the street it is located on).   Intellectual abilities based upon educational and vocational attainment were estimated to be in the average range. Premorbid abilities were estimated to be within the below average  range based upon a single-word reading test.   Processing speed was variable, ranging from the exceptionally low to average  normative ranges. Basic attention was average. More complex attention (e.g., working memory) was also average. Executive functioning was variable, ranging from the exceptionally low to average normative ranges. Performance on a task assessing safety and judgment was average.  Assessed receptive language abilities were average. Likewise, Mr. Sanzo did not exhibit any difficulties comprehending task instructions and answered all questions asked of him appropriately. Assessed expressive language was variable. Phonemic fluency was well below average to below average, semantic fluency was above average to well above average, and confrontation naming was below average.      Assessed visuospatial/visuoconstructional abilities were average. A point was lost on his drawing of a clock due to Sampson size differentiation between clock hands.     Learning (i.e., encoding) of novel verbal and visual information was exceptionally low to below average. Spontaneous delayed recall (i.e., retrieval) of previously learned information was average across a visual task but exceptionally low to well below average across all verbal tasks. Retention rates were 67% across a story learning task, 0% across a list learning task, 0% across a daily living task, and 100% across a shape learning task. Performance across recognition tasks was variable, ranging from the exceptionally low to average normative ranges, suggesting some evidence for information consolidation.   Results of emotional screening instruments suggested that recent symptoms of generalized anxiety were in the mild range, while symptoms of depression were within normal limits. A screening instrument assessing recent sleep quality suggested the presence of minimal sleep dysfunction.  Tables of Scores:   Note: This summary of test scores accompanies  the interpretive report and should not be considered in isolation without reference to the appropriate sections in the text. Descriptors are based on appropriate normative data and may be adjusted based on clinical judgment. Terms such as "Within Normal Limits" and "Outside Normal Limits" are used when a more specific description of the test score cannot be determined.       Percentile - Normative Descriptor > 98 - Exceptionally High 91-97 - Well Above Average 75-90 - Above Average 25-74 - Average 9-24 - Below Average 2-8 - Well Below Average < 2 - Exceptionally Low       Validity:   DESCRIPTOR       ACS Word Choice: --- --- Outside Normal Limits  Dot Counting Test: --- --- Within Normal Limits  NAB EVI: --- --- Within Normal Limits  D-KEFS Color Word Effort Index: --- --- Within Normal Limits       Orientation:      Raw Score Percentile   NAB Orientation, Form 1 28/29 --- ---       Cognitive Screening:      Raw Score Percentile   SLUMS: 25/30 --- ---       Intellectual Functioning:      Standard Score Percentile   Test of Premorbid Functioning: 89 23 Below Average       Memory:     NAB Memory Module, Form 1: Standard Score/ T Score Percentile   Total Memory Index 64 1 Exceptionally Low  List Learning       Total Trials 1-3 16/36 (35) 7 Well Below Average    List B 3/12 (40) 16 Below Average    Short Delay Free Recall 2/12 (23) <1 Exceptionally Low    Long Delay Free Recall 0/12 (21) <1 Exceptionally Low    Retention Percentage 0 (15) <1 Exceptionally Low    Recognition Discriminability -1 (25) 1 Exceptionally Low  Shape Learning  Total Trials 1-3 13/27 (42) 21 Below Average    Delayed Recall 5/9 (45) 31 Average    Retention Percentage 100 (51) 54 Average    Recognition Discriminability 4 (37) 9 Below Average  Story Learning       Immediate Recall 21/80 (21) <1 Exceptionally Low    Delayed Recall 8/40 (29) 2 Well Below Average    Retention Percentage 67 (39) 14  Below Average  Daily Living Memory       Immediate Recall 39/51 (42) 21 Below Average    Delayed Recall 0/17 (19) <1 Exceptionally Low    Retention Percentage 0 (-10) <1 Exceptionally Low    Recognition Hits 8/10 (44) 27 Average       Attention/Executive Function:     Trail Making Test (TMT): Raw Score (T Score) Percentile     Part A 66 secs.,  0 errors (28) 2 Well Below Average    Part B 140 secs.,  1 error (35) 7 Well Below Average         Scaled Score Percentile   WAIS-IV Coding: 9 37 Average       NAB Attention Module, Form 1: T Score Percentile     Digits Forward 49 46 Average    Digits Backwards 45 31 Average        Scaled Score Percentile   WAIS-IV Similarities: 8 25 Average       D-KEFS Color-Word Interference Test: Raw Score (Scaled Score) Percentile     Color Naming 50 secs. (3) 1 Exceptionally Low    Word Reading 21 secs. (12) 75 Above Average    Inhibition 64 secs. (11) 63 Average      Total Errors 7 errors (4) 2 Well Below Average    Inhibition/Switching 120 secs. (4) 2 Well Below Average      Total Errors 23 errors (1) <1 Exceptionally Low       D-KEFS Verbal Fluency Test: Raw Score (Scaled Score) Percentile     Letter Total Correct 21 (6) 9 Below Average    Category Total Correct 47 (15) 95 Well Above Average    Category Switching Total Correct 10 (7) 16 Below Average    Category Switching Accuracy 8 (7) 16 Below Average      Total Set Loss Errors 7 (4) 2 Well Below Average      Total Repetition Errors 11 (2) <1 Exceptionally Low       NAB Executive Functions Module, Form 1: T Score Percentile     Judgment 52 58 Average       Language:     Verbal Fluency Test: Raw Score (T Score) Percentile     Phonemic Fluency (FAS) 21 (30) 2 Well Below Average    Animal Fluency 24 (58) 79 Above Average        NAB Language Module, Form 1: T Score Percentile     Auditory Comprehension 55 69 Average    Naming 29/31 (41) 18 Below Average        Visuospatial/Visuoconstruction:      Raw Score Percentile   Clock Drawing: 9/10 --- Within Normal Limits       NAB Spatial Module, Form 1: T Score Percentile     Figure Drawing Copy 56 73 Average        Scaled Score Percentile   WAIS-IV Block Design: 8 25 Average       Mood and Personality:      Raw Score Percentile   Beck Depression Inventory -  II: 4 --- Within Normal Limits  PROMIS Anxiety Questionnaire: 17 --- Mild       Additional Questionnaires:      Raw Score Percentile   PROMIS Sleep Disturbance Questionnaire: 21 --- None to Slight   Informed Consent and Coding/Compliance:   The current evaluation represents a clinical evaluation for the purposes previously outlined by the referral source and is in Sampson way reflective of a forensic evaluation.   Mr. Commons was provided with a verbal description of the nature and purpose of the present neuropsychological evaluation. Also reviewed were the foreseeable risks and/or discomforts and benefits of the procedure, limits of confidentiality, and mandatory reporting requirements of this provider. The patient was given the opportunity to ask questions and receive answers about the evaluation. Oral consent to participate was provided by the patient.   This evaluation was conducted by Christia Reading, Ph.D., ABPP-CN, board certified clinical neuropsychologist. Mr. Frayre completed a clinical interview with Dr. Melvyn Novas, billed as one unit [phone], and 150 minutes of cognitive testing and scoring, billed as one unit [phone] and four additional units [id number]. Psychometrist Milana Kidney, B.S., assisted Dr. Melvyn Novas with test administration and scoring procedures. As a separate and discrete service, Dr. Melvyn Novas spent a total of 160 minutes in interpretation and report writing billed as one unit [phone] and two units 96133.

## 2022-02-24 NOTE — Progress Notes (Signed)
   Psychometrician Note   Cognitive testing was administered to Russell Sampson by Milana Kidney, B.S. (psychometrist) under the supervision of Dr. Christia Reading, Ph.D., licensed psychologist on 02/24/2022. Russell Sampson did not appear overtly distressed by the testing session per behavioral observation or responses across self-report questionnaires. Rest breaks were offered.    The battery of tests administered was selected by Dr. Christia Reading, Ph.D. with consideration to Russell Sampson current level of functioning, the nature of his symptoms, emotional and behavioral responses during interview, level of literacy, observed level of motivation/effort, and the nature of the referral question. This battery was communicated to the psychometrist. Communication between Dr. Christia Reading, Ph.D. and the psychometrist was ongoing throughout the evaluation and Dr. Christia Reading, Ph.D. was immediately accessible at all times. Dr. Christia Reading, Ph.D. provided supervision to the psychometrist on the date of this service to the extent necessary to assure the quality of all services provided.    Russell Sampson will return within approximately 1-2 weeks for an interactive feedback session with Dr. Melvyn Novas at which time his test performances, clinical impressions, and treatment recommendations will be reviewed in detail. Russell Sampson understands he can contact our office should he require our assistance before this time.  A total of 150 minutes of billable time were spent face-to-face with Russell Sampson by the psychometrist. This includes both test administration and scoring time. Billing for these services is reflected in the clinical report generated by Dr. Christia Reading, Ph.D.  This note reflects time spent with the psychometrician and does not include test scores or any clinical interpretations made by Dr. Melvyn Novas. The full report will follow in a separate note.

## 2022-02-28 ENCOUNTER — Ambulatory Visit (INDEPENDENT_AMBULATORY_CARE_PROVIDER_SITE_OTHER): Payer: Medicare Other | Admitting: Psychology

## 2022-02-28 DIAGNOSIS — F410 Panic disorder [episodic paroxysmal anxiety] without agoraphobia: Secondary | ICD-10-CM

## 2022-02-28 DIAGNOSIS — G3184 Mild cognitive impairment, so stated: Secondary | ICD-10-CM | POA: Diagnosis not present

## 2022-02-28 DIAGNOSIS — F411 Generalized anxiety disorder: Secondary | ICD-10-CM

## 2022-02-28 NOTE — Progress Notes (Signed)
   Neuropsychology Feedback Session Tillie Rung. [facility]  Reason for Referral:   Russell Sampson is a 71 y.o. right-handed Caucasian male referred by  Sharene Butters, PA-C , to characterize his current cognitive functioning and assist with diagnostic clarity and treatment planning in the context of subjective cognitive decline and a strong family history of Alzheimer's disease.   Feedback:   Mr. Kidd completed a comprehensive neuropsychological evaluation on 02/24/2022. Please refer to that encounter for the full report and recommendations. Briefly, results suggested a primary impairment surrounding both encoding (i.e., learning) and retrieval aspects of verbal memory. A relative weakness was also exhibited across phonemic fluency, while performance variability was exhibited across processing speed, executive functioning, and recognition/consolidation aspects of verbal memory. Across three verbal memory measures, he did demonstrate a weakness learning new information efficiently. Across list and daily living tasks, he was fully amnestic (i.e., 0% retention) after a brief delay and performances were variable across yes/no recognition trials. Across a third story task, his delayed retrieval was also impaired; however, he was able to recall 67% of previously learned information. Taken together, this could suggest very early evidence for rapid forgetting and an evolving storage impairment, which are hallmark characteristics of this illness. Put more plainly, there is the potential that day-to-day instances of Mr. Loberg repeating himself and asking repetitive questions, as well as performance across testing, is due to something above and beyond day-to-day anxiety. This will need to be closely monitored over time. It is important to highlight that visual memory was appropriate and non-memory areas commonly affected early on in Alzheimer's disease (i.e., semantic fluency,  confrontation naming, visuospatial abilities) remained strong.   Mr. Waddington was accompanied by his wife during the current feedback session. Content of the current session focused on the results of his neuropsychological evaluation. Mr. Cacioppo was given the opportunity to ask questions and his questions were answered. He was encouraged to reach out should additional questions arise. A copy of his report was provided at the conclusion of the visit.      35 minutes were spent conducting the current feedback session with Mr. Bailon, billed as one unit [phone].

## 2022-03-03 ENCOUNTER — Encounter: Payer: No Typology Code available for payment source | Admitting: Psychology

## 2022-03-04 NOTE — Progress Notes (Signed)
Cardiology Office Note:   Date:  03/06/2022  NAME:  Russell Sampson    MRN: [id number] DOB:  12-16-1950   PCP:  Biagio Borg, MD  Cardiologist:  None  Electrophysiologist:  None   Referring MD: Biagio Borg, MD   Chief Complaint  Patient presents with   Coronary Artery Disease    History of Present Illness:   Russell Sampson is a 71 y.o. male with a hx of cognitive impairment, HLD who is being seen today for the evaluation of CAD at the request of Biagio Borg, MD. recently underwent coronary calcium scoring.  Value was elevated.  No strong family history of heart disease.  No chest pain.  He does report shortness of breath when doing yard work.  Suspect this is old age-related per his report.  Denies any chest pain or chest tightness.  Was started on Crestor increased to 20 mg daily.  Needs a repeat in several weeks.  We discussed LP(a) testing.  He does have mild cognitive impairment.  No elevated blood pressure.  A1c is normal.  Most recent LDL 99.  Again denies chest pain symptoms.  He is married.  No children.  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  He presents with his wife.  His EKG is normal.  All of his blood work appears to be normal as well.  He does have a 2 out of 6 holosystolic murmur.  I have recommended an echocardiogram.  We also discussed a stress test just to make sure he has no obstructive CAD.   A1c 5.8  Problem List CAD -CAC 777 (84th percentile)  2. HLD -T chol 160, HDL 41, LDL 99, TG 97 3. Cognitive impairment  Past Medical History: Past Medical History:  Diagnosis Date   Arthritis of finger of left hand 07/08/2021   Benign essential tremor    Right-hand   Benign neoplasm of ascending colon    Benign neoplasm of cecum    Benign neoplasm of sigmoid colon    Benign neoplasm of transverse colon    Cataract    bilateral; removed   Chicken pox    Difficult airway for intubation 03/10/2019   Elbow pain 08/29/2013   Generalized anxiety disorder  with panic attacks 01/30/2011   Greater trochanteric bursitis of right hip 03/21/2020   Heart murmur 12/14/2019   HLD (hyperlipidemia) 12/14/2019   Hyperglycemia 12/15/2020   Knee effusion, right 04/11/2020   Mild cognitive impairment of uncertain or unknown etiology    Personal history of colonic polyps 10/2007   3-4 small adenomas   Prostate cancer    Right lumbar radiculopathy 03/28/2020   Thumb tendonitis 07/21/2020   Vitamin D deficiency 02/13/2022    Past Surgical History: Past Surgical History:  Procedure Laterality Date   CLOSED REDUCTION FOREARM FRACTURE     1962   COLONOSCOPY     COLONOSCOPY W/ POLYPECTOMY  10/22/2007   3-4 adenomas, max 56m   COLONOSCOPY WITH PROPOFOL N/A 05/30/2019   Procedure: COLONOSCOPY WITH PROPOFOL;  Surgeon: GGatha Mayer MD;  Location: [facility];  Service: Endoscopy;  Laterality: N/A;   LYMPHADENECTOMY Bilateral 11/10/2016   Procedure: PELVIC LYMPHADENECTOMY;  Surgeon: LRaynelle Bring MD;  Location: [facility];  Service: Urology;  Laterality: Bilateral;   POLYPECTOMY     POLYPECTOMY  05/30/2019   Procedure: POLYPECTOMY;  Surgeon: GGatha Mayer MD;  Location: [facility];  Service: Endoscopy;;   PROSTATE BIOPSY  09/19/2016   ROBOT ASSISTED LAPAROSCOPIC RADICAL PROSTATECTOMY N/A 11/10/2016   Procedure: XI ROBOTIC ASSISTED LAPAROSCOPIC RADICAL PROSTATECTOMY LEVEL 2;  Surgeon: Raynelle Bring, MD;  Location: [facility];  Service: Urology;  Laterality: N/A;    Current Medications: Current Meds  Medication Sig   Ascorbic Acid (VITAMIN C) 100 MG tablet Take 100 mg by mouth daily.   aspirin EC 81 MG tablet Take 1 tablet (81 mg total) by mouth daily. Swallow whole.   b complex vitamins tablet Take 1 tablet by mouth daily.   Menthol, Topical Analgesic, (BENGAY EX) Apply 1 application topically daily as needed (muscle pain).   naproxen (NAPROSYN) 500 MG tablet Take 1 tablet (500 mg total) by mouth 2 (two) times daily as needed.   rosuvastatin  (CRESTOR) 20 MG tablet Take 1 tablet (20 mg total) by mouth daily.   sertraline (ZOLOFT) 50 MG tablet Take 2 tablets (100 mg total) by mouth daily.   [DISCONTINUED] Multiple Vitamin (THERA) TABS Take 1 tablet by mouth daily.     Allergies:    Patient has no known allergies.   Social History: Social History   Socioeconomic History   Marital status: Married    Spouse name: Not on file   Number of children: Not on file   Years of education: 16   Highest education level: Bachelor's degree (e.g., BA, AB, BS)  Occupational History   Occupation: Retired   Occupation: Retired - Engineer, mining  Tobacco Use   Smoking status: Never   Smokeless tobacco: Never  Vaping Use   Vaping Use: Never used  Substance and Sexual Activity   Alcohol use: No   Drug use: No   Sexual activity: Yes    Partners: Female    Comment: Married  Other Topics Concern   Not on file  Social History Narrative   Fun:    Right handed   One level home   Social Determinants of Health   Financial Resource Strain: Not on file  Food Insecurity: Not on file  Transportation Needs: Not on file  Physical Activity: Not on file  Stress: Not on file  Social Connections: Not on file     Family History: The patient's family history includes Alzheimer's disease in his maternal aunt, mother, and another family member; Alzheimer's disease (age of onset: 25) in his brother; Anxiety disorder in his sister; Prostate cancer in his father; Stroke in his mother. There is no history of Colon cancer, Colon polyps, Esophageal cancer, Rectal cancer, or Stomach cancer.  ROS:   All other ROS reviewed and negative. Pertinent positives noted in the HPI.     EKGs/Labs/Other Studies Reviewed:   The following studies were personally reviewed by me today:  EKG:  EKG is ordered today.  The ekg ordered today demonstrates normal sinus rhythm heart rate 67, no acute ischemic changes or evidence of infarction, and was personally reviewed by me.    TTE 01/04/2020  1. Left ventricular ejection fraction, by estimation, is 60 to 65%. The  left ventricle has normal function. The left ventricle has no regional  wall motion abnormalities. There is moderate asymmetric left ventricular  hypertrophy of the basal-septal  segment. Left ventricular diastolic parameters are consistent with Grade I  diastolic dysfunction (impaired relaxation). Elevated left ventricular  end-diastolic pressure.   2. Right ventricular systolic function is normal. The right ventricular  size is normal. There is normal pulmonary artery systolic pressure.   3. Left atrial size was mildly dilated.   4. The  mitral valve is normal in structure. Trivial mitral valve  regurgitation. No evidence of mitral stenosis.   5. The aortic valve is tricuspid. Aortic valve regurgitation is not  visualized. No aortic stenosis is present.   6. The inferior vena cava is normal in size with greater than 50%  respiratory variability, suggesting right atrial pressure of 3 mmHg.   Recent Labs: 12/30/2021: ALT 14; BUN 13; Creatinine, Ser 0.70; Hemoglobin 13.1; Platelets 245.0; Potassium 3.8; Sodium 140; TSH 2.81   Recent Lipid Panel    Component Value Date/Time   CHOL 160 12/30/2021 1418   TRIG 97.0 12/30/2021 1418   HDL 41.70 12/30/2021 1418   CHOLHDL 4 12/30/2021 1418   VLDL 19.4 12/30/2021 1418   LDLCALC 99 12/30/2021 1418    Physical Exam:   VS:  BP 130/66 (BP Location: Left Arm, Patient Position: Sitting, Cuff Size: Normal)   Pulse 67   Ht 5' 10.5" (1.791 m)   Wt 191 lb (86.6 kg)   BMI 27.02 kg/m    Wt Readings from Last 3 Encounters:  03/06/22 191 lb (86.6 kg)  02/13/22 191 lb 6 oz (86.8 kg)  12/30/21 188 lb (85.3 kg)    General: Well nourished, well developed, in no acute distress Head: Atraumatic, normal size  Eyes: PEERLA, EOMI  Neck: Supple, no JVD Endocrine: No thryomegaly Cardiac: Normal S1, S2; RRR; 2 out of 6 holosystolic murmur Lungs: Clear to  auscultation bilaterally, no wheezing, rhonchi or rales  Abd: Soft, nontender, no hepatomegaly  Ext: No edema, pulses 2+ Musculoskeletal: No deformities, BUE and BLE strength normal and equal Skin: Warm and dry, no rashes   Neuro: Alert and oriented to person, place, time, and situation, CNII-XII grossly intact, no focal deficits  Psych: Normal mood and affect   ASSESSMENT:   Russell Sampson is a 71 y.o. male who presents for the following: 1. Agatston coronary artery calcium score greater than 400   2. Coronary artery disease involving native coronary artery of native heart without angina pectoris   3. Mixed hyperlipidemia   4. Murmur     PLAN:   1. Coronary artery disease involving native coronary artery of native heart without angina pectoris 2. Agatston coronary artery calcium score greater than 400 3. Mixed hyperlipidemia -Coronary calcium score of 777 which is 84th percentile.  Does report some exertional shortness of breath when he does activity.  Could be related to deconditioning.  EKG is normal.  We discussed an exercise nuclear medicine stress test to make sure he has no obstructive CAD.  In the interim would recommend to continue aspirin as well as statin therapy.  On Crestor 20 mg daily.  He will come back in 2 weeks for repeat fasting lipids.  We will also check an LP(a).  There is no strong family history of heart disease.  Would recommend just continue with aggressive lifestyle modification.  We discussed controlling his cholesterol, exercising more as well as proper diet.  Seems to be doing all of these other activities quite well.  He also has a murmur on exam.  See discussion below.  Echo in 2021 was unremarkable.  4. Murmur -Repeat echo.  Echo in 2021 was unremarkable.  He does have a murmur which could suggest mitral valve regurgitation.  We will get a better look on a repeat echocardiogram.  Shared Decision Making/Informed Consent  The risks [chest pain, shortness of  breath, cardiac arrhythmias, dizziness, blood pressure fluctuations, myocardial infarction, stroke/transient ischemic attack, nausea, vomiting,  allergic reaction, radiation exposure, metallic taste sensation and life-threatening complications (estimated to be 1 in 10,000)], benefits (risk stratification, diagnosing coronary artery disease, treatment guidance) and alternatives of a nuclear stress test were discussed in detail with Russell Sampson and he agrees to proceed.  Disposition: Return in about 1 year (around 03/07/2023).  Medication Adjustments/Labs and Tests Ordered: Current medicines are reviewed at length with the patient today.  Concerns regarding medicines are outlined above.  Orders Placed This Encounter  Procedures   Lipoprotein A (LPA)   Lipid panel   MYOCARDIAL PERFUSION IMAGING   EKG 12-Lead   ECHOCARDIOGRAM COMPLETE   No orders of the defined types were placed in this encounter.   Patient Instructions  Medication Instructions:  The current medical regimen is effective;  continue present plan and medications.  *If you need a refill on your cardiac medications before your next appointment, please call your pharmacy*   Lab Work: LIPID, LPa in 2 weeks (come back fasting, nothing to eat or drink, no lab appointment needed)   If you have labs (blood work) drawn today and your tests are completely normal, you will receive your results only by: [place] (if you have MyChart) OR A paper copy in the mail If you have any lab test that is abnormal or we need to change your treatment, we will call you to review the results.   Testing/Procedures: Echocardiogram - Your physician has requested that you have an echocardiogram. Echocardiography is a painless test that uses sound waves to create images of your heart. It provides your doctor with information about the size and shape of your heart and how well your heart's chambers and valves are working. This procedure takes  approximately one hour. There are no restrictions for this procedure.   Your physician has requested that you have an Exercise Myoview. A cardiac stress test is a cardiological test that measures the heart's ability to respond to external stress in a controlled clinical environment. The stress response is induced by exercise (exercise-treadmill). For further information please visit [email or web address].[email or web address]. If you have questions or concerns about your appointment, you can call the Nuclear Lab at [phone].    Follow-Up: At [facility], you and your health needs are our priority.  As part of our continuing mission to provide you with exceptional heart care, we have created designated Provider Care Teams.  These Care Teams include your primary Cardiologist (physician) and Advanced Practice Providers (APPs -  Physician Assistants and Nurse Practitioners) who all work together to provide you with the care you need, when you need it.  We recommend signing up for the patient portal called "MyChart".  Sign up information is provided on this After Visit Summary.  MyChart is used to connect with patients for Virtual Visits (Telemedicine).  Patients are able to view lab/test results, encounter notes, upcoming appointments, etc.  Non-urgent messages can be sent to your provider as well.   To learn more about what you can do with MyChart, go to [email or web address].    Your next appointment:   12 month(s)  The format for your next appointment:   In Person  Provider:   Eleonore Chiquito, MD            Signed, Addison Naegeli. Audie Box, MD, [place]  [address], [place], [place] [address] [phone]  03/06/2022 3:34 PM

## 2022-03-06 ENCOUNTER — Encounter: Payer: Self-pay | Admitting: Cardiovascular Disease

## 2022-03-06 ENCOUNTER — Ambulatory Visit (INDEPENDENT_AMBULATORY_CARE_PROVIDER_SITE_OTHER): Payer: Medicare Other | Admitting: Cardiovascular Disease

## 2022-03-06 VITALS — BP 130/66 | HR 67 | Ht 70.5 in | Wt 191.0 lb

## 2022-03-06 DIAGNOSIS — E782 Mixed hyperlipidemia: Secondary | ICD-10-CM

## 2022-03-06 DIAGNOSIS — R931 Abnormal findings on diagnostic imaging of heart and coronary circulation: Secondary | ICD-10-CM | POA: Diagnosis not present

## 2022-03-06 DIAGNOSIS — I2510 Atherosclerotic heart disease of native coronary artery without angina pectoris: Secondary | ICD-10-CM | POA: Diagnosis not present

## 2022-03-06 DIAGNOSIS — R011 Cardiac murmur, unspecified: Secondary | ICD-10-CM | POA: Diagnosis not present

## 2022-03-06 NOTE — Patient Instructions (Signed)
Medication Instructions:  The current medical regimen is effective;  continue present plan and medications.  *If you need a refill on your cardiac medications before your next appointment, please call your pharmacy*   Lab Work: LIPID, LPa in 2 weeks (come back fasting, nothing to eat or drink, no lab appointment needed)   If you have labs (blood work) drawn today and your tests are completely normal, you will receive your results only by: [place] (if you have MyChart) OR A paper copy in the mail If you have any lab test that is abnormal or we need to change your treatment, we will call you to review the results.   Testing/Procedures: Echocardiogram - Your physician has requested that you have an echocardiogram. Echocardiography is a painless test that uses sound waves to create images of your heart. It provides your doctor with information about the size and shape of your heart and how well your heart's chambers and valves are working. This procedure takes approximately one hour. There are no restrictions for this procedure.   Your physician has requested that you have an Exercise Myoview. A cardiac stress test is a cardiological test that measures the heart's ability to respond to external stress in a controlled clinical environment. The stress response is induced by exercise (exercise-treadmill). For further information please visit [email or web address].[email or web address]. If you have questions or concerns about your appointment, you can call the Nuclear Lab at [phone].    Follow-Up: At [facility], you and your health needs are our priority.  As part of our continuing mission to provide you with exceptional heart care, we have created designated Provider Care Teams.  These Care Teams include your primary Cardiologist (physician) and Advanced Practice Providers (APPs -  Physician Assistants and Nurse Practitioners) who all work together to provide you with the care you need, when you need  it.  We recommend signing up for the patient portal called "MyChart".  Sign up information is provided on this After Visit Summary.  MyChart is used to connect with patients for Virtual Visits (Telemedicine).  Patients are able to view lab/test results, encounter notes, upcoming appointments, etc.  Non-urgent messages can be sent to your provider as well.   To learn more about what you can do with MyChart, go to [email or web address].    Your next appointment:   12 month(s)  The format for your next appointment:   In Person  Provider:   Eleonore Chiquito, MD

## 2022-03-06 NOTE — Addendum Note (Signed)
Addended by: Geralynn Rile on: 03/06/2022 03:35 PM   Modules accepted: Orders

## 2022-03-18 ENCOUNTER — Encounter: Payer: Self-pay | Admitting: Physician Assistant

## 2022-03-18 ENCOUNTER — Ambulatory Visit (INDEPENDENT_AMBULATORY_CARE_PROVIDER_SITE_OTHER): Payer: Medicare Other | Admitting: Physician Assistant

## 2022-03-18 VITALS — BP 150/75 | HR 83 | Resp 18 | Ht 70.5 in | Wt 194.0 lb

## 2022-03-18 DIAGNOSIS — G3184 Mild cognitive impairment, so stated: Secondary | ICD-10-CM

## 2022-03-18 DIAGNOSIS — R931 Abnormal findings on diagnostic imaging of heart and coronary circulation: Secondary | ICD-10-CM

## 2022-03-18 MED ORDER — MEMANTINE HCL 5 MG PO TABS: ORAL_TABLET | ORAL | 11 refills | Status: DC

## 2022-03-18 NOTE — Patient Instructions (Signed)
It was a pleasure to see you today at our office.   Recommendations:  Follow up in 6 months  Start Memantine '5mg'$  tablets.  Take 1 tablet at bedtime for 2 weeks, then 1 tablet twice daily.    Recommend strongly to seek  Cognitive Behavioral Therapy   RECOMMENDATIONS FOR ALL PATIENTS WITH MEMORY PROBLEMS: 1. Continue to exercise (Recommend 30 minutes of walking everyday, or 3 hours every week) 2. Increase social interactions - continue going to [place] and enjoy social gatherings with friends and family 3. Eat healthy, avoid fried foods and eat more fruits and vegetables 4. Maintain adequate blood pressure, blood sugar, and blood cholesterol level. Reducing the risk of stroke and cardiovascular disease also helps promoting better memory. 5. Avoid stressful situations. Live a simple life and avoid aggravations. Organize your time and prepare for the next day in anticipation. 6. Sleep well, avoid any interruptions of sleep and avoid any distractions in the bedroom that may interfere with adequate sleep quality 7. Avoid sugar, avoid sweets as there is a strong link between excessive sugar intake, diabetes, and cognitive impairment We discussed the Mediterranean diet, which has been shown to help patients reduce the risk of progressive memory disorders and reduces cardiovascular risk. This includes eating fish, eat fruits and green leafy vegetables, nuts like almonds and hazelnuts, walnuts, and also use olive oil. Avoid fast foods and fried foods as much as possible. Avoid sweets and sugar as sugar use has been linked to worsening of memory function.  There is always a concern of gradual progression of memory problems. If this is the case, then we may need to adjust level of care according to patient needs. Support, both to the patient and caregiver, should then be put into place.      You have been referred for a neuropsychological evaluation (i.e., evaluation of memory and thinking abilities).  Please bring someone with you to this appointment if possible, as it is helpful for the doctor to hear from both you and another adult who knows you well. Please bring eyeglasses and hearing aids if you wear them.    The evaluation will take approximately 3 hours and has two parts:   The first part is a clinical interview with the neuropsychologist (Dr. Melvyn Novas or Dr. Nicole Kindred). During the interview, the neuropsychologist will speak with you and the individual you brought to the appointment.    The second part of the evaluation is testing with the doctor's technician Hinton Dyer or Maudie Mercury). During the testing, the technician will ask you to remember different types of material, solve problems, and answer some questionnaires. Your family member will not be present for this portion of the evaluation.   Please note: We must reserve several hours of the neuropsychologist's time and the psychometrician's time for your evaluation appointment. As such, there is a No-Show fee of $100. If you are unable to attend any of your appointments, please contact our office as soon as possible to reschedule.    FALL PRECAUTIONS: Be cautious when walking. Scan the area for obstacles that may increase the risk of trips and falls. When getting up in the mornings, sit up at the edge of the bed for a few minutes before getting out of bed. Consider elevating the bed at the head end to avoid drop of blood pressure when getting up. Walk always in a well-lit room (use night lights in the walls). Avoid area rugs or power cords from appliances in the middle of the walkways.  Use a walker or a cane if necessary and consider physical therapy for balance exercise. Get your eyesight checked regularly.  FINANCIAL OVERSIGHT: Supervision, especially oversight when making financial decisions or transactions is also recommended.  HOME SAFETY: Consider the safety of the kitchen when operating appliances like stoves, microwave oven, and blender. Consider  having supervision and share cooking responsibilities until no longer able to participate in those. Accidents with firearms and other hazards in the house should be identified and addressed as well.   ABILITY TO BE LEFT ALONE: If patient is unable to contact 911 operator, consider using LifeLine, or when the need is there, arrange for someone to stay with patients. Smoking is a fire hazard, consider supervision or cessation. Risk of wandering should be assessed by caregiver and if detected at any point, supervision and safe proof recommendations should be instituted.  MEDICATION SUPERVISION: Inability to self-administer medication needs to be constantly addressed. Implement a mechanism to ensure safe administration of the medications.   DRIVING: Regarding driving, in patients with progressive memory problems, driving will be impaired. We advise to have someone else do the driving if trouble finding directions or if minor accidents are reported. Independent driving assessment is available to determine safety of driving.   If you are interested in the driving assessment, you can contact the following:  [employer] in [place]  [place] [phone] or [phone]    Joy refers to food and lifestyle choices that are based on the traditions of countries located on the [employer]. This way of eating has been shown to help prevent certain conditions and improve outcomes for people who have chronic diseases, like kidney disease and heart disease. What are tips for following this plan? Lifestyle  Cook and eat meals together with your family, when possible. Drink enough fluid to keep your urine clear or pale yellow. Be physically active every day. This includes: Aerobic exercise like running or swimming. Leisure activities like  gardening, walking, or housework. Get 7-8 hours of sleep each night. If recommended by your health care provider, drink red wine in moderation. This means 1 glass a day for nonpregnant women and 2 glasses a day for men. A glass of wine equals 5 oz (150 mL). Reading food labels  Check the serving size of packaged foods. For foods such as rice and pasta, the serving size refers to the amount of cooked product, not dry. Check the total fat in packaged foods. Avoid foods that have saturated fat or trans fats. Check the ingredients list for added sugars, such as corn syrup. Shopping  At the grocery store, buy most of your food from the areas near the walls of the store. This includes: Fresh fruits and vegetables (produce). Grains, beans, nuts, and seeds. Some of these may be available in unpackaged forms or large amounts (in bulk). Fresh seafood. Poultry and eggs. Low-fat dairy products. Buy whole ingredients instead of prepackaged foods. Buy fresh fruits and vegetables in-season from local farmers markets. Buy frozen fruits and vegetables in resealable bags. If you do not have access to quality fresh seafood, buy precooked frozen shrimp or canned fish, such as tuna, salmon, or sardines. Buy small amounts of raw or cooked vegetables, salads, or olives from the deli or salad bar at your store. Stock your pantry so you always have certain foods on hand, such as olive oil, canned tuna, canned tomatoes, rice,  pasta, and beans. Cooking  Cook foods with extra-virgin olive oil instead of using butter or other vegetable oils. Have meat as a side dish, and have vegetables or grains as your main dish. This means having meat in small portions or adding small amounts of meat to foods like pasta or stew. Use beans or vegetables instead of meat in common dishes like chili or lasagna. Experiment with different cooking methods. Try roasting or broiling vegetables instead of steaming or sauteing them. Add frozen  vegetables to soups, stews, pasta, or rice. Add nuts or seeds for added healthy fat at each meal. You can add these to yogurt, salads, or vegetable dishes. Marinate fish or vegetables using olive oil, lemon juice, garlic, and fresh herbs. Meal planning  Plan to eat 1 vegetarian meal one day each week. Try to work up to 2 vegetarian meals, if possible. Eat seafood 2 or more times a week. Have healthy snacks readily available, such as: Vegetable sticks with hummus. Greek yogurt. Fruit and nut trail mix. Eat balanced meals throughout the week. This includes: Fruit: 2-3 servings a day Vegetables: 4-5 servings a day Low-fat dairy: 2 servings a day Fish, poultry, or lean meat: 1 serving a day Beans and legumes: 2 or more servings a week Nuts and seeds: 1-2 servings a day Whole grains: 6-8 servings a day Extra-virgin olive oil: 3-4 servings a day Limit red meat and sweets to only a few servings a month What are my food choices? Mediterranean diet Recommended Grains: Whole-grain pasta. Brown rice. Bulgar wheat. Polenta. Couscous. Whole-wheat bread. [place]. Vegetables: Artichokes. Beets. Broccoli. Cabbage. Carrots. Eggplant. Green beans. Chard. Kale. Spinach. Onions. Leeks. Peas. Squash. Tomatoes. Peppers. Radishes. Fruits: Apples. Apricots. Avocado. Berries. Bananas. Cherries. Dates. Figs. Grapes. Lemons. Melon. Oranges. Peaches. Plums. Pomegranate. Meats and other protein foods: Beans. Almonds. Sunflower seeds. Pine nuts. Peanuts. [place]. Salmon. Scallops. Shrimp. [place]. Tilapia. Clams. Oysters. Eggs. Dairy: Low-fat milk. Cheese. Greek yogurt. Beverages: Water. Red wine. Herbal tea. Fats and oils: Extra virgin olive oil. Avocado oil. Grape seed oil. Sweets and desserts: [place] yogurt with honey. Baked apples. Poached pears. Trail mix. Seasoning and other foods: Basil. Cilantro. Coriander. Cumin. Mint. Parsley. Sage. Rosemary. Tarragon. Garlic. Oregano. Thyme. Pepper. Balsalmic vinegar.  Tahini. Hummus. Tomato sauce. Olives. Mushrooms. Limit these Grains: Prepackaged pasta or rice dishes. Prepackaged cereal with added sugar. Vegetables: Deep fried potatoes (french fries). Fruits: Fruit canned in syrup. Meats and other protein foods: Beef. Pork. Lamb. Poultry with skin. Hot dogs. [place] Salines. Dairy: Ice cream. Sour cream. Whole milk. Beverages: Juice. Sugar-sweetened soft drinks. Beer. Liquor and spirits. Fats and oils: Butter. Canola oil. Vegetable oil. Beef fat (tallow). Lard. Sweets and desserts: Cookies. Cakes. Pies. Candy. Seasoning and other foods: Mayonnaise. Premade sauces and marinades. The items listed may not be a complete list. Talk with your dietitian about what dietary choices are right for you. Summary The Mediterranean diet includes both food and lifestyle choices. Eat a variety of fresh fruits and vegetables, beans, nuts, seeds, and whole grains. Limit the amount of red meat and sweets that you eat. Talk with your health care provider about whether it is safe for you to drink red wine in moderation. This means 1 glass a day for nonpregnant women and 2 glasses a day for men. A glass of wine equals 5 oz (150 mL). This information is not intended to replace advice given to you by your health care provider. Make sure you discuss any questions you have with your health care  provider. Document Released: 03/27/2016 Document Revised: 04/29/2016 Document Reviewed: 03/27/2016 Elsevier Interactive Patient Education  2017 [employer].  We have sent a referral to [facility] for your MRI and they will call you directly to schedule your appointment. They are located at [place]. If you need to contact them directly please call [phone].

## 2022-03-18 NOTE — Progress Notes (Signed)
Assessment/Plan:   Mild Cognitive Impairment  Generalized anxiety disorder  Russell Sampson is a very pleasant 71 y.o. RH male with a history of severe anxiety, hypertension, hyperlipidemia presenting today in follow-up for evaluation of memory loss.  He had a neurocognitive testing which was not completely diagnostic, although in some areas, he showed amnestic results.  His anxiety still not under control. MRI of the brain on 08/05/2021 was remarkable for unchanged minimal chronic small vessel ischemic disease and mild cerebral atrophy.  No acute findings. Last MoCA on 07/25/2021 was 28/30.  He is overall stable from the cognitive standpoint.  However, after neurocognitive evaluation, it was recommended that the patient be started on a low-dose of antidementia medication to slow down the progression of potential disease until clarity of the diagnosis is obtained.   Recommendations:   Repeat neurocognitive testing in 12 months for evaluation of disease progression and diagnostic clarity Start memantine 5 mg, take 1 tab nightly for 2 weeks, then increase to 1 tab twice daily if tolerated.  Side effects discussed Continue behavioral therapy, close management of his generalized anxiety disorder. Follow up in 6 months.   Case discussed with Dr. Delice Lesch who agrees with the plan     Subjective:   This patient is accompanied in the office by his wife who supplements the history.  Previous records as well as any outside records available were reviewed prior to todays visit.  Patient was last seen at our office on 08/05/2021 at which time his MoCA was 28/30.  Patient is currently on    Any changes in memory since last visit? About the same, not terrible.  He reports that he is going to try to incorporate more crossword puzzles, and other brain stimulating exercises to improve his overall cognitive health. Patient lives with: Wife repeats oneself?  "A lot"-wife says . He attributes to anxiety,  he denies  Disoriented when walking into a room?  Patient denies   Leaving objects in unusual places?  Patient denies   Ambulates  with difficulty?   Patient denies.  He has joined the [facility] and has made the decision to stay tired exercising on a regular basis Recent falls?  Patient denies   Any head injuries?  Patient denies   History of seizures?   Patient denies   Wandering behavior?  Patient denies   Patient drives?   Patient no longer drives. However, she has to get him to focus before driving to prevent errors.   Any mood changes? "I am better".  "Zoloft helped ".  Any depression?:  Patient denies   Hallucinations?  Patient denies   Paranoia?  Patient denies   Patient reports that he sleeps well without vivid dreams, REM behavior or sleepwalking   History of sleep apnea?  Patient denies   Any hygiene concerns?  Patient denies   Independent of bathing and dressing?  Endorsed  Does the patient needs help with medications? Wife in charge  Who is in charge of the finances? Wife  is in charge    Any changes in appetite?  Patient denies   Patient have trouble swallowing? Patient denies   Does the patient cook?  Patient denies   Any kitchen accidents such as leaving the stove on? Patient denies   Any headaches?  Patient denies   Double vision? Patient denies   Any focal numbness or tingling?  Patient denies   Chronic back pain Patient denies   Unilateral weakness?  Patient  denies   Any tremors?  Patient denies   Any history of anosmia?  Patient denies   Any incontinence of urine?  Patient denies   Any bowel dysfunction?   Patient denies       Neurocognitive testing July 2023, Dr. Melvyn Novas . Briefly, results suggested a primary impairment surrounding both encoding (i.e., learning) and retrieval aspects of verbal memory. A relative weakness was also exhibited across phonemic fluency, while performance variability was exhibited across processing speed, executive functioning,  and recognition/consolidation aspects of verbal memory. Across three verbal memory measures, he did demonstrate a weakness learning new information efficiently. Across list and daily living tasks, he was fully amnestic (i.e., 0% retention) after a brief delay and performances were variable across yes/no recognition trials. Across a third story task, his delayed retrieval was also impaired; however, he was able to recall 67% of previously learned information. Taken together, this could suggest very early evidence for rapid forgetting and an evolving storage impairment, which are hallmark characteristics of this illness. Put more plainly, there is the potential that day-to-day instances of Mr. Schreck repeating himself and asking repetitive questions, as well as performance across testing, is due to something above and beyond day-to-day anxiety. This will need to be closely monitored over time. It is important to highlight that visual memory was appropriate and non-memory areas commonly affected early on in Alzheimer's disease (i.e., semantic fluency, confrontation naming, visuospatial abilities) remained strong.     History of Present Illness 2020 This is a very pleasant 71 year old right-handed man with a history of prostate cancer, anxiety, presenting for evaluation of memory concerns. He started becoming concerned due to his brother's diagnosis of early onset Alzheimer's dementia at age 47. His mother was diagnosed with dementia at age 61 and had it for 7 years until she passed away 13 years ago. He has 2 maternal aunts and a maternal cousin with dementia. This has caused him anxiety that he would develop dementia, he states right now his memory is pretty good and he has not noticed any memory changes himself. His wife has noticed that over the past couple of years, she would tell the same thing repeatedly, he says he is just trying to make a point. He drives without getting lost, his wife has noticed that  sometimes he does not pay attention and she would notice he would be drifting or the car behind them honks when the light changes. He manages bills and medications without difficulties. He misplaces things and would "totally freak out about it" per wife. He has always been this way, but it is more than it used to be, now occurring at least once a week. She states that his personality has always been a very anxious individual, he is a very negative individual, but over the past few months, this has worsened. He watches the 10pm news nightly and repeats twice during the visit that he has cut down on this. His wife states he thinks the world is a terrible place and he is adamant he goes with her when she goes out because someone may do something. He is not necessarily paranoid per wife, no hallucinations. She states he does not think the anxiety is a problem. She does not think the Prozac is helping.    He denies any headaches, dizziness, vision changes, dysarthria/dysphagia, neck/back pain, focal numbness/tingling/weakness, bowel/bladder dysfunction, anosmia. He denies any tremors, his wife reminds him he has occasional right hand shaking that they had checked out  several years ago, diagnosed as benign essential tremor. It does not affect writing or using utensils. He denies any falls. Sleep is good. No history of concussions or alcohol use. 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 He feels he was forced into early retirement. He does work around [employer] and with old classic cars, he helps care for his brother with dementia.    Past Medical History:  Diagnosis Date   Arthritis of finger of left hand 07/08/2021   Benign essential tremor    Right-hand   Benign neoplasm of ascending colon    Benign neoplasm of cecum    Benign neoplasm of sigmoid colon    Benign neoplasm of transverse colon    Cataract    bilateral; removed    Chicken pox    Difficult airway for intubation 03/10/2019   Elbow pain 08/29/2013   Generalized anxiety disorder with panic attacks 01/30/2011   Greater trochanteric bursitis of right hip 03/21/2020   Heart murmur 12/14/2019   HLD (hyperlipidemia) 12/14/2019   Hyperglycemia 12/15/2020   Knee effusion, right 04/11/2020   Mild cognitive impairment of uncertain or unknown etiology    Personal history of colonic polyps 10/2007   3-4 small adenomas   Prostate cancer    Right lumbar radiculopathy 03/28/2020   Thumb tendonitis 07/21/2020   Vitamin D deficiency 02/13/2022     Past Surgical History:  Procedure Laterality Date   CLOSED REDUCTION FOREARM FRACTURE     1962   COLONOSCOPY     COLONOSCOPY W/ POLYPECTOMY  10/22/2007   3-4 adenomas, max 29m   COLONOSCOPY WITH PROPOFOL N/A 05/30/2019   Procedure: COLONOSCOPY WITH PROPOFOL;  Surgeon: GGatha Mayer MD;  Location: [facility];  Service: Endoscopy;  Laterality: N/A;   LYMPHADENECTOMY Bilateral 11/10/2016   Procedure: PELVIC LYMPHADENECTOMY;  Surgeon: LRaynelle Bring MD;  Location: [facility];  Service: Urology;  Laterality: Bilateral;   POLYPECTOMY     POLYPECTOMY  05/30/2019   Procedure: POLYPECTOMY;  Surgeon: GGatha Mayer MD;  Location: [facility];  Service: Endoscopy;;   PROSTATE BIOPSY     09/19/2016   ROBOT ASSISTED LAPAROSCOPIC RADICAL PROSTATECTOMY N/A 11/10/2016   Procedure: XI ROBOTIC ASSISTED LAPAROSCOPIC RADICAL PROSTATECTOMY LEVEL 2;  Surgeon: LRaynelle Bring MD;  Location: [facility];  Service: Urology;  Laterality: N/A;     PREVIOUS MEDICATIONS:   CURRENT MEDICATIONS:  Outpatient Encounter Medications as of 03/18/2022  Medication Sig   Ascorbic Acid (VITAMIN C) 100 MG tablet Take 100 mg by mouth daily.   aspirin EC 81 MG tablet Take 1 tablet (81 mg total) by mouth daily. Swallow whole.   b complex vitamins tablet Take 1 tablet by mouth daily.   memantine (NAMENDA) 5 MG tablet Take 1 tablet (5 mg at night) for 2 weeks,  then increase to 1 tablet (5 mg) twice a day   Menthol, Topical Analgesic, (BENGAY EX) Apply 1 application topically daily as needed (muscle pain).   naproxen (NAPROSYN) 500 MG tablet Take 1 tablet (500 mg total) by mouth 2 (two) times daily as needed.   rosuvastatin (CRESTOR) 20 MG tablet Take 1 tablet (20 mg total) by mouth daily.   sertraline (ZOLOFT) 50 MG tablet Take 2 tablets (100 mg total) by mouth daily.   VITAMIN D, CHOLECALCIFEROL, PO Take 2,000 mg by mouth.   No facility-administered encounter medications on file as of 03/18/2022.  Objective:     PHYSICAL EXAMINATION:    VITALS:   Vitals:   03/18/22 1421  BP: (!) 150/75  Pulse: 83  Resp: 18  SpO2: 96%  Weight: 194 lb (88 kg)  Height: 5' 10.5" (1.791 m)    GEN:  The patient appears stated age and is in NAD. HEENT:  Normocephalic, atraumatic.   Neurological examination:  General: NAD, well-groomed, appears stated age. Orientation: The patient is alert. Oriented to person, place and date Cranial nerves: There is good facial symmetry.The speech is fluent and clear. No aphasia or dysarthria. Fund of knowledge is appropriate. Recent memory impaired and remote memory is normal.  Attention and concentration are normal.  Able to name objects and repeat phrases.  Hearing is intact to conversational tone.    Sensation: Sensation is intact to light touch throughout Motor: Strength is at least antigravity x4. Tremors: none  DTR's 2/4 in UE/LE      02/07/2019    3:00 PM  Montreal Cognitive Assessment   Visuospatial/ Executive (0/5) 4  Naming (0/3) 3  Attention: Read list of digits (0/2) 2  Attention: Read list of letters (0/1) 1  Attention: Serial 7 subtraction starting at 100 (0/3) 3  Language: Repeat phrase (0/2) 1  Language : Fluency (0/1) 1  Abstraction (0/2) 0  Delayed Recall (0/5) 5  Orientation (0/6) 6  Total 26  Adjusted Score (based on education) 26        No data to display             Movement  examination: Tone: There is normal tone in the UE/LE Abnormal movements:  no tremor.  No myoclonus.  No asterixis.   Coordination:  There is no decremation with RAM's. Normal finger to nose  Gait and Station: The patient has no difficulty arising out of a deep-seated chair without the use of the hands. The patient's stride length is good.  Gait is cautious and narrow.   Thank you for allowing [place] the opportunity to participate in the care of this nice patient. Please do not hesitate to contact us for any questions or concerns.   Total time spent on today's visit was 35 minutes dedicated to this patient today, preparing to see patient, examining the patient, ordering tests and/or medications and counseling the patient, documenting clinical information in the EHR or other health record, independently interpreting results and communicating results to the patient/family, discussing treatment and goals, answering patient's questions and coordinating care.  Cc:  Biagio Borg, MD  Sharene Butters 03/18/2022 2:39 PM

## 2022-03-20 DIAGNOSIS — E782 Mixed hyperlipidemia: Secondary | ICD-10-CM | POA: Diagnosis not present

## 2022-03-21 ENCOUNTER — Other Ambulatory Visit: Payer: Self-pay

## 2022-03-21 LAB — LIPID PANEL
Chol/HDL Ratio: 3 ratio (ref 0.0–5.0)
Cholesterol, Total: 134 mg/dL (ref 100–199)
HDL: 45 mg/dL (ref >39)
LDL Chol Calc (NIH): 74 mg/dL (ref 0–99)
Triglycerides: 72 mg/dL (ref 0–149)
VLDL Cholesterol Cal: 15 mg/dL (ref 5–40)

## 2022-03-21 LAB — LIPOPROTEIN A (LPA): Lipoprotein (a): 92.3 nmol/L — ABNORMAL HIGH (ref <75.0)

## 2022-03-21 MED ORDER — ROSUVASTATIN CALCIUM 40 MG PO TABS: 40.0000 mg | ORAL_TABLET | Freq: Every day | ORAL | 3 refills | Status: DC

## 2022-03-25 ENCOUNTER — Telehealth (HOSPITAL_COMMUNITY): Payer: Self-pay | Admitting: *Deleted

## 2022-03-25 NOTE — Telephone Encounter (Signed)
Left message on voicemail per DPR in reference to upcoming appointment scheduled on 03/31/2022 at 8:15 with detailed instructions given per Myocardial Perfusion Study Information Sheet for the test. LM to arrive 15 minutes early, and that it is imperative to arrive on time for appointment to keep from having the test rescheduled. If you need to cancel or reschedule your appointment, please call the office within 24 hours of your appointment. Failure to do so may result in a cancellation of your appointment, and a $50 no show fee. Phone number given for call back for any questions.

## 2022-03-31 ENCOUNTER — Ambulatory Visit (HOSPITAL_BASED_OUTPATIENT_CLINIC_OR_DEPARTMENT_OTHER): Payer: Medicare Other

## 2022-03-31 ENCOUNTER — Ambulatory Visit (HOSPITAL_COMMUNITY): Payer: Medicare Other | Attending: Cardiology

## 2022-03-31 DIAGNOSIS — I2510 Atherosclerotic heart disease of native coronary artery without angina pectoris: Secondary | ICD-10-CM | POA: Diagnosis not present

## 2022-03-31 DIAGNOSIS — R011 Cardiac murmur, unspecified: Secondary | ICD-10-CM | POA: Diagnosis not present

## 2022-03-31 DIAGNOSIS — R931 Abnormal findings on diagnostic imaging of heart and coronary circulation: Secondary | ICD-10-CM | POA: Insufficient documentation

## 2022-03-31 LAB — MYOCARDIAL PERFUSION IMAGING
Estimated workload: 9.3
Exercise duration (min): 7 min
Exercise duration (sec): 30 s
LV dias vol: 91 mL (ref 62–150)
LV sys vol: 33 mL
MPHR: 151 {beats}/min
Nuc Stress EF: 64 %
Peak HR: 133 {beats}/min
Percent HR: 88 %
RPE: 19
Rest HR: 65 {beats}/min
Rest Nuclear Isotope Dose: 8.7 mCi
SDS: 0
SRS: 0
SSS: 0
Stress Nuclear Isotope Dose: 28.5 mCi
TID: 1.06

## 2022-03-31 LAB — ECHOCARDIOGRAM COMPLETE
Area-P 1/2: 2.93 cm2
Height: 70 in
S' Lateral: 2.8 cm
Weight: 3104 oz

## 2022-03-31 MED ORDER — TECHNETIUM TC 99M TETROFOSMIN IV KIT
8.7000 | PACK | Freq: Once | INTRAVENOUS | Status: AC | PRN
  Administered 2022-03-31: 8.7 via INTRAVENOUS

## 2022-03-31 MED ORDER — TECHNETIUM TC 99M TETROFOSMIN IV KIT
28.5000 | PACK | Freq: Once | INTRAVENOUS | Status: AC | PRN
  Administered 2022-03-31: 28.5 via INTRAVENOUS

## 2022-05-16 ENCOUNTER — Telehealth: Payer: Self-pay | Admitting: Internal Medicine

## 2022-05-16 NOTE — Telephone Encounter (Signed)
Left message for patient to call back and schedule Medicare Annual Wellness Visit (AWV).   Please offer to do virtually or by telephone.  Left office number and my jabber #[phone].  AWVI eligible as of 02/16/2019  Please schedule at anytime with Nurse Health Advisor.  

## 2022-05-26 NOTE — Progress Notes (Unsigned)
Subjective:   Russell Sampson is a 71 y.o. male who presents for an Initial Medicare Annual Wellness Visit. I connected with  [name] on 05/28/22 by a audio enabled telemedicine application and verified that I am speaking with the correct person using two identifiers.  Patient Location: Home  Provider Location: Home Office  I discussed the limitations of evaluation and management by telemedicine. The patient expressed understanding and agreed to proceed.  Review of Systems    Deferred to PCP Cardiac Risk Factors include: advanced age (>18mn, >>87women);dyslipidemia;male gender;hypertension     Objective:    There were no vitals filed for this visit. There is no height or weight on file to calculate BMI.     05/28/2022    8:48 AM 03/18/2022    2:18 PM 07/16/2021    2:12 PM 05/30/2019    7:30 AM 02/08/2019    8:11 AM 11/10/2016    3:45 PM 11/10/2016    9:20 AM  Advanced Directives  Does Patient Have a Medical Advance Directive? Yes Yes No Yes Yes Yes Yes  Type of AParamedicof AGardenLiving will   [place]amont[place] will  Healthcare Power of APortageLiving will  Does patient want to make changes to medical advance directive? No - Patient declined     No - Patient declined   Copy of [place] Chart? Yes - validated most recent copy scanned in chart (See row information)   No - copy requested  Yes Yes    Current Medications (verified) Outpatient Encounter Medications as of 05/28/2022  Medication Sig   acetaminophen (TYLENOL) 500 MG tablet Take 1,000 mg by mouth every 6 (six) hours as needed.   Ascorbic Acid (VITAMIN C) 100 MG tablet Take 100 mg by mouth daily.   aspirin EC 81 MG tablet Take 1 tablet (81 mg total) by mouth daily. Swallow whole.   b complex vitamins tablet Take 1 tablet by mouth daily.   memantine (NAMENDA) 5 MG tablet Take 1 tablet (5 mg at night) for 2 weeks,  then increase to 1 tablet (5 mg) twice a day   Menthol, Topical Analgesic, (BENGAY EX) Apply 1 application topically daily as needed (muscle pain).   rosuvastatin (CRESTOR) 40 MG tablet Take 1 tablet (40 mg total) by mouth daily.   sertraline (ZOLOFT) 50 MG tablet Take 2 tablets (100 mg total) by mouth daily.   VITAMIN D, CHOLECALCIFEROL, PO Take 2,000 mg by mouth.   naproxen (NAPROSYN) 500 MG tablet Take 1 tablet (500 mg total) by mouth 2 (two) times daily as needed. (Patient not taking: Reported on 05/28/2022)   No facility-administered encounter medications on file as of 05/28/2022.    Allergies (verified) Patient has no known allergies.   History: Past Medical History:  Diagnosis Date   Arthritis of finger of left hand 07/08/2021   Benign essential tremor    Right-hand   Benign neoplasm of ascending colon    Benign neoplasm of cecum    Benign neoplasm of sigmoid colon    Benign neoplasm of transverse colon    Cataract    bilateral; removed   Chicken pox    Difficult airway for intubation 03/10/2019   Elbow pain 08/29/2013   Generalized anxiety disorder with panic attacks 01/30/2011   Greater trochanteric bursitis of right hip 03/21/2020   Heart murmur 12/14/2019   HLD (hyperlipidemia) 12/14/2019   Hyperglycemia 12/15/2020   Knee  effusion, right 04/11/2020   Mild cognitive impairment of uncertain or unknown etiology    Personal history of colonic polyps 10/2007   3-4 small adenomas   Prostate cancer    Right lumbar radiculopathy 03/28/2020   Thumb tendonitis 07/21/2020   Vitamin D deficiency 02/13/2022   Past Surgical History:  Procedure Laterality Date   CLOSED REDUCTION FOREARM FRACTURE     1962   COLONOSCOPY     COLONOSCOPY W/ POLYPECTOMY  10/22/2007   3-4 adenomas, max 65m   COLONOSCOPY WITH PROPOFOL N/A 05/30/2019   Procedure: COLONOSCOPY WITH PROPOFOL;  Surgeon: GGatha Mayer MD;  Location: [facility];  Service: Endoscopy;  Laterality: N/A;    LYMPHADENECTOMY Bilateral 11/10/2016   Procedure: PELVIC LYMPHADENECTOMY;  Surgeon: LRaynelle Bring MD;  Location: [facility];  Service: Urology;  Laterality: Bilateral;   POLYPECTOMY     POLYPECTOMY  05/30/2019   Procedure: POLYPECTOMY;  Surgeon: GGatha Mayer MD;  Location: [facility];  Service: Endoscopy;;   PROSTATE BIOPSY     09/19/2016   ROBOT ASSISTED LAPAROSCOPIC RADICAL PROSTATECTOMY N/A 11/10/2016   Procedure: XI ROBOTIC ASSISTED LAPAROSCOPIC RADICAL PROSTATECTOMY LEVEL 2;  Surgeon: LRaynelle Bring MD;  Location: [facility];  Service: Urology;  Laterality: N/A;   Family History  Problem Relation Age of Onset   Alzheimer's disease Mother    Stroke Mother    Prostate cancer Father    Anxiety disorder Sister    Alzheimer's disease Brother 547      early-onset   Alzheimer's disease Other        Maternal aunt's daughter   Alzheimer's disease Maternal Aunt    Colon cancer Neg Hx    Colon polyps Neg Hx    Esophageal cancer Neg Hx    Rectal cancer Neg Hx    Stomach cancer Neg Hx    Social History   Socioeconomic History   Marital status: Married    Spouse name: Not on file   Number of children: 0   Years of education: 16   Highest education level: Bachelor's degree (e.g., BA, AB, BS)  Occupational History   Occupation: Retired   Occupation: Retired - FEngineer, mining Tobacco Use   Smoking status: Never   Smokeless tobacco: Never  Vaping Use   Vaping Use: Never used  Substance and Sexual Activity   Alcohol use: No   Drug use: No   Sexual activity: Yes    Partners: Female    Comment: Married  Other Topics Concern   Not on file  Social History Narrative   Fun:    Right handed   One level home   Social Determinants of Health   Financial Resource Strain: Low Risk  (05/28/2022)   Overall Financial Resource Strain (CARDIA)    Difficulty of Paying Living Expenses: Not hard at all  Food Insecurity: No Food Insecurity (05/28/2022)   Hunger Vital Sign    Worried About Running  Out of Food in the Last Year: Never true    Ran Out of Food in the Last Year: Never true  Transportation Needs: No Transportation Needs (05/28/2022)   PRAPARE - THydrologist(Medical): No    Lack of Transportation (Non-Medical): No  Physical Activity: Insufficiently Active (05/28/2022)   Exercise Vital Sign    Days of Exercise per Week: 4 days    Minutes of Exercise per Session: 30 min  Stress: No Stress Concern Present (05/28/2022)   [place]-  Occupational Stress Questionnaire    Feeling of Stress : Only a little  Social Connections: Socially Integrated (05/28/2022)   Social Connection and Isolation Panel [NHANES]    Frequency of Communication with Friends and Family: More than three times a week    Frequency of Social Gatherings with Friends and Family: More than three times a week    Attends Religious Services: More than 4 times per year    Active Member of [employer] or Organizations: Yes    Attends Music therapist: More than 4 times per year    Marital Status: Married    Tobacco Counseling Counseling given: Not Answered   Clinical Intake:  Pre-visit preparation completed: Yes  Pain : No/denies pain     Nutritional Status: BMI 25 -29 Overweight Nutritional Risks: None Diabetes: No  How often do you need to have someone help you when you read instructions, pamphlets, or other written materials from your doctor or pharmacy?: 4 - Often (memory issues; wife assist) What is the last grade level you completed in school?: college  Diabetic?No  Interpreter Needed?: No  Information entered by :: Emelia Loron RN   Activities of Daily Living    05/28/2022    8:47 AM 07/01/2021   10:36 AM  In your present state of health, do you have any difficulty performing the following activities:  Hearing? 0 0  Vision? 0 0  Difficulty concentrating or making decisions? 0 0  Walking or climbing stairs? 0 0   Dressing or bathing? 0 0  Doing errands, shopping? 0 0  Preparing Food and eating ? N   Using the Toilet? N   In the past six months, have you accidently leaked urine? N   Do you have problems with loss of bowel control? N   Managing your Medications? Y   Comment wife assists   Managing your Finances? N   Housekeeping or managing your Housekeeping? N     Patient Care Team: Biagio Borg, MD as PCP - General (Internal Medicine) Cameron Sprang, MD as Consulting Physician (Neurology)  Indicate any recent Medical Services you may have received from other than [facility] providers in the past year (date may be approximate).     Assessment:   This is a routine wellness examination for Russell Sampson.  Hearing/Vision screen No results found.  Dietary issues and exercise activities discussed: Current Exercise Habits: Home exercise routine, Type of exercise: walking, Time (Minutes): 30, Frequency (Times/Week): 5, Weekly Exercise (Minutes/Week): 150, Intensity: Mild, Exercise limited by: orthopedic condition(s)   Goals Addressed             This Visit's Progress    Patient Stated       Patient Stated       Stay as active as possible.      Depression Screen    05/28/2022    8:49 AM 02/13/2022   11:11 AM 12/30/2021    1:37 PM 12/30/2021    1:18 PM 12/14/2020    4:04 PM 12/14/2020    3:30 PM 12/14/2019    3:29 PM  PHQ 2/9 Scores  PHQ - 2 Score 1 0 0 0 0 0 1  PHQ- 9 Score  0  0       Fall Risk    05/28/2022    8:48 AM 03/18/2022    2:18 PM 02/13/2022   11:11 AM 12/30/2021    1:37 PM 12/30/2021    1:18 PM  Fall Risk   Falls in  the past year? 0 0 0 0 0  Number falls in past yr: 0 0  0 0  Injury with Fall? 0 0  0 0  Risk for fall due to : History of fall(s)      Follow up Falls evaluation completed        [place]:  Any stairs in or around the home? No  If so, are there any without handrails?  N/A Home free of loose throw rugs in walkways, pet  beds, electrical cords, etc? Yes  Adequate lighting in your home to reduce risk of falls? Yes   ASSISTIVE DEVICES UTILIZED TO PREVENT FALLS:  Life alert? No  Use of a cane, walker or w/c? No  Grab bars in the bathroom? No  Shower chair or bench in shower? No  Elevated toilet seat or a handicapped toilet? No   Cognitive Function:      02/07/2019    3:00 PM  Montreal Cognitive Assessment   Visuospatial/ Executive (0/5) 4  Naming (0/3) 3  Attention: Read list of digits (0/2) 2  Attention: Read list of letters (0/1) 1  Attention: Serial 7 subtraction starting at 100 (0/3) 3  Language: Repeat phrase (0/2) 1  Language : Fluency (0/1) 1  Abstraction (0/2) 0  Delayed Recall (0/5) 5  Orientation (0/6) 6  Total 26  Adjusted Score (based on education) 26      05/28/2022    8:50 AM  6CIT Screen  What Year? 0 points  What month? 0 points  What time? 0 points  Count back from 20 0 points  Months in reverse 0 points  Repeat phrase 0 points  Total Score 0 points    Immunizations Immunization History  Administered Date(s) Administered   Fluad Quad(high Dose 65+) 07/01/2021   Influenza Split 06/05/2012   Influenza, High Dose Seasonal PF 04/05/2019   Influenza,inj,Quad PF,6+ Mos 04/16/2015, 05/12/2017   Influenza-Unspecified 03/26/2016, 04/18/2018   Moderna Sars-Covid-2 Vaccination 10/13/2019, 11/11/2019   PFIZER(Purple Top)SARS-COV-2 Vaccination 08/01/2020   Pneumococcal Conjugate-13 07/04/2018   Pneumococcal Polysaccharide-23 06/18/2018   Tdap 08/03/2017   Zoster Recombinat (Shingrix) 07/10/2017, 08/24/2017   Zoster, Live 04/16/2015    TDAP status: Up to date  Flu Vaccine status: Due, Education has been provided regarding the importance of this vaccine. Advised may receive this vaccine at local pharmacy or Health Dept. Aware to provide a copy of the vaccination record if obtained from local pharmacy or Health Dept. Verbalized acceptance and understanding.  Pneumococcal  vaccine status: Up to date  Covid-19 vaccine status: Information provided on how to obtain vaccines.   Qualifies for Shingles Vaccine? Yes   Zostavax completed No   Shingrix Completed?: Yes  Screening Tests Health Maintenance  Topic Date Due   COVID-19 Vaccine (4 - Mixed Product risk series) 09/26/2020   INFLUENZA VACCINE  11/16/2022 (Originally 03/18/2022)   COLONOSCOPY (Pts 45-25yr Insurance coverage will need to be confirmed)  05/29/2022   TETANUS/TDAP  08/04/2027   Pneumonia Vaccine 71 Years old  Completed   Hepatitis C Screening  Completed   Zoster Vaccines- Shingrix  Completed   HPV VACCINES  Aged Out    Health Maintenance  Health Maintenance Due  Topic Date Due   COVID-19 Vaccine (4 - Mixed Product risk series) 09/26/2020    Colorectal cancer screening: Type of screening: Colonoscopy. Completed 05/30/19. Repeat every 3 years  Lung Cancer Screening: (Low Dose CT Chest recommended if Age 69-80years, 30 pack-year currently  smoking OR have quit w/in 15years.) does qualify.   Additional Screening:  Hepatitis C Screening: does qualify; Completed 08/03/17  Vision Screening: Recommended annual ophthalmology exams for early detection of glaucoma and other disorders of the eye. Is the patient up to date with their annual eye exam?  Yes  Who is the provider or what is the name of the office in which the patient attends annual eye exams? Dr. Kathlen Mody If pt is not established with a provider, would they like to be referred to a provider to establish care?  N/A .   Dental Screening: Recommended annual dental exams for proper oral hygiene  Community Resource Referral / Chronic Care Management: CRR required this visit?  No   CCM required this visit?  No      Plan:     I have personally reviewed and noted the following in the patient's chart:   Medical and social history Use of alcohol, tobacco or illicit drugs  Current medications and supplements including opioid  prescriptions. Patient is not currently taking opioid prescriptions. Functional ability and status Nutritional status Physical activity Advanced directives List of other physicians Hospitalizations, surgeries, and ER visits in previous 12 months Vitals Screenings to include cognitive, depression, and falls Referrals and appointments  In addition, I have reviewed and discussed with patient certain preventive protocols, quality metrics, and best practice recommendations. A written personalized care plan for preventive services as well as general preventive health recommendations were provided to patient.     Michiel Cowboy, RN   05/28/2022   Nurse Notes:  Russell Sampson , Thank you for taking time to come for your Medicare Wellness Visit. I appreciate your ongoing commitment to your health goals. Please review the following plan we discussed and let me know if I can assist you in the future.   These are the goals we discussed:  Goals      Patient Stated     Patient Stated     Stay as active as possible.        This is a list of the screening recommended for you and due dates:  Health Maintenance  Topic Date Due   COVID-19 Vaccine (4 - Mixed Product risk series) 09/26/2020   Flu Shot  11/16/2022*   Colon Cancer Screening  05/29/2022   Tetanus Vaccine  08/04/2027   Pneumonia Vaccine  Completed   Hepatitis C Screening: USPSTF Recommendation to screen - Ages 18-79 yo.  Completed   Zoster (Shingles) Vaccine  Completed   HPV Vaccine  Aged Out  *Topic was postponed. The date shown is not the original due date.

## 2022-05-26 NOTE — Patient Instructions (Signed)
Health Maintenance, Male Adopting a healthy lifestyle and getting preventive care are important in promoting health and wellness. Ask your health care provider about: The right schedule for you to have regular tests and exams. Things you can do on your own to prevent diseases and keep yourself healthy. What should I know about diet, weight, and exercise? Eat a healthy diet  Eat a diet that includes plenty of vegetables, fruits, low-fat dairy products, and lean protein. Do not eat a lot of foods that are high in solid fats, added sugars, or sodium. Maintain a healthy weight Body mass index (BMI) is a measurement that can be used to identify possible weight problems. It estimates body fat based on height and weight. Your health care provider can help determine your BMI and help you achieve or maintain a healthy weight. Get regular exercise Get regular exercise. This is one of the most important things you can do for your health. Most adults should: Exercise for at least 150 minutes each week. The exercise should increase your heart rate and make you sweat (moderate-intensity exercise). Do strengthening exercises at least twice a week. This is in addition to the moderate-intensity exercise. Spend less time sitting. Even light physical activity can be beneficial. Watch cholesterol and blood lipids Have your blood tested for lipids and cholesterol at 69 years of age, then have this test every 5 years. You may need to have your cholesterol levels checked more often if: Your lipid or cholesterol levels are high. You are older than 69 years of age. You are at high risk for heart disease. What should I know about cancer screening? Many types of cancers can be detected early and may often be prevented. Depending on your health history and family history, you may need to have cancer screening at various ages. This may include screening for: Colorectal cancer. Prostate cancer. Skin cancer. Lung  cancer. What should I know about heart disease, diabetes, and high blood pressure? Blood pressure and heart disease High blood pressure causes heart disease and increases the risk of stroke. This is more likely to develop in people who have high blood pressure readings or are overweight. Talk with your health care provider about your target blood pressure readings. Have your blood pressure checked: Every 3-5 years if you are 18-39 years of age. Every year if you are 40 years old or older. If you are between the ages of 65 and 75 and are a current or former smoker, ask your health care provider if you should have a one-time screening for abdominal aortic aneurysm (AAA). Diabetes Have regular diabetes screenings. This checks your fasting blood sugar level. Have the screening done: Once every three years after age 45 if you are at a normal weight and have a low risk for diabetes. More often and at a younger age if you are overweight or have a high risk for diabetes. What should I know about preventing infection? Hepatitis B If you have a higher risk for hepatitis B, you should be screened for this virus. Talk with your health care provider to find out if you are at risk for hepatitis B infection. Hepatitis C Blood testing is recommended for: Everyone born from 1945 through 1965. Anyone with known risk factors for hepatitis C. Sexually transmitted infections (STIs) You should be screened each year for STIs, including gonorrhea and chlamydia, if: You are sexually active and are younger than 69 years of age. You are older than 69 years of age and your   health care provider tells you that you are at risk for this type of infection. Your sexual activity has changed since you were last screened, and you are at increased risk for chlamydia or gonorrhea. Ask your health care provider if you are at risk. Ask your health care provider about whether you are at high risk for HIV. Your health care provider  may recommend a prescription medicine to help prevent HIV infection. If you choose to take medicine to prevent HIV, you should first get tested for HIV. You should then be tested every 3 months for as long as you are taking the medicine. Follow these instructions at home: Alcohol use Do not drink alcohol if your health care provider tells you not to drink. If you drink alcohol: Limit how much you have to 0-2 drinks a day. Know how much alcohol is in your drink. In the [place] one drink equals one 12 oz bottle of beer (355 mL), one 5 oz glass of Jensine Luz (148 mL), or one 1 oz glass of hard liquor (44 mL). Lifestyle Do not use any products that contain nicotine or tobacco. These products include cigarettes, chewing tobacco, and vaping devices, such as e-cigarettes. If you need help quitting, ask your health care provider. Do not use street drugs. Do not share needles. Ask your health care provider for help if you need support or information about quitting drugs. General instructions Schedule regular health, dental, and eye exams. Stay current with your vaccines. Tell your health care provider if: You often feel depressed. You have ever been abused or do not feel safe at home. Summary Adopting a healthy lifestyle and getting preventive care are important in promoting health and wellness. Follow your health care provider's instructions about healthy diet, exercising, and getting tested or screened for diseases. Follow your health care provider's instructions on monitoring your cholesterol and blood pressure. This information is not intended to replace advice given to you by your health care provider. Make sure you discuss any questions you have with your health care provider. Document Revised: 12/24/2020 Document Reviewed: 12/24/2020 Elsevier Patient Education  2023 Elsevier Inc.  

## 2022-05-28 ENCOUNTER — Ambulatory Visit (INDEPENDENT_AMBULATORY_CARE_PROVIDER_SITE_OTHER): Payer: Medicare Other | Admitting: *Deleted

## 2022-05-28 DIAGNOSIS — Z0000 Encounter for general adult medical examination without abnormal findings: Secondary | ICD-10-CM

## 2022-05-30 DIAGNOSIS — Z23 Encounter for immunization: Secondary | ICD-10-CM | POA: Diagnosis not present

## 2022-06-19 ENCOUNTER — Telehealth: Payer: Self-pay | Admitting: Physician Assistant

## 2022-06-19 NOTE — Telephone Encounter (Signed)
Patient's wife called and said she'd like to speak with Russell Sampson about patient's memory getting worse over the last few months since testing. He is having to ask to be reminded of the same things up to 5 times a day, that is up from 2.

## 2022-06-20 ENCOUNTER — Other Ambulatory Visit: Payer: Self-pay | Admitting: Physician Assistant

## 2022-06-20 MED ORDER — MEMANTINE HCL 10 MG PO TABS: ORAL_TABLET | ORAL | 11 refills | Status: DC

## 2022-06-20 NOTE — Progress Notes (Signed)
Error

## 2022-06-20 NOTE — Addendum Note (Signed)
Addended by: Sharene Butters E on: 06/20/2022 08:42 AM   Modules accepted: Orders

## 2022-07-02 ENCOUNTER — Ambulatory Visit: Payer: No Typology Code available for payment source | Admitting: Internal Medicine

## 2022-08-15 ENCOUNTER — Encounter: Payer: Self-pay | Admitting: Internal Medicine

## 2022-08-15 ENCOUNTER — Ambulatory Visit (INDEPENDENT_AMBULATORY_CARE_PROVIDER_SITE_OTHER): Payer: Medicare Other | Admitting: Internal Medicine

## 2022-08-15 VITALS — BP 138/80 | HR 82 | Temp 99.3°F | Ht 70.0 in | Wt 191.0 lb

## 2022-08-15 DIAGNOSIS — R739 Hyperglycemia, unspecified: Secondary | ICD-10-CM

## 2022-08-15 DIAGNOSIS — M25561 Pain in right knee: Secondary | ICD-10-CM

## 2022-08-15 DIAGNOSIS — H6123 Impacted cerumen, bilateral: Secondary | ICD-10-CM | POA: Diagnosis not present

## 2022-08-15 DIAGNOSIS — H6120 Impacted cerumen, unspecified ear: Secondary | ICD-10-CM

## 2022-08-15 DIAGNOSIS — M542 Cervicalgia: Secondary | ICD-10-CM

## 2022-08-15 DIAGNOSIS — E559 Vitamin D deficiency, unspecified: Secondary | ICD-10-CM | POA: Diagnosis not present

## 2022-08-15 DIAGNOSIS — R931 Abnormal findings on diagnostic imaging of heart and coronary circulation: Secondary | ICD-10-CM | POA: Diagnosis not present

## 2022-08-15 DIAGNOSIS — E782 Mixed hyperlipidemia: Secondary | ICD-10-CM

## 2022-08-15 HISTORY — DX: Cervicalgia: M54.2

## 2022-08-15 HISTORY — DX: Pain in right knee: M25.561

## 2022-08-15 HISTORY — DX: Impacted cerumen, unspecified ear: H61.20

## 2022-08-15 LAB — LIPID PANEL
Cholesterol: 129 mg/dL (ref 0–200)
HDL: 45.4 mg/dL (ref >39.00)
LDL Cholesterol: 65 mg/dL (ref 0–99)
NonHDL: 83.2
Total CHOL/HDL Ratio: 3
Triglycerides: 91 mg/dL (ref 0.0–149.0)
VLDL: 18.2 mg/dL (ref 0.0–40.0)

## 2022-08-15 LAB — VITAMIN D 25 HYDROXY (VIT D DEFICIENCY, FRACTURES): VITD: 30.99 ng/mL (ref 30.00–100.00)

## 2022-08-15 LAB — HEPATIC FUNCTION PANEL
ALT: 12 U/L (ref 0–53)
AST: 15 U/L (ref 0–37)
Albumin: 3.9 g/dL (ref 3.5–5.2)
Alkaline Phosphatase: 84 U/L (ref 39–117)
Bilirubin, Direct: 0.2 mg/dL (ref 0.0–0.3)
Total Bilirubin: 0.5 mg/dL (ref 0.2–1.2)
Total Protein: 7.2 g/dL (ref 6.0–8.3)

## 2022-08-15 LAB — BASIC METABOLIC PANEL
BUN: 18 mg/dL (ref 6–23)
CO2: 28 mEq/L (ref 19–32)
Calcium: 9 mg/dL (ref 8.4–10.5)
Chloride: 103 mEq/L (ref 96–112)
Creatinine, Ser: 0.69 mg/dL (ref 0.40–1.50)
GFR: 94.45 mL/min (ref >60.00)
Glucose, Bld: 89 mg/dL (ref 70–99)
Potassium: 4 mEq/L (ref 3.5–5.1)
Sodium: 138 mEq/L (ref 135–145)

## 2022-08-15 LAB — HEMOGLOBIN A1C: Hgb A1c MFr Bld: 6.4 % (ref 4.6–6.5)

## 2022-08-15 NOTE — Assessment & Plan Note (Signed)
Lab Results  Component Value Date   HGBA1C 6.4 08/15/2022   Stable, pt to continue current medical treatment  - diet, wt control

## 2022-08-15 NOTE — Assessment & Plan Note (Signed)
Lab Results  Component Value Date   LDLCALC 65 08/15/2022   Stable, pt to continue current statin crestor 40 mg qd

## 2022-08-15 NOTE — Patient Instructions (Signed)
Your ears were irrigated of wax today  Ok to try the OTC Voltaren gel as needed for joint and neck pain  Please continue all other medications as before, and refills have been done if requested.  Please have the pharmacy call with any other refills you may need.  Please continue your efforts at being more active, low cholesterol diet, and weight control.  Please keep your appointments with your specialists as you may have planned  Please go to the LAB at the blood drawing area for the tests to be done  You will be contacted by phone if any changes need to be made immediately.  Otherwise, you will receive a letter about your results with an explanation, but please check with MyChart first.  Please remember to sign up for MyChart if you have not done so, as this will be important to you in the future with finding out test results, communicating by private email, and scheduling acute appointments online when needed.  Please make an Appointment to return in 6 months, or sooner if needed

## 2022-08-15 NOTE — Assessment & Plan Note (Signed)
Ceruminosis is noted bilateral.  Wax is removed by syringing and manual debridement. Instructions for home care to prevent wax buildup are given.

## 2022-08-15 NOTE — Progress Notes (Signed)
Patient ID: Russell Sampson, male   DOB: 1951/07/22, 71 y.o.   MRN: [id number]        Chief Complaint: follow up bilateral hearing reduced, low vit d, post neck and right knee pain after yardwork yesterday, hld, hyperglycemia, low vit d       HPI:  Russell Sampson is a 71 y.o. male here with wife with c/o over 1 wk worsening bilateral hearing muffled intermittent more on than off; but no fever, pain, HA, ST, cough.  Pt denies chest pain, increased sob or doe, wheezing, orthopnea, PND, increased LE swelling, palpitations, dizziness or syncope.   Pt denies polydipsia, polyuria, or new focal neuro s/s.  Pt denies fever, wt loss, night sweats, loss of appetite, or other constitutional symptoms  Does also c/o mild post lower neck pain and right knee mild pain with some limping today after working over 1 hr in the yard yesterday with lifting and bending.         Wt Readings from Last 3 Encounters:  08/15/22 191 lb (86.6 kg)  03/31/22 194 lb (88 kg)  03/18/22 194 lb (88 kg)   BP Readings from Last 3 Encounters:  08/15/22 138/80  03/18/22 (!) 150/75  03/06/22 130/66         Past Medical History:  Diagnosis Date   Arthritis of finger of left hand 07/08/2021   Benign essential tremor    Right-hand   Benign neoplasm of ascending colon    Benign neoplasm of cecum    Benign neoplasm of sigmoid colon    Benign neoplasm of transverse colon    Cataract    bilateral; removed   Chicken pox    Difficult airway for intubation 03/10/2019   Elbow pain 08/29/2013   Generalized anxiety disorder with panic attacks 01/30/2011   Greater trochanteric bursitis of right hip 03/21/2020   Heart murmur 12/14/2019   HLD (hyperlipidemia) 12/14/2019   Hyperglycemia 12/15/2020   Knee effusion, right 04/11/2020   Mild cognitive impairment of uncertain or unknown etiology    Personal history of colonic polyps 10/2007   3-4 small adenomas   Prostate cancer    Right lumbar radiculopathy 03/28/2020   Thumb tendonitis  07/21/2020   Vitamin D deficiency 02/13/2022   Past Surgical History:  Procedure Laterality Date   CLOSED REDUCTION FOREARM FRACTURE     1962   COLONOSCOPY     COLONOSCOPY W/ POLYPECTOMY  10/22/2007   3-4 adenomas, max 55m   COLONOSCOPY WITH PROPOFOL N/A 05/30/2019   Procedure: COLONOSCOPY WITH PROPOFOL;  Surgeon: GGatha Mayer MD;  Location: [facility];  Service: Endoscopy;  Laterality: N/A;   LYMPHADENECTOMY Bilateral 11/10/2016   Procedure: PELVIC LYMPHADENECTOMY;  Surgeon: LRaynelle Bring MD;  Location: [facility];  Service: Urology;  Laterality: Bilateral;   POLYPECTOMY     POLYPECTOMY  05/30/2019   Procedure: POLYPECTOMY;  Surgeon: GGatha Mayer MD;  Location: [facility];  Service: Endoscopy;;   PROSTATE BIOPSY     09/19/2016   ROBOT ASSISTED LAPAROSCOPIC RADICAL PROSTATECTOMY N/A 11/10/2016   Procedure: XI ROBOTIC ASSISTED LAPAROSCOPIC RADICAL PROSTATECTOMY LEVEL 2;  Surgeon: LRaynelle Bring MD;  Location: [facility];  Service: Urology;  Laterality: N/A;    reports that he has never smoked. He has never used smokeless tobacco. He reports that he does not drink alcohol and does not use drugs. family history includes Alzheimer's disease in his maternal aunt, mother, and another family member; Alzheimer's disease (age of onset: 521 in his brother;  Anxiety disorder in his sister; Prostate cancer in his father; Stroke in his mother. No Known Allergies Current Outpatient Medications on File Prior to Visit  Medication Sig Dispense Refill   acetaminophen (TYLENOL) 500 MG tablet Take 1,000 mg by mouth every 6 (six) hours as needed.     Ascorbic Acid (VITAMIN C) 100 MG tablet Take 100 mg by mouth daily.     aspirin EC 81 MG tablet Take 1 tablet (81 mg total) by mouth daily. Swallow whole. 30 tablet 12   b complex vitamins tablet Take 1 tablet by mouth daily.     memantine (NAMENDA) 10 MG tablet Take 1 tablet  twice a day 60 tablet 11   Menthol, Topical Analgesic, (BENGAY EX) Apply 1  application topically daily as needed (muscle pain).     rosuvastatin (CRESTOR) 40 MG tablet Take 1 tablet (40 mg total) by mouth daily. 90 tablet 3   sertraline (ZOLOFT) 50 MG tablet Take 2 tablets (100 mg total) by mouth daily. 180 tablet 3   VITAMIN D, CHOLECALCIFEROL, PO Take 2,000 mg by mouth.     naproxen (NAPROSYN) 500 MG tablet Take 1 tablet (500 mg total) by mouth 2 (two) times daily as needed. (Patient not taking: Reported on 05/28/2022) 180 tablet 0   No current facility-administered medications on file prior to visit.        ROS:  All others reviewed and negative.  Objective        PE:  BP 138/80 (BP Location: Left Arm, Patient Position: Sitting, Cuff Size: Large)   Pulse 82   Temp 99.3 F (37.4 C) (Oral)   Ht '5\' 10"'$  (1.778 m)   Wt 191 lb (86.6 kg)   SpO2 95%   BMI 27.41 kg/m                 Constitutional: Pt appears in NAD               HENT: Head: NCAT.                Right Ear: External ear normal.                 Left Ear: External ear normal.  Bilateral cerumen resolved with irrigation               Eyes: . Pupils are equal, round, and reactive to light. Conjunctivae and EOM are normal               Nose: without d/c or deformity               Neck: Neck supple. Gross normal ROM               Cardiovascular: Normal rate and regular rhythm.                 Pulmonary/Chest: Effort normal and breath sounds without rales or wheezing.                Neck - post discrete area tenderness to lowest cervical without rash or swelling;  right knee with no effusion or other swelling, FROM               Neurological: Pt is alert. At baseline orientation, motor grossly intact               Skin: Skin is warm. No rashes, no other new lesions, LE edema - none  Psychiatric: Pt behavior is normal without agitation   Micro: none  Cardiac tracings I have personally interpreted today:  none  Pertinent Radiological findings (summarize): none   Lab Results   Component Value Date   WBC 7.1 12/30/2021   HGB 13.1 12/30/2021   HCT 39.5 12/30/2021   PLT 245.0 12/30/2021   GLUCOSE 89 08/15/2022   CHOL 129 08/15/2022   TRIG 91.0 08/15/2022   HDL 45.40 08/15/2022   LDLCALC 65 08/15/2022   ALT 12 08/15/2022   AST 15 08/15/2022   NA 138 08/15/2022   K 4.0 08/15/2022   CL 103 08/15/2022   CREATININE 0.69 08/15/2022   BUN 18 08/15/2022   CO2 28 08/15/2022   TSH 2.81 12/30/2021   PSA 0.00 (L) 12/30/2021   HGBA1C 6.4 08/15/2022   Assessment/Plan:  KAULANA BRINDLE is a 71 y.o. White or Caucasian [1] male with  has a past medical history of Arthritis of finger of left hand (07/08/2021), Benign essential tremor, Benign neoplasm of ascending colon, Benign neoplasm of cecum, Benign neoplasm of sigmoid colon, Benign neoplasm of transverse colon, Cataract, Chicken pox, Difficult airway for intubation (03/10/2019), Elbow pain (08/29/2013), Generalized anxiety disorder with panic attacks (01/30/2011), Greater trochanteric bursitis of right hip (03/21/2020), Heart murmur (12/14/2019), HLD (hyperlipidemia) (12/14/2019), Hyperglycemia (12/15/2020), Knee effusion, right (04/11/2020), Mild cognitive impairment of uncertain or unknown etiology, Personal history of colonic polyps (10/2007), Prostate cancer, Right lumbar radiculopathy (03/28/2020), Thumb tendonitis (07/21/2020), and Vitamin D deficiency (02/13/2022).  Vitamin D deficiency Last vitamin D Lab Results  Component Value Date   VD25OH 31.48 12/30/2021   Low, to start oral replacement'  HLD (hyperlipidemia) Lab Results  Component Value Date   LDLCALC 65 08/15/2022   Stable, pt to continue current statin crestor 40 mg qd   Hyperglycemia Lab Results  Component Value Date   HGBA1C 6.4 08/15/2022   Stable, pt to continue current medical treatment  - diet, wt control   Cerumen impaction Ceruminosis is noted bilateral.  Wax is removed by syringing and manual debridement. Instructions for home  care to prevent wax buildup are given.   Posterior neck pain Mild, exam benign, Pt for topical volt gel prn  Right knee pain Seems c/w msk strain, Mild, exam benign, Pt for topical volt gel prn  Followup: Return in about 6 months (around 02/14/2023).  [name], MD 08/15/2022 1:15 PM [facility]

## 2022-08-15 NOTE — Assessment & Plan Note (Signed)
Seems c/w msk strain, Mild, exam benign, Pt for topical volt gel prn

## 2022-08-15 NOTE — Assessment & Plan Note (Signed)
Last vitamin D Lab Results  Component Value Date   VD25OH 31.48 12/30/2021   Low, to start oral replacement'

## 2022-08-15 NOTE — Addendum Note (Signed)
Addended by: Max Sane on: 08/15/2022 04:23 PM   Modules accepted: Orders

## 2022-08-15 NOTE — Assessment & Plan Note (Signed)
Mild, exam benign, Pt for topical volt gel prn

## 2022-08-20 DIAGNOSIS — M25561 Pain in right knee: Secondary | ICD-10-CM | POA: Diagnosis not present

## 2022-09-02 DIAGNOSIS — M25561 Pain in right knee: Secondary | ICD-10-CM | POA: Diagnosis not present

## 2022-09-17 DIAGNOSIS — C61 Malignant neoplasm of prostate: Secondary | ICD-10-CM | POA: Diagnosis not present

## 2022-09-18 ENCOUNTER — Encounter: Payer: Self-pay | Admitting: Physician Assistant

## 2022-09-18 ENCOUNTER — Ambulatory Visit (INDEPENDENT_AMBULATORY_CARE_PROVIDER_SITE_OTHER): Payer: Medicare Other | Admitting: Physician Assistant

## 2022-09-18 VITALS — BP 144/66 | HR 106 | Resp 18 | Ht 70.0 in | Wt 193.0 lb

## 2022-09-18 DIAGNOSIS — G3184 Mild cognitive impairment, so stated: Secondary | ICD-10-CM | POA: Diagnosis not present

## 2022-09-18 NOTE — Progress Notes (Signed)
Assessment/Plan:   Mild cognitive impairment of unknown etiology  Russell Sampson is a very pleasant 72 y.o. RH male with a history of severe anxiety, hypertension, hyperlipidemia, GAD, and mild cognitive impairment of unclear etiology per Neuropsych evaluation, vitamin D deficiency, arthritis, seen today in follow up for memory loss. Patient is currently on memantine 5 mg twice daily.  December 2022 MRI brain personally reviewed was remarkable for unchanged minimal chronic small vessel ischemic disease and mild cerebral atrophy.  No acute findings.Patient was on behavioral therapy for generalized anxiety disorder, but discontinued when he realized that deficit of bee sting hand burn noted the feet.  He has not been on therapy, which is highly recommended, given recent event (his brother just died).  MMSE today is 28/30, memory is stable    Follow up in 6 months. Continue to control mood by PCP for meds, anxiety is still not under control, patient has been recommended to continue behavioral therapy, for close management of generalized anxiety disorder Continue memantine 10  mg twice daily. Patient is scheduled for repeat Neuropsych evaluation in July 2024 for clarity of the diagnosis and disease trajectory Recommend good control of cardiovascular risk factors     Subjective:    This patient is accompanied in the office by his wife who supplements the history.  Previous records as well as any outside records available were reviewed prior to todays visit. Patient was last seen on 03/18/2002.  Last MoCA on 07/25/2021 was 28/30.   Any changes in memory since last visit?   His wife reports that his memory "may be worse but his brother just died and broke more anxiety " Patient may have  some difficulty remembering recent conversations and people name. repeats oneself?  Endorsed "a lot "-wife says. Disoriented when walking into a room?  Patient denies except occasionally not remembering what  patient came to the room for   Leaving objects in unusual places?    denies   Wandering behavior?  denies   Any personality changes since last visit?  denies   Any worsening depression?:  Endorsed, "he  tried and did not feel it was a good fit. He is looking another therapist, I think is going to try the one online " Hallucinations or paranoia?  denies   Seizures?    denies    Any sleep changes?  Denies vivid dreams, REM behavior or sleepwalking   Sleep apnea?   denies   Any hygiene concerns?    denies   Independent of bathing and dressing?  Endorsed  Does the patient needs help with medications?  Wife is in charge  Who is in charge of the finances?  Patient is in charge    Any changes in appetite?  denies but he craves snacks when anxious . Drinking plenty water  Patient have trouble swallowing?  denies   Does the patient cook? No  Any headaches?   denies   Chronic back pain  denies   Ambulates with difficulty?  He has chronic knee pain.  He is going back to increasing physical activities Recent falls or head injuries? denies     Unilateral weakness, numbness or tingling?    denies   Any tremors?  denies  ET for years Any anosmia?  Patient denies   Any incontinence of urine?  denies   Any bowel dysfunction?     denies      Patient lives  with wife  Does the patient drive? Drives well  especially his [place] which he likes to show of the neighbors according to wife.   Neurocognitive testing July 2023, Dr. Melvyn Novas . Briefly, results suggested a primary impairment surrounding both encoding (i.e., learning) and retrieval aspects of verbal memory. A relative weakness was also exhibited across phonemic fluency, while performance variability was exhibited across processing speed, executive functioning, and recognition/consolidation aspects of verbal memory. Across three verbal memory measures, he did demonstrate a weakness learning new information efficiently. Across list and daily living  tasks, he was fully amnestic (i.e., 0% retention) after a brief delay and performances were variable across yes/no recognition trials. Across a third story task, his delayed retrieval was also impaired; however, he was able to recall 67% of previously learned information. Taken together, this could suggest very early evidence for rapid forgetting and an evolving storage impairment, which are hallmark characteristics of this illness. Put more plainly, there is the potential that day-to-day instances of Russell Sampson repeating himself and asking repetitive questions, as well as performance across testing, is due to something above and beyond day-to-day anxiety. This will need to be closely monitored over time. It is important to highlight that visual memory was appropriate and non-memory areas commonly affected early on in Alzheimer's disease (i.e., semantic fluency, confrontation naming, visuospatial abilities) remained strong.      History of Present Illness 2020 This is a very pleasant 72 year old right-handed man with a history of prostate cancer, anxiety, presenting for evaluation of memory concerns. He started becoming concerned due to his brother's diagnosis of early onset Alzheimer's dementia at age 27. His mother was diagnosed with dementia at age 71 and had it for 7 years until she passed away 13 years ago. He has 2 maternal aunts and a maternal cousin with dementia. This has caused him anxiety that he would develop dementia, he states right now his memory is pretty good and he has not noticed any memory changes himself. His wife has noticed that over the past couple of years, she would tell the same thing repeatedly, he says he is just trying to make a point. He drives without getting lost, his wife has noticed that sometimes he does not pay attention and she would notice he would be drifting or the car behind them honks when the light changes. He manages bills and medications without difficulties. He  misplaces things and would "totally freak out about it" per wife. He has always been this way, but it is more than it used to be, now occurring at least once a week. She states that his personality has always been a very anxious individual, he is a very negative individual, but over the past few months, this has worsened. He watches the 10pm news nightly and repeats twice during the visit that he has cut down on this. His wife states he thinks the world is a terrible place and he is adamant he goes with her when she goes out because someone may do something. He is not necessarily paranoid per wife, no hallucinations. She states he does not think the anxiety is a problem. She does not think the Prozac is helping.    He denies any headaches, dizziness, vision changes, dysarthria/dysphagia, neck/back pain, focal numbness/tingling/weakness, bowel/bladder dysfunction, anosmia. He denies any tremors, his wife reminds him he has occasional right hand shaking that they had checked out several years ago, diagnosed as benign essential tremor. It does not affect writing or using utensils. He denies any falls. Sleep is good.  No history of concussions or alcohol use. 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 He feels he was forced into early retirement. He does work around [employer] and with old classic cars, he helps care for his brother with dementia.    PREVIOUS MEDICATIONS:   CURRENT MEDICATIONS:  Outpatient Encounter Medications as of 09/18/2022  Medication Sig   acetaminophen (TYLENOL) 500 MG tablet Take 1,000 mg by mouth every 6 (six) hours as needed.   Ascorbic Acid (VITAMIN C) 100 MG tablet Take 100 mg by mouth daily.   aspirin EC 81 MG tablet Take 1 tablet (81 mg total) by mouth daily. Swallow whole.   b complex vitamins tablet Take 1 tablet by mouth daily.   memantine (NAMENDA) 10 MG tablet Take 1 tablet  twice a day   Menthol,  Topical Analgesic, (BENGAY EX) Apply 1 application topically daily as needed (muscle pain).   naproxen (NAPROSYN) 500 MG tablet Take 1 tablet (500 mg total) by mouth 2 (two) times daily as needed.   rosuvastatin (CRESTOR) 40 MG tablet Take 1 tablet (40 mg total) by mouth daily.   sertraline (ZOLOFT) 50 MG tablet Take 2 tablets (100 mg total) by mouth daily.   VITAMIN D, CHOLECALCIFEROL, PO Take 2,000 mg by mouth.   No facility-administered encounter medications on file as of 09/18/2022.       09/18/2022   12:00 PM  MMSE - Mini Mental State Exam  Orientation to time 5  Orientation to Place 5  Registration 3  Attention/ Calculation 5  Recall 1  Language- name 2 objects 2  Language- repeat 1  Language- follow 3 step command 3  Language- read & follow direction 1  Write a sentence 1  Copy design 1  Total score 28      02/07/2019    3:00 PM  Montreal Cognitive Assessment   Visuospatial/ Executive (0/5) 4  Naming (0/3) 3  Attention: Read list of digits (0/2) 2  Attention: Read list of letters (0/1) 1  Attention: Serial 7 subtraction starting at 100 (0/3) 3  Language: Repeat phrase (0/2) 1  Language : Fluency (0/1) 1  Abstraction (0/2) 0  Delayed Recall (0/5) 5  Orientation (0/6) 6  Total 26  Adjusted Score (based on education) 26    Objective:     PHYSICAL EXAMINATION:    VITALS:   Vitals:   09/18/22 0928  BP: (!) 144/66  Pulse: (!) 106  Resp: 18  SpO2: 98%  Weight: 193 lb (87.5 kg)  Height: '5\' 10"'$  (1.778 m)    GEN:  The patient appears stated age and is in NAD. HEENT:  Normocephalic, atraumatic.   Neurological examination:  General: NAD, well-groomed, appears stated age.  Very anxious appearing Orientation: The patient is alert. Oriented to person, place and date Cranial nerves: There is good facial symmetry.The speech is fluent and clear. No aphasia or dysarthria. Fund of knowledge is appropriate. Recent memory impaired, remote memory normal attention and  concentration are normal.  Able to name objects and repeat phrases.  Hearing is intact to conversational tone.    Sensation: Sensation is intact to light touch throughout Motor: Strength is at least antigravity x4. DTR's 2/4 in UE/LE     Movement examination: Tone: There is normal tone in the UE/LE no cogwheeling is noted Abnormal movements: He has known mild right essential tremor, stable from prior visit.  No myoclonus.  No asterixis.   Coordination:  There is no decremation with RAM's. Normal finger to nose  Gait and Station: The patient has no difficulty arising out of a deep-seated chair without the use of the hands. The patient's stride length is good.  Gait is cautious and narrow.    Thank you for allowing [place] the opportunity to participate in the care of this nice patient. Please do not hesitate to contact us for any questions or concerns.   Total time spent on today's visit was 30 minutes dedicated to this patient today, preparing to see patient, examining the patient, ordering tests and/or medications and counseling the patient, documenting clinical information in the EHR or other health record, independently interpreting results and communicating results to the patient/family, discussing treatment and goals, answering patient's questions and coordinating care.  Cc:  Biagio Borg, MD  Sharene Butters 09/18/2022 12:30 PM

## 2022-09-18 NOTE — Patient Instructions (Signed)
It was a pleasure to see you today at our office.   Recommendations:  Follow up in 6 months  Continue memantine 10 mg twice daily  Recommend strongly to seek  Cognitive Behavioral Therapy   RECOMMENDATIONS FOR ALL PATIENTS WITH MEMORY PROBLEMS: 1. Continue to exercise (Recommend 30 minutes of walking everyday, or 3 hours every week) 2. Increase social interactions - continue going to [place] and enjoy social gatherings with friends and family 3. Eat healthy, avoid fried foods and eat more fruits and vegetables 4. Maintain adequate blood pressure, blood sugar, and blood cholesterol level. Reducing the risk of stroke and cardiovascular disease also helps promoting better memory. 5. Avoid stressful situations. Live a simple life and avoid aggravations. Organize your time and prepare for the next day in anticipation. 6. Sleep well, avoid any interruptions of sleep and avoid any distractions in the bedroom that may interfere with adequate sleep quality 7. Avoid sugar, avoid sweets as there is a strong link between excessive sugar intake, diabetes, and cognitive impairment We discussed the Mediterranean diet, which has been shown to help patients reduce the risk of progressive memory disorders and reduces cardiovascular risk. This includes eating fish, eat fruits and green leafy vegetables, nuts like almonds and hazelnuts, walnuts, and also use olive oil. Avoid fast foods and fried foods as much as possible. Avoid sweets and sugar as sugar use has been linked to worsening of memory function.  There is always a concern of gradual progression of memory problems. If this is the case, then we may need to adjust level of care according to patient needs. Support, both to the patient and caregiver, should then be put into place.      You have been referred for a neuropsychological evaluation (i.e., evaluation of memory and thinking abilities). Please bring someone with you to this appointment if possible,  as it is helpful for the doctor to hear from both you and another adult who knows you well. Please bring eyeglasses and hearing aids if you wear them.    The evaluation will take approximately 3 hours and has two parts:   The first part is a clinical interview with the neuropsychologist (Dr. Melvyn Novas or Dr. Nicole Kindred). During the interview, the neuropsychologist will speak with you and the individual you brought to the appointment.    The second part of the evaluation is testing with the doctor's technician Hinton Dyer or Maudie Mercury). During the testing, the technician will ask you to remember different types of material, solve problems, and answer some questionnaires. Your family member will not be present for this portion of the evaluation.   Please note: We must reserve several hours of the neuropsychologist's time and the psychometrician's time for your evaluation appointment. As such, there is a No-Show fee of $100. If you are unable to attend any of your appointments, please contact our office as soon as possible to reschedule.    FALL PRECAUTIONS: Be cautious when walking. Scan the area for obstacles that may increase the risk of trips and falls. When getting up in the mornings, sit up at the edge of the bed for a few minutes before getting out of bed. Consider elevating the bed at the head end to avoid drop of blood pressure when getting up. Walk always in a well-lit room (use night lights in the walls). Avoid area rugs or power cords from appliances in the middle of the walkways. Use a walker or a cane if necessary and consider physical therapy for balance  exercise. Get your eyesight checked regularly.  FINANCIAL OVERSIGHT: Supervision, especially oversight when making financial decisions or transactions is also recommended.  HOME SAFETY: Consider the safety of the kitchen when operating appliances like stoves, microwave oven, and blender. Consider having supervision and share cooking responsibilities until no  longer able to participate in those. Accidents with firearms and other hazards in the house should be identified and addressed as well.   ABILITY TO BE LEFT ALONE: If patient is unable to contact 911 operator, consider using LifeLine, or when the need is there, arrange for someone to stay with patients. Smoking is a fire hazard, consider supervision or cessation. Risk of wandering should be assessed by caregiver and if detected at any point, supervision and safe proof recommendations should be instituted.  MEDICATION SUPERVISION: Inability to self-administer medication needs to be constantly addressed. Implement a mechanism to ensure safe administration of the medications.   DRIVING: Regarding driving, in patients with progressive memory problems, driving will be impaired. We advise to have someone else do the driving if trouble finding directions or if minor accidents are reported. Independent driving assessment is available to determine safety of driving.   If you are interested in the driving assessment, you can contact the following:  [employer] in [place]  [place] [phone] or [phone]    Elizabeth refers to food and lifestyle choices that are based on the traditions of countries located on the [employer]. This way of eating has been shown to help prevent certain conditions and improve outcomes for people who have chronic diseases, like kidney disease and heart disease. What are tips for following this plan? Lifestyle  Cook and eat meals together with your family, when possible. Drink enough fluid to keep your urine clear or pale yellow. Be physically active every day. This includes: Aerobic exercise like running or swimming. Leisure activities like gardening, walking, or housework. Get 7-8 hours of sleep each  night. If recommended by your health care provider, drink red wine in moderation. This means 1 glass a day for nonpregnant women and 2 glasses a day for men. A glass of wine equals 5 oz (150 mL). Reading food labels  Check the serving size of packaged foods. For foods such as rice and pasta, the serving size refers to the amount of cooked product, not dry. Check the total fat in packaged foods. Avoid foods that have saturated fat or trans fats. Check the ingredients list for added sugars, such as corn syrup. Shopping  At the grocery store, buy most of your food from the areas near the walls of the store. This includes: Fresh fruits and vegetables (produce). Grains, beans, nuts, and seeds. Some of these may be available in unpackaged forms or large amounts (in bulk). Fresh seafood. Poultry and eggs. Low-fat dairy products. Buy whole ingredients instead of prepackaged foods. Buy fresh fruits and vegetables in-season from local farmers markets. Buy frozen fruits and vegetables in resealable bags. If you do not have access to quality fresh seafood, buy precooked frozen shrimp or canned fish, such as tuna, salmon, or sardines. Buy small amounts of raw or cooked vegetables, salads, or olives from the deli or salad bar at your store. Stock your pantry so you always have certain foods on hand, such as olive oil, canned tuna, canned tomatoes, rice, pasta, and beans. Cooking  Cook foods with extra-virgin olive oil instead of using  butter or other vegetable oils. Have meat as a side dish, and have vegetables or grains as your main dish. This means having meat in small portions or adding small amounts of meat to foods like pasta or stew. Use beans or vegetables instead of meat in common dishes like chili or lasagna. Experiment with different cooking methods. Try roasting or broiling vegetables instead of steaming or sauteing them. Add frozen vegetables to soups, stews, pasta, or rice. Add nuts or seeds  for added healthy fat at each meal. You can add these to yogurt, salads, or vegetable dishes. Marinate fish or vegetables using olive oil, lemon juice, garlic, and fresh herbs. Meal planning  Plan to eat 1 vegetarian meal one day each week. Try to work up to 2 vegetarian meals, if possible. Eat seafood 2 or more times a week. Have healthy snacks readily available, such as: Vegetable sticks with hummus. Greek yogurt. Fruit and nut trail mix. Eat balanced meals throughout the week. This includes: Fruit: 2-3 servings a day Vegetables: 4-5 servings a day Low-fat dairy: 2 servings a day Fish, poultry, or lean meat: 1 serving a day Beans and legumes: 2 or more servings a week Nuts and seeds: 1-2 servings a day Whole grains: 6-8 servings a day Extra-virgin olive oil: 3-4 servings a day Limit red meat and sweets to only a few servings a month What are my food choices? Mediterranean diet Recommended Grains: Whole-grain pasta. Brown rice. Bulgar wheat. Polenta. Couscous. Whole-wheat bread. [place]. Vegetables: Artichokes. Beets. Broccoli. Cabbage. Carrots. Eggplant. Green beans. Chard. Kale. Spinach. Onions. Leeks. Peas. Squash. Tomatoes. Peppers. Radishes. Fruits: Apples. Apricots. Avocado. Berries. Bananas. Cherries. Dates. Figs. Grapes. Lemons. Melon. Oranges. Peaches. Plums. Pomegranate. Meats and other protein foods: Beans. Almonds. Sunflower seeds. Pine nuts. Peanuts. [place]. Salmon. Scallops. Shrimp. [place]. Tilapia. Clams. Oysters. Eggs. Dairy: Low-fat milk. Cheese. Greek yogurt. Beverages: Water. Red wine. Herbal tea. Fats and oils: Extra virgin olive oil. Avocado oil. Grape seed oil. Sweets and desserts: [place] yogurt with honey. Baked apples. Poached pears. Trail mix. Seasoning and other foods: Basil. Cilantro. Coriander. Cumin. Mint. Parsley. Sage. Rosemary. Tarragon. Garlic. Oregano. Thyme. Pepper. Balsalmic vinegar. Tahini. Hummus. Tomato sauce. Olives. Mushrooms. Limit  these Grains: Prepackaged pasta or rice dishes. Prepackaged cereal with added sugar. Vegetables: Deep fried potatoes (french fries). Fruits: Fruit canned in syrup. Meats and other protein foods: Beef. Pork. Lamb. Poultry with skin. Hot dogs. Berniece Salines. Dairy: Ice cream. Sour cream. Whole milk. Beverages: Juice. Sugar-sweetened soft drinks. Beer. Liquor and spirits. Fats and oils: Butter. Canola oil. Vegetable oil. Beef fat (tallow). Lard. Sweets and desserts: Cookies. Cakes. Pies. Candy. Seasoning and other foods: Mayonnaise. Premade sauces and marinades. The items listed may not be a complete list. Talk with your dietitian about what dietary choices are right for you. Summary The Mediterranean diet includes both food and lifestyle choices. Eat a variety of fresh fruits and vegetables, beans, nuts, seeds, and whole grains. Limit the amount of red meat and sweets that you eat. Talk with your health care provider about whether it is safe for you to drink red wine in moderation. This means 1 glass a day for nonpregnant women and 2 glasses a day for men. A glass of wine equals 5 oz (150 mL). This information is not intended to replace advice given to you by your health care provider. Make sure you discuss any questions you have with your health care provider. Document Released: 03/27/2016 Document Revised: 04/29/2016 Document Reviewed: 03/27/2016 Elsevier Interactive Patient Education  2017 Elsevier Inc.  We have sent a referral to [facility] for your MRI and they will call you directly to schedule your appointment. They are located at [place]. If you need to contact them directly please call [phone].

## 2022-09-24 DIAGNOSIS — C61 Malignant neoplasm of prostate: Secondary | ICD-10-CM | POA: Diagnosis not present

## 2022-09-24 DIAGNOSIS — N393 Stress incontinence (female) (male): Secondary | ICD-10-CM | POA: Diagnosis not present

## 2022-10-21 DIAGNOSIS — M1711 Unilateral primary osteoarthritis, right knee: Secondary | ICD-10-CM | POA: Diagnosis not present

## 2022-10-21 DIAGNOSIS — M25461 Effusion, right knee: Secondary | ICD-10-CM | POA: Diagnosis not present

## 2022-10-21 DIAGNOSIS — M25469 Effusion, unspecified knee: Secondary | ICD-10-CM | POA: Diagnosis not present

## 2022-10-21 DIAGNOSIS — M25561 Pain in right knee: Secondary | ICD-10-CM | POA: Diagnosis not present

## 2022-11-04 DIAGNOSIS — M25561 Pain in right knee: Secondary | ICD-10-CM | POA: Diagnosis not present

## 2022-11-04 DIAGNOSIS — M25461 Effusion, right knee: Secondary | ICD-10-CM | POA: Diagnosis not present

## 2022-11-07 DIAGNOSIS — L814 Other melanin hyperpigmentation: Secondary | ICD-10-CM | POA: Diagnosis not present

## 2022-11-07 DIAGNOSIS — D225 Melanocytic nevi of trunk: Secondary | ICD-10-CM | POA: Diagnosis not present

## 2022-11-07 DIAGNOSIS — L821 Other seborrheic keratosis: Secondary | ICD-10-CM | POA: Diagnosis not present

## 2022-11-07 DIAGNOSIS — L570 Actinic keratosis: Secondary | ICD-10-CM | POA: Diagnosis not present

## 2022-11-17 DIAGNOSIS — M545 Low back pain, unspecified: Secondary | ICD-10-CM

## 2022-11-17 HISTORY — DX: Low back pain, unspecified: M54.50

## 2022-12-01 IMAGING — MR MR HEAD W/O CM
10 series · 48 of 48 positions shown · non-contrast
Comparison: Head MRI 09/03/2018

CLINICAL DATA: Memory loss.  Family history of Alzheimer's disease.

EXAM:
MRI HEAD WITHOUT CONTRAST
TECHNIQUE: Multiplanar, multiecho pulse sequences of the brain and surrounding
structures were obtained without intravenous contrast.

[Series 2: T1 · sagittal · 5.0mm · 0.53mm/px · 3 of 25 slices shown]
[im 1/25]
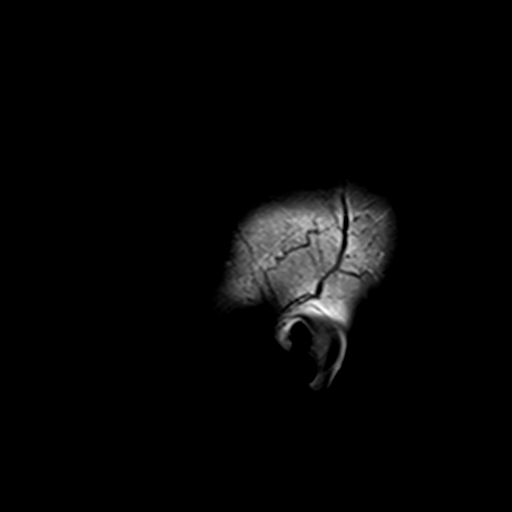
[im 13/25]
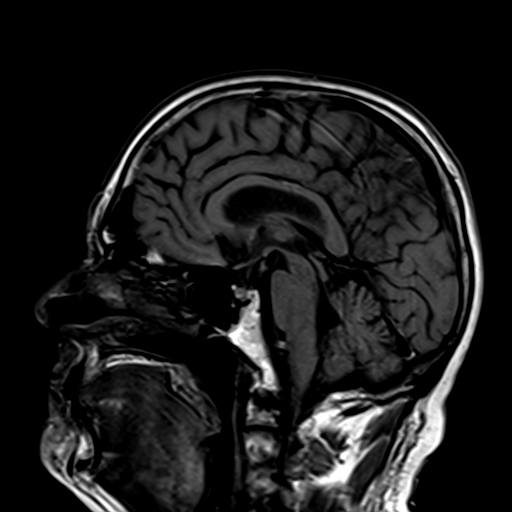
[im 25/25]
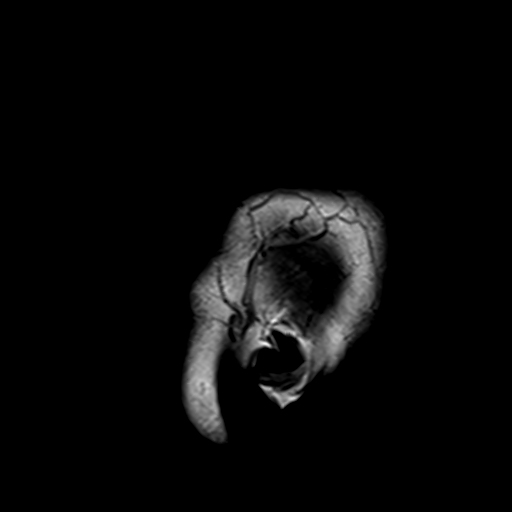

[Series 3: ax ep2d_diff_3 · axial · 3.0mm · 2.03mm/px · z∈[-41,+129]mm · 9 of 114 slices shown]
[im 1/114]
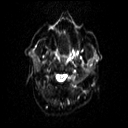
[im 15/114]
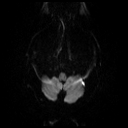
[im 29/114]
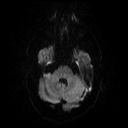
[im 43/114]
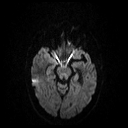
[im 57/114]
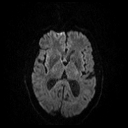
[im 71/114]
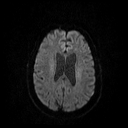
[im 85/114]
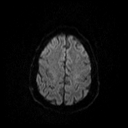
[im 99/114]
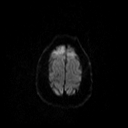
[im 114/114]
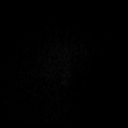

[Series 4: ax ep2d_diff_3_adc · axial · 3.0mm · 2.03mm/px · z∈[-41,+129]mm · 4 of 59 slices shown]
[im 1/59]
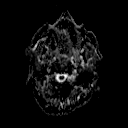
[im 20/59]
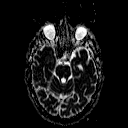
[im 39/59]
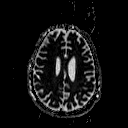
[im 59/59]
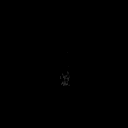

[Series 5: cor ep2d_diff · coronal · 5.0mm · 1.77mm/px · 4 of 60 slices shown]
[im 1/60]
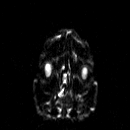
[im 20/60]
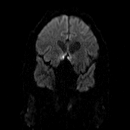
[im 40/60]
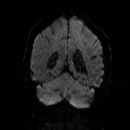
[im 60/60]
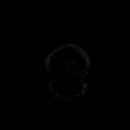

[Series 6: cor ep2d_diff_adc · coronal · 5.0mm · 1.77mm/px · 2 of 30 slices shown]
[im 1/30]
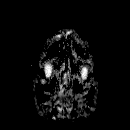
[im 30/30]
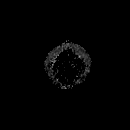

[Series 8: swi_images · axial · 2.0mm · 1.02mm/px · z∈[-42,+129]mm · 6 of 88 slices shown]
[im 1/88]
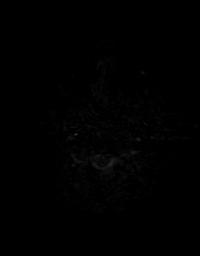
[im 18/88]
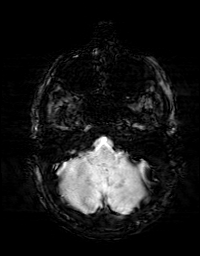
[im 35/88]
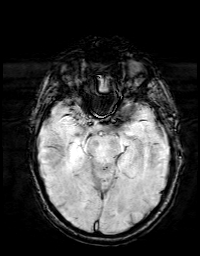
[im 53/88]
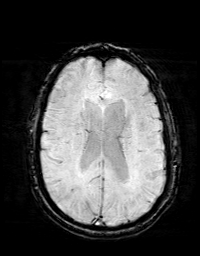
[im 70/88]
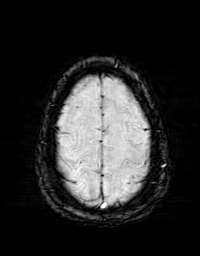
[im 88/88]
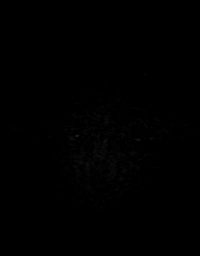

[Series 9: FLAIR · axial · 3.0mm · 0.51mm/px · z∈[-36,+129]mm · 3 of 44 slices shown]
[im 1/44]
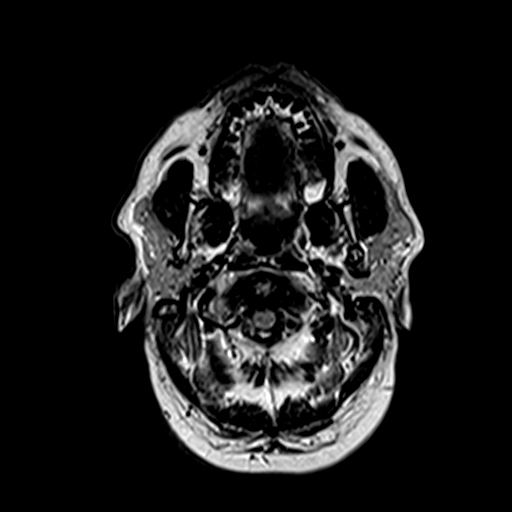
[im 22/44]
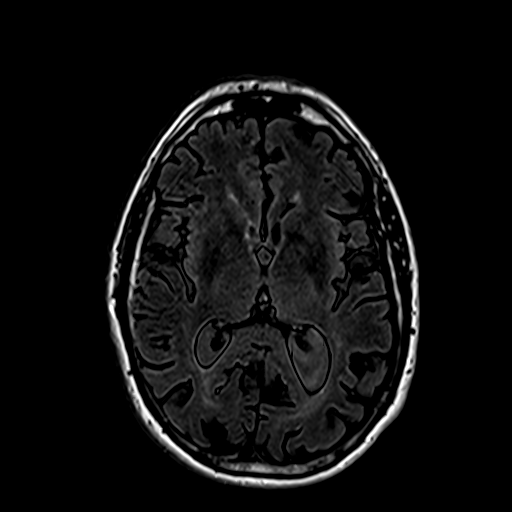
[im 44/44]
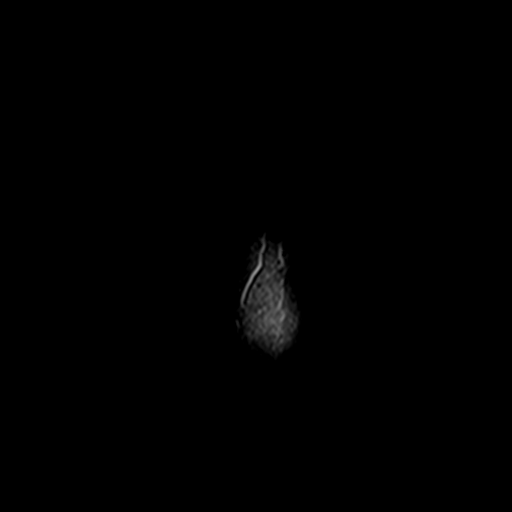

[Series 10: T2 · axial · 5.0mm · 0.68mm/px · z∈[-42,+129]mm · 2 of 30 slices shown (1 of 2)]
[im 1/30]
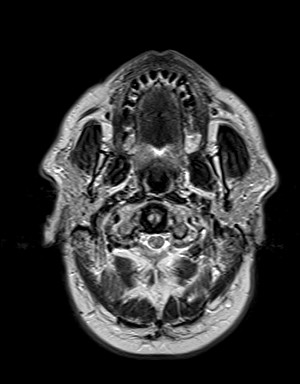
[im 30/30]
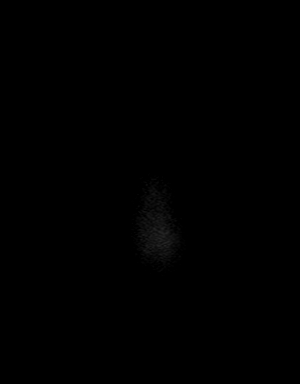

[Series 11: t1_mpr_tra · axial · 1.0mm · 0.84mm/px · z∈[-41,+131]mm · 13 of 176 slices shown]
[im 1/176]
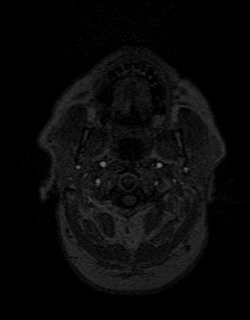
[im 15/176]
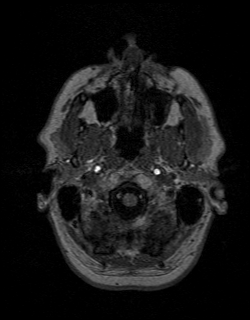
[im 30/176]
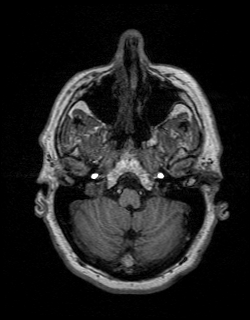
[im 44/176]
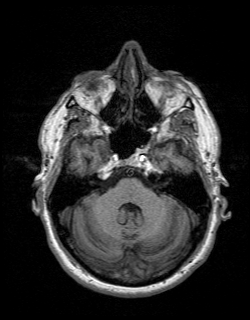
[im 59/176]
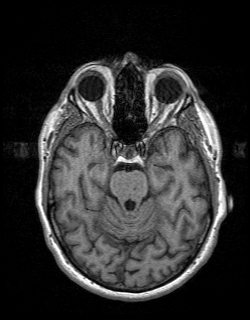
[im 73/176]
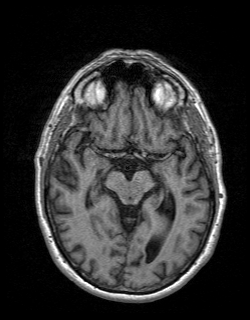
[im 88/176]
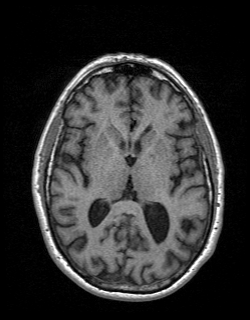
[im 103/176]
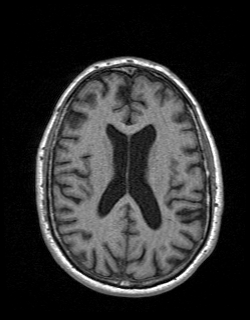
[im 117/176]
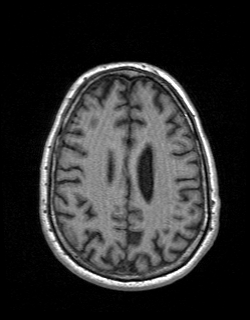
[im 132/176]
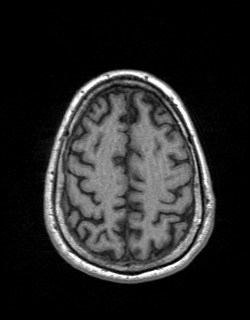
[im 146/176]
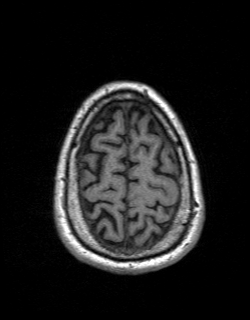
[im 161/176]
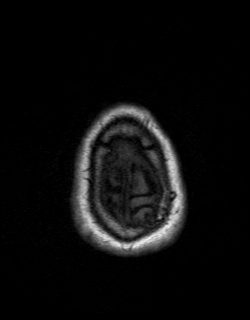
[im 176/176]
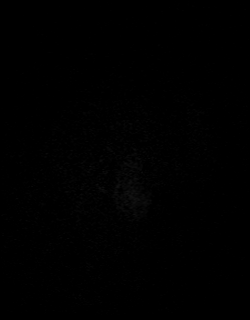

[Series 12: T2 · coronal · 5.0mm · 0.43mm/px · 2 of 31 slices shown (2 of 2)]
[im 1/31]
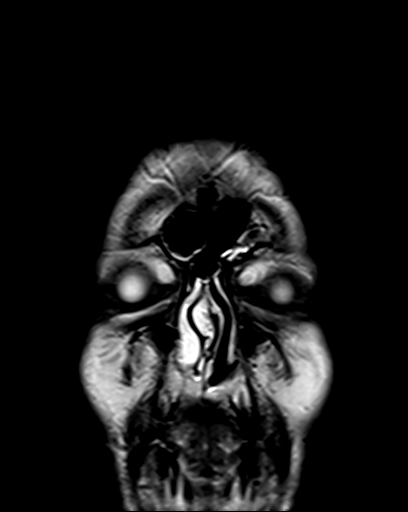
[im 31/31]
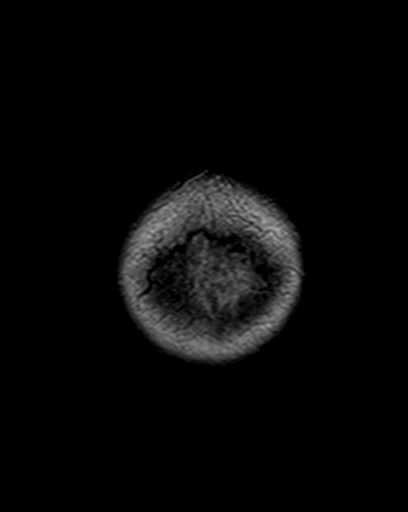

[48 of 48 positions shown; findings below may reference images not displayed]

FINDINGS: Brain: There is no evidence of an acute infarct, intracranial
hemorrhage, mass, midline shift, or extra-axial fluid collection.
Small T2 hyperintensities in the cerebral white matter bilaterally
are unchanged and nonspecific but compatible with minimal chronic
small vessel ischemic disease. Mild cerebral atrophy is unchanged
and without lobar predominance.

Vascular: Major intracranial vascular flow voids are preserved.

Skull and upper cervical spine: Unremarkable bone marrow signal para

Sinuses/Orbits: Bilateral cataract extraction. Minimal mucosal
thickening in the paranasal sinuses. Clear mastoid air cells.

Other: None.
IMPRESSION: 1. No acute intracranial abnormality.
2. Unchanged minimal chronic small vessel ischemic disease.
3.  Mild cerebral atrophy ([id number]).

## 2022-12-02 ENCOUNTER — Encounter: Payer: Self-pay | Admitting: Internal Medicine

## 2022-12-02 ENCOUNTER — Ambulatory Visit (INDEPENDENT_AMBULATORY_CARE_PROVIDER_SITE_OTHER): Payer: Medicare Other | Admitting: Internal Medicine

## 2022-12-02 VITALS — BP 120/72 | HR 80 | Temp 98.1°F | Ht 70.0 in | Wt 198.0 lb

## 2022-12-02 DIAGNOSIS — E559 Vitamin D deficiency, unspecified: Secondary | ICD-10-CM | POA: Diagnosis not present

## 2022-12-02 DIAGNOSIS — F411 Generalized anxiety disorder: Secondary | ICD-10-CM | POA: Diagnosis not present

## 2022-12-02 DIAGNOSIS — R739 Hyperglycemia, unspecified: Secondary | ICD-10-CM | POA: Diagnosis not present

## 2022-12-02 DIAGNOSIS — F410 Panic disorder [episodic paroxysmal anxiety] without agoraphobia: Secondary | ICD-10-CM | POA: Diagnosis not present

## 2022-12-02 DIAGNOSIS — E782 Mixed hyperlipidemia: Secondary | ICD-10-CM | POA: Diagnosis not present

## 2022-12-02 MED ORDER — SERTRALINE HCL 100 MG PO TABS: 200.0000 mg | ORAL_TABLET | Freq: Every day | ORAL | 3 refills | Status: DC

## 2022-12-02 NOTE — Assessment & Plan Note (Signed)
Last vitamin D Lab Results  Component Value Date   VD25OH 30.99 08/15/2022   Low, to start oral replacement

## 2022-12-02 NOTE — Patient Instructions (Signed)
Please take OTC Vitamin D3 at 2000 units per day, indefinitely  Ok to increase the zoloft to 200 mg per day  Please continue all other medications as before, and refills have been done if requested.  Please have the pharmacy call with any other refills you may need.  Please keep your appointments with your specialists as you may have planned  Please make an Appointment to return in 6 months, or sooner if needed

## 2022-12-02 NOTE — Progress Notes (Signed)
Patient ID: Russell Sampson, male   DOB: 12-14-1950, 72 y.o.   MRN: [id number]        Chief Complaint: follow up anxiety, depression, low vit d, hyperglycemia, hld       HPI:  Russell Sampson is a 72 y.o. male here with c/o worsening anxiety depression in the past month, with several episode near panic, here with wife requesting increased zoloft, has counseling appt next in may 2024.  Pt has also seen neurology recently with increased ST memory worsening but thought c/w pseudodementia rather than alzheimers.  Also Brother died with dementia at 95yo recently, and mother died with same at 3yo recently.  Also a good friend passed last wk at 64 yo.    Grieving and more nervous.  Not taking Vit D Wt Readings from Last 3 Encounters:  12/02/22 198 lb (89.8 kg)  09/18/22 193 lb (87.5 kg)  08/15/22 191 lb (86.6 kg)   BP Readings from Last 3 Encounters:  12/02/22 120/72  09/18/22 (!) 144/66  08/15/22 138/80         Past Medical History:  Diagnosis Date   Arthritis of finger of left hand 07/08/2021   Benign essential tremor    Right-hand   Benign neoplasm of ascending colon    Benign neoplasm of cecum    Benign neoplasm of sigmoid colon    Benign neoplasm of transverse colon    Cataract    bilateral; removed   Chicken pox    Difficult airway for intubation 03/10/2019   Elbow pain 08/29/2013   Generalized anxiety disorder with panic attacks 01/30/2011   Greater trochanteric bursitis of right hip 03/21/2020   Heart murmur 12/14/2019   HLD (hyperlipidemia) 12/14/2019   Hyperglycemia 12/15/2020   Knee effusion, right 04/11/2020   Mild cognitive impairment of uncertain or unknown etiology    Personal history of colonic polyps 10/2007   3-4 small adenomas   Prostate cancer    Right lumbar radiculopathy 03/28/2020   Thumb tendonitis 07/21/2020   Vitamin D deficiency 02/13/2022   Past Surgical History:  Procedure Laterality Date   CLOSED REDUCTION FOREARM FRACTURE     1962   COLONOSCOPY      COLONOSCOPY W/ POLYPECTOMY  10/22/2007   3-4 adenomas, max 6mm   COLONOSCOPY WITH PROPOFOL N/A 05/30/2019   Procedure: COLONOSCOPY WITH PROPOFOL;  Surgeon: Iva Boop, MD;  Location: [facility];  Service: Endoscopy;  Laterality: N/A;   LYMPHADENECTOMY Bilateral 11/10/2016   Procedure: PELVIC LYMPHADENECTOMY;  Surgeon: Heloise Purpura, MD;  Location: [facility];  Service: Urology;  Laterality: Bilateral;   POLYPECTOMY     POLYPECTOMY  05/30/2019   Procedure: POLYPECTOMY;  Surgeon: Iva Boop, MD;  Location: [facility];  Service: Endoscopy;;   PROSTATE BIOPSY     09/19/2016   ROBOT ASSISTED LAPAROSCOPIC RADICAL PROSTATECTOMY N/A 11/10/2016   Procedure: XI ROBOTIC ASSISTED LAPAROSCOPIC RADICAL PROSTATECTOMY LEVEL 2;  Surgeon: Heloise Purpura, MD;  Location: [facility];  Service: Urology;  Laterality: N/A;    reports that he has never smoked. He has never used smokeless tobacco. He reports that he does not drink alcohol and does not use drugs. family history includes Alzheimer's disease in his maternal aunt, mother, and another family member; Alzheimer's disease (age of onset: 40) in his brother; Anxiety disorder in his sister; Prostate cancer in his father; Stroke in his mother. No Known Allergies Current Outpatient Medications on File Prior to Visit  Medication Sig Dispense Refill   acetaminophen (TYLENOL)  500 MG tablet Take 1,000 mg by mouth every 6 (six) hours as needed.     Ascorbic Acid (VITAMIN C) 100 MG tablet Take 100 mg by mouth daily.     aspirin EC 81 MG tablet Take 1 tablet (81 mg total) by mouth daily. Swallow whole. 30 tablet 12   b complex vitamins tablet Take 1 tablet by mouth daily.     memantine (NAMENDA) 10 MG tablet Take 1 tablet  twice a day 60 tablet 11   Menthol, Topical Analgesic, (BENGAY EX) Apply 1 application topically daily as needed (muscle pain).     naproxen (NAPROSYN) 500 MG tablet Take 1 tablet (500 mg total) by mouth 2 (two) times daily as needed. 180 tablet  0   rosuvastatin (CRESTOR) 40 MG tablet Take 1 tablet (40 mg total) by mouth daily. 90 tablet 3   VITAMIN D, CHOLECALCIFEROL, PO Take 2,000 mg by mouth.     No current facility-administered medications on file prior to visit.        ROS:  All others reviewed and negative.  Objective        PE:  BP 120/72 (BP Location: Right Arm, Patient Position: Sitting, Cuff Size: Normal)   Pulse 80   Temp 98.1 F (36.7 C) (Oral)   Ht 5\' 10"  (1.778 m)   Wt 198 lb (89.8 kg)   SpO2 99%   BMI 28.41 kg/m                 Constitutional: Pt appears in NAD               HENT: Head: NCAT.                Right Ear: External ear normal.                 Left Ear: External ear normal.                Eyes: . Pupils are equal, round, and reactive to light. Conjunctivae and EOM are normal               Nose: without d/c or deformity               Neck: Neck supple. Gross normal ROM               Cardiovascular: Normal rate and regular rhythm.                 Pulmonary/Chest: Effort normal and breath sounds without rales or wheezing.                Abd:  Soft, NT, ND, + BS, no organomegaly               Neurological: Pt is alert. At baseline orientation, motor grossly intact               Skin: Skin is warm. No rashes, no other new lesions, LE edema - none               Psychiatric: Pt behavior is normal without agitation , mod nervous  Micro: none  Cardiac tracings I have personally interpreted today:  none  Pertinent Radiological findings (summarize): none   Lab Results  Component Value Date   WBC 7.1 12/30/2021   HGB 13.1 12/30/2021   HCT 39.5 12/30/2021   PLT 245.0 12/30/2021   GLUCOSE 89 08/15/2022   CHOL 129 08/15/2022   TRIG 91.0 08/15/2022  HDL 45.40 08/15/2022   LDLCALC 65 08/15/2022   ALT 12 08/15/2022   AST 15 08/15/2022   NA 138 08/15/2022   K 4.0 08/15/2022   CL 103 08/15/2022   CREATININE 0.69 08/15/2022   BUN 18 08/15/2022   CO2 28 08/15/2022   TSH 2.81 12/30/2021   PSA  0.00 (L) 12/30/2021   HGBA1C 6.4 08/15/2022   Assessment/Plan:  Russell Sampson is a 72 y.o. White or Caucasian [1] male with  has a past medical history of Arthritis of finger of left hand (07/08/2021), Benign essential tremor, Benign neoplasm of ascending colon, Benign neoplasm of cecum, Benign neoplasm of sigmoid colon, Benign neoplasm of transverse colon, Cataract, Chicken pox, Difficult airway for intubation (03/10/2019), Elbow pain (08/29/2013), Generalized anxiety disorder with panic attacks (01/30/2011), Greater trochanteric bursitis of right hip (03/21/2020), Heart murmur (12/14/2019), HLD (hyperlipidemia) (12/14/2019), Hyperglycemia (12/15/2020), Knee effusion, right (04/11/2020), Mild cognitive impairment of uncertain or unknown etiology, Personal history of colonic polyps (10/2007), Prostate cancer, Right lumbar radiculopathy (03/28/2020), Thumb tendonitis (07/21/2020), and Vitamin D deficiency (02/13/2022).  Vitamin D deficiency Last vitamin D Lab Results  Component Value Date   VD25OH 30.99 08/15/2022   Low, to start oral replacement   Generalized anxiety disorder with panic attacks With worsening recenlty with increased stressors, ok for incresaed zoloft 200 qd, cont counseling as planned  HLD (hyperlipidemia) Lab Results  Component Value Date   LDLCALC 65 08/15/2022   Stable, pt to continue current statin crestor 40 mg qd   Hyperglycemia Lab Results  Component Value Date   HGBA1C 6.4 08/15/2022   Stable, pt to continue current medical treatment  - diet, wt control  Followup: Return in about 6 months (around 06/03/2023).  Oliver Barre, MD 12/04/2022 9:26 PM [facility]

## 2022-12-04 ENCOUNTER — Encounter: Payer: Self-pay | Admitting: Internal Medicine

## 2022-12-04 NOTE — Assessment & Plan Note (Signed)
With worsening recenlty with increased stressors, ok for incresaed zoloft 200 qd, cont counseling as planned

## 2022-12-04 NOTE — Assessment & Plan Note (Signed)
Lab Results  Component Value Date   LDLCALC 65 08/15/2022   Stable, pt to continue current statin crestor 40 mg qd  

## 2022-12-04 NOTE — Assessment & Plan Note (Signed)
Lab Results  Component Value Date   HGBA1C 6.4 08/15/2022   Stable, pt to continue current medical treatment  - diet, wt control  

## 2022-12-09 DIAGNOSIS — M25561 Pain in right knee: Secondary | ICD-10-CM | POA: Diagnosis not present

## 2022-12-09 DIAGNOSIS — M1120 Other chondrocalcinosis, unspecified site: Secondary | ICD-10-CM | POA: Diagnosis not present

## 2022-12-09 DIAGNOSIS — Z6828 Body mass index (BMI) 28.0-28.9, adult: Secondary | ICD-10-CM | POA: Diagnosis not present

## 2022-12-09 DIAGNOSIS — E663 Overweight: Secondary | ICD-10-CM | POA: Diagnosis not present

## 2022-12-09 DIAGNOSIS — R5383 Other fatigue: Secondary | ICD-10-CM | POA: Diagnosis not present

## 2022-12-09 DIAGNOSIS — M1991 Primary osteoarthritis, unspecified site: Secondary | ICD-10-CM | POA: Diagnosis not present

## 2023-01-13 ENCOUNTER — Ambulatory Visit (INDEPENDENT_AMBULATORY_CARE_PROVIDER_SITE_OTHER): Payer: Medicare Other | Admitting: Behavioral Health

## 2023-01-13 DIAGNOSIS — F411 Generalized anxiety disorder: Secondary | ICD-10-CM

## 2023-01-13 DIAGNOSIS — G3184 Mild cognitive impairment, so stated: Secondary | ICD-10-CM | POA: Diagnosis not present

## 2023-01-13 NOTE — Progress Notes (Signed)
                Shawnn Bouillon L Casha Estupinan, LMFT 

## 2023-01-16 NOTE — Progress Notes (Signed)
Russell Sampson Initial Adult Exam  Name: Russell Sampson Date: 01/16/2023 MRN: [id number] DOB: 1951-01-06 PCP: Russell Levins, MD  Time spent: 60 min In person @ [facility] - [facility] Office Guardian/Payee:  Russell Sampson Part A & B    Paperwork requested: No   Reason for Visit /Presenting Problem: Elevated anx/dep & stressors due to World situation & Family losses. Pt has exp'd some Cog decline per Wife Russell Sampson.  Mental Status Exam: Appearance:   Casual     Behavior:  Appropriate, Sharing, and repetitive stories that lead to tangential thought patterns manifest in session today  Motor:  Normal  Speech/Language:   Clear and Coherent  Affect:  Appropriate  Mood:  normal  Thought process:  tangential & perseverative  Thought content:    Pt is focused on the past Hx of his FOO  Sensory/Perceptual disturbances:    WNL  Orientation:  oriented to person, place, and time/date  Attention:  Good  Concentration:  Fair  Memory:  Recent mem is fair w/retention issues; Remote mem is good for Hx recollection  Fund of knowledge:   Good  Insight:    Poor  Judgment:   Fair  Impulse Control:  Good    Risk Assessment: Danger to Self:  No Self-injurious Behavior: No Danger to Others: No Duty to Warn:no Physical Aggression / Violence:No  Access to Firearms a concern: No  Gang Involvement:No  Patient / guardian was educated about steps to take if suicide or homicide risk level increases between visits: yes; appropriate to ICD process While future psychiatric events cannot be accurately predicted, the patient does not currently require acute inpatient psychiatric care and does not currently meet [facility] involuntary commitment criteria.  Substance Abuse History: Current substance abuse: No     Past Psychiatric History:   No previous psychological problems have been observed Outpatient Providers: Dr. Oliver Barre, MD History of Psych Hospitalization: No  Psychological Testing:  Pt has  been tested by Neuropsychologist & is due for update in testing by July 2024    Abuse History:  Victim of: No.,  NA    Report needed: No. Victim of Neglect:No. Perpetrator of  NA   Witness / Exposure to Domestic Violence: No   Protective Services Involvement: No  Witness to [employer] Violence:  No   Family History:  Family History  Problem Relation Age of Onset   Alzheimer's disease Mother    Stroke Mother    Prostate cancer Father    Anxiety disorder Sister    Alzheimer's disease Brother 39       early-onset   Alzheimer's disease Other        Maternal aunt's daughter   Alzheimer's disease Maternal Aunt    Colon cancer Neg Hx    Colon polyps Neg Hx    Esophageal cancer Neg Hx    Rectal cancer Neg Hx    Stomach cancer Neg Hx     Living situation: the patient lives with their spouse  Sexual Orientation: Straight  Relationship Status: married  Name of spouse / other: Russell Sampson If a parent, number of children / ages: Cpl does not have children  Support Systems: spouse friends  Surveyor, quantity Stress:  No   Income/Employment/Disability: Neurosurgeon: No   Educational History: Education: Engineer, 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 Friend who was a WWII Vet & lived to 102yo died recently.  Strengths: Supportive Relationships, Family, Friends, Able to Communicate Effectively, and Pt is tender-hearted & feels excessive guilt due to decisions in his late Teens & early 20's about his educational decisions & Parental support of which he feels undeserving.   Barriers:  Cog decline may be challenging  for Pt in session. Thought patterns that are repetitive & perseverative will cause issues in healing thought patterns to make them more adaptable.   Legal History: Pending legal issue / charges: The patient has no significant history of legal issues. History of legal issue / charges:  NA  Medical History/Surgical History: reviewed Past Medical History:  Diagnosis Date   Arthritis of finger of left hand 07/08/2021   Benign essential tremor    Right-hand   Benign neoplasm of ascending colon    Benign neoplasm of cecum    Benign neoplasm of sigmoid colon    Benign neoplasm of transverse colon    Cataract    bilateral; removed   Chicken pox    Difficult airway for intubation 03/10/2019   Elbow pain 08/29/2013   Generalized anxiety disorder with panic attacks 01/30/2011   Greater trochanteric bursitis of right hip 03/21/2020   Heart murmur 12/14/2019   HLD (hyperlipidemia) 12/14/2019   Hyperglycemia 12/15/2020   Knee effusion, right 04/11/2020   Mild cognitive impairment of uncertain or unknown etiology    Personal history of colonic polyps 10/2007   3-4 small adenomas   Prostate cancer    Right lumbar radiculopathy 03/28/2020   Thumb tendonitis 07/21/2020   Vitamin D deficiency 02/13/2022    Past Surgical History:  Procedure Laterality Date   CLOSED REDUCTION FOREARM FRACTURE     1962   COLONOSCOPY     COLONOSCOPY W/ POLYPECTOMY  10/22/2007   3-4 adenomas, max 6mm   COLONOSCOPY WITH PROPOFOL N/A 05/30/2019   Procedure: COLONOSCOPY WITH PROPOFOL;  Surgeon: Iva Boop, MD;  Location: [facility];  Service: Endoscopy;  Laterality: N/A;   LYMPHADENECTOMY Bilateral 11/10/2016   Procedure: PELVIC LYMPHADENECTOMY;  Surgeon: Heloise Purpura, MD;  Location: [facility];  Service: Urology;  Laterality: Bilateral;   POLYPECTOMY     POLYPECTOMY  05/30/2019   Procedure: POLYPECTOMY;  Surgeon: Iva Boop, MD;  Location: [facility];  Service: Endoscopy;;   PROSTATE BIOPSY      09/19/2016   ROBOT ASSISTED LAPAROSCOPIC RADICAL PROSTATECTOMY N/A 11/10/2016   Procedure: XI ROBOTIC ASSISTED LAPAROSCOPIC RADICAL PROSTATECTOMY LEVEL 2;  Surgeon: Heloise Purpura, MD;  Location: [facility];  Service: Urology;  Laterality: N/A;    Medications: Current Outpatient Medications  Medication Sig Dispense Refill   acetaminophen (TYLENOL) 500 MG tablet Take 1,000 mg by mouth every 6 (six) hours as needed.     Ascorbic Acid (VITAMIN C) 100 MG tablet Take 100 mg by mouth daily.     aspirin EC 81 MG tablet Take 1 tablet (81 mg total) by mouth daily. Swallow whole. 30 tablet 12   b complex vitamins tablet Take 1 tablet by mouth daily.     memantine (NAMENDA) 10 MG tablet Take 1 tablet  twice a day 60 tablet 11   Menthol, Topical Analgesic, (BENGAY EX) Apply 1 application topically daily as needed (muscle pain).     naproxen (NAPROSYN) 500 MG tablet Take  1 tablet (500 mg total) by mouth 2 (two) times daily as needed. 180 tablet 0   rosuvastatin (CRESTOR) 40 MG tablet Take 1 tablet (40 mg total) by mouth daily. 90 tablet 3   sertraline (ZOLOFT) 100 MG tablet Take 2 tablets (200 mg total) by mouth daily. 180 tablet 3   VITAMIN D, CHOLECALCIFEROL, PO Take 2,000 mg by mouth.     No current facility-administered medications for this visit.    No Known Allergies  Diagnoses:  Generalized anxiety disorder  Mild cognitive impairment of uncertain or unknown etiology  Plan of Care: Orvilla Fus will attend all sessions w/Wife Russell Sampson as scheduled every 3-4 wks to assist in adjustment to losses & health status changes as related to Clinician today. He will attempt to initiate brain exercises suggested today & monitor for positive results along w/Wife Russell Sampson.  Target Date: 02/15/2023  Progress: 3  Frequency: Once every 3-4 wks  Modality: Family Th w/Pt present   Deneise Lever, LMFT

## 2023-01-20 DIAGNOSIS — M25561 Pain in right knee: Secondary | ICD-10-CM | POA: Diagnosis not present

## 2023-01-20 DIAGNOSIS — M1991 Primary osteoarthritis, unspecified site: Secondary | ICD-10-CM | POA: Diagnosis not present

## 2023-01-20 DIAGNOSIS — M1120 Other chondrocalcinosis, unspecified site: Secondary | ICD-10-CM | POA: Diagnosis not present

## 2023-01-20 DIAGNOSIS — Z6827 Body mass index (BMI) 27.0-27.9, adult: Secondary | ICD-10-CM | POA: Diagnosis not present

## 2023-01-20 DIAGNOSIS — E663 Overweight: Secondary | ICD-10-CM | POA: Diagnosis not present

## 2023-02-04 ENCOUNTER — Ambulatory Visit (INDEPENDENT_AMBULATORY_CARE_PROVIDER_SITE_OTHER): Payer: Medicare Other | Admitting: Behavioral Health

## 2023-02-04 DIAGNOSIS — G3184 Mild cognitive impairment, so stated: Secondary | ICD-10-CM

## 2023-02-04 DIAGNOSIS — F411 Generalized anxiety disorder: Secondary | ICD-10-CM

## 2023-02-04 NOTE — Progress Notes (Signed)
[facility] Counselor/Therapist Progress Note  Patient ID: Russell Sampson, MRN: [id number],    Date: 02/04/2023  Time Spent: 55 min In Person @ [facility] - [facility] Office w/Wife Russell Sampson  Treatment Type: Family with patient  Reported Symptoms: Elevated anxiety w/o depressive Sx today. Pt has circular thought patterns.   Mental Status Exam: Appearance:  Casual     Behavior: Appropriate and Sharing  Motor: Normal  Speech/Language:  Clear and Coherent  Affect: Appropriate  Mood: anxious  Thought process: circumstantial  Thought content:   Rumination  Sensory/Perceptual disturbances:   WNL  Orientation: oriented to person, place, and time/date  Attention: Good  Concentration: Good  Memory: WNL  Fund of knowledge:  Good  Insight:   Fair  Judgment:  Good  Impulse Control: Fair   Risk Assessment: Danger to Self:  No Self-injurious Behavior: No Danger to Others: No Duty to Warn:no Physical Aggression / Violence:No  Access to Firearms a concern: No  Gang Involvement:No   Subjective: Pt & Wife in attendance today. Pt focused on June 6th Trip to [place], [place] for D-Day Celebration/Commemoration.   Pt reports good results w/his 200mg  dosing of Zoloft. He does not panic, his focus has improved & his Bros reports less daily phone calls from Pt. Wife & Pt work well tgthr, although when she does things independently w/o Pt, he calls her excessively w/feelings of dread over something happening to her. Pt explained how he watches the News daily & this tells him how horrible the world is & the ppl in it.   Interventions: Family Systems  Diagnosis:Generalized anxiety disorder  Mild cognitive impairment of uncertain or unknown etiology  Plan: Russell Sampson will reduce the amt of News he watches daily to curtail feelings of dread/loss/urgency over Wife's well-being. He will cont w/any brain exercises he is completing daily, including puzzles, word search, crosswords, & Px exercise such as  walking. He will encourage his Wife Russell Sampson to care for herself & find things to preoccupy himself instead of calling her.  Target Date: 03/17/2023  Progress: 4  Frequency: Every 3-4 wks  Modality: Family w/Pt  Deneise Lever, LMFT

## 2023-02-04 NOTE — Progress Notes (Signed)
                Lidie Glade L Arlenne Kimbley, LMFT 

## 2023-02-10 ENCOUNTER — Ambulatory Visit: Payer: No Typology Code available for payment source | Admitting: Internal Medicine

## 2023-03-05 ENCOUNTER — Encounter: Payer: No Typology Code available for payment source | Admitting: Psychology

## 2023-03-05 ENCOUNTER — Ambulatory Visit (INDEPENDENT_AMBULATORY_CARE_PROVIDER_SITE_OTHER): Payer: Medicare Other | Admitting: Psychology

## 2023-03-05 ENCOUNTER — Ambulatory Visit: Payer: Medicare Other

## 2023-03-05 ENCOUNTER — Encounter: Payer: Self-pay | Admitting: Psychology

## 2023-03-05 DIAGNOSIS — F411 Generalized anxiety disorder: Secondary | ICD-10-CM

## 2023-03-05 DIAGNOSIS — G3184 Mild cognitive impairment, so stated: Secondary | ICD-10-CM

## 2023-03-05 DIAGNOSIS — F410 Panic disorder [episodic paroxysmal anxiety] without agoraphobia: Secondary | ICD-10-CM

## 2023-03-05 DIAGNOSIS — R4189 Other symptoms and signs involving cognitive functions and awareness: Secondary | ICD-10-CM

## 2023-03-05 NOTE — Progress Notes (Signed)
NEUROPSYCHOLOGICAL EVALUATION [facility]. [facility] [place] Department of Neurology  Date of Evaluation: March 05, 2023  Reason for Referral:   JUNE VACHA is a 72 y.o. 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   Assessment and Plan:   Clinical Impression(s): Mr. Heidelberg pattern of performance is suggestive of severe impairment surrounding all aspects of verbal learning and memory. Additional impairments were exhibited across recognition/consolidation aspects of a visual memory task. Performance variability was exhibited across processing speed and executive functioning. Performances were appropriate relative to age-matched peers across attention/concentration, safety/judgment, receptive and expressive language, visuospatial abilities, and encoding aspects of visual memory. Mr. Deere denied difficulties completing instrumental activities of daily living (ADLs) independently. His wife was present and in agreement with this. As such, given evidence for cognitive dysfunction described above, he continues to best meet diagnostic criteria for a Mild Neurocognitive Disorder ("mild cognitive impairment") at the present time.  Relative to his previous evaluation in July 2023, mild memory decline was exhibited. This was primarily exhibited across visual memory retention rates, as well as performance as a whole across yes/no recognition trials. Outside of memory, a mild decline was exhibited across a task assessing abstract reasoning, while mild improvements were exhibited across phonemic fluency. A subtle decline could be argued across semantic fluency; however, current performances remained normatively appropriate. Overall,  non-memory domains exhibited a fairly strong sense of stability relative to previous testing.   Concerns surrounding the early stages of a neurodegenerative illness such as Alzheimer's disease unfortunately continue to be reasonable. He was fully amnestic (i.e., 0% retention) across 2/4 memory tasks, with retention rates ranging from 57% to 65% across the remaining tasks. He consistently performed very poorly across yes/no recognition trials. Taken together, this suggests concerns for rapid forgetting and a pronounced storage impairment, both of which are hallmark testing patterns for this illness. Evidence for objective memory decline over the past 12 months is further concerning. It is encouraging that non-memory areas commonly implicated in this illness exhibited general stability and normatively appropriate performances. This would suggest that Alzheimer's disease, if indeed present, remains in early stages. His rate of decline would further suggest slowed progression over time. Continued medical monitoring will be very important moving forward.   Mr. Berns did exhibit obvious signs of anxiety both during interview, as well as throughout all testing procedures. Prominent anxiety can certainly impact day-to-day functioning, as well as performances across cognitive testing. It is my opinion that anxiety in isolation does not fully explain the extent of memory impairment, nor objective evidence for decline over time and subjective evidence of decline observed by his wife over time. These symptoms would appear to exacerbate a separate cause for memory decline.  Recommendations: A repeat neuropsychological evaluation in 12-24 months (sooner if functional decline is noted) is recommended to assess the trajectory of future cognitive decline should it occur. This will also aid in future efforts towards improved diagnostic clarity.  Mr. Mousseau has already been prescribed a medication aimed to address memory loss  and concerns surrounding Alzheimer's disease (i.e., memantine/Namenda). He is encouraged to continue taking this medication as prescribed. It is important to highlight that this medication has been shown to slow functional decline in some individuals. There is no current treatment which can stop or reverse cognitive  decline when caused by a neurodegenerative illness.   A combination of medication and psychotherapy has been shown to be most effective at treating symptoms of anxiety and depression. As such, Mr. Mance is encouraged to speak with his prescribing physician regarding medication adjustments to optimally manage these symptoms. Likewise, Mr. Kolbeck is encouraged to continue with ongoing psychotherapy to address symptoms of psychiatric distress. Given memory impairment, he would likely benefit from written instructions or strategies for dealing with anxious distress.   Performance across neurocognitive testing is not a strong predictor of an individual's safety operating a motor vehicle. Should his family wish to pursue a formalized driving evaluation, they could reach out to the following agencies: [employer] in [place]: [phone] [facility]: [phone] [facility]: [phone] [facility]: [phone] or [phone]  Should there be progression of current deficits over time, Mr. Egnor is unlikely to regain any independent living skills lost. Therefore, it is recommended that he remain as involved as possible in all aspects of household chores, finances, and medication management, with supervision to ensure adequate performance. He will likely benefit from the establishment and maintenance of a routine in order to maximize his functional abilities over time.  It will be important for Mr. Manville to have another person with him when in situations where he may need to process information, weigh the pros and cons of different options, and make  decisions, in order to ensure that he fully understands and recalls all information to be considered.  If not already done, Mr. Vonbargen and his family may want to discuss his wishes regarding durable power of attorney and medical decision making, so that he can have input into these choices. If they require legal assistance with this, long-term care resource access, or other aspects of estate planning, they could reach out to [employer] at [phone] for a free consultation. Additionally, they may wish to discuss future plans for caretaking and seek out community options for in home/residential care should they become necessary.  Mr. Voiles is encouraged to attend to lifestyle factors for brain health (e.g., regular physical exercise, good nutrition habits and consideration of the MIND-DASH diet, regular participation in cognitively-stimulating activities, and general stress management techniques), which are likely to have benefits for both emotional adjustment and cognition. In fact, in addition to promoting good general health, regular exercise incorporating aerobic activities (e.g., brisk walking, jogging, cycling, etc.) has been demonstrated to be a very effective treatment for depression and stress, with similar efficacy rates to both antidepressant medication and psychotherapy. Optimal control of vascular risk factors (including safe cardiovascular exercise and adherence to dietary recommendations) is encouraged. Continued participation in activities which provide mental stimulation and social interaction is also recommended.   Important information should be provided to Mr. Winchell in written format in all instances. This information should be placed in a highly frequented and easily visible location within his home to promote recall. External strategies such as written notes in a consistently used memory journal, visual and nonverbal auditory cues such as a calendar on the refrigerator or  appointments with alarm, such as on a cell phone, can also help maximize recall.  To address problems with fluctuating attention and/or executive dysfunction, he may wish to consider:   -Avoiding external distractions when needing to concentrate   -Limiting exposure to fast paced environments with multiple sensory demands   -Writing down complicated information and using checklists   -Attempting and completing one task at a time (i.e.,  no multi-tasking)   -Verbalizing aloud each step of a task to maintain focus   -Taking frequent breaks during the completion of steps/tasks to avoid fatigue   -Reducing the amount of information considered at one time   -Scheduling more difficult activities for a time of day where he is usually most alert  Review of Records:   Mr. Hagemann was seen by [facility] Marland KitchenPatrcia Dolly, M.D.) on 02/08/2019 for an evaluation of memory loss. At that time, he did not report strong memory decline. His wife noticed that during the past several years, he would repeat himself. His greatest concern surrounded a strong family history of suspected Alzheimer's disease, including an early-onset presentation in his younger brother. He and his wife noted that this greatly increased anxiety, to the point that he will "totally freak out about it" when he may misplace something around his residence. Generalized anxiety is a longstanding condition. ADLs were described as intact. He denied headaches, dizziness, vision changes, dysarthria/dysphagia, neck/back pain, focal numbness/tingling/weakness, bowel/bladder dysfunction, or anosmia. He denied tremors; however, his wife reminded him of occasional hand shaking, previously diagnosed as benign essential tremor. It does not affect writing or using utensils. Performance on a brief cognitive screening instrument (MOCA) was 26/30.   He was seen by [facility] Marlowe Kays, PA-C) for follow-up on 07/16/2021. At that time, he felt that his  memory had remained about the same. ADLs continued to be intact. Performance on a brief cognitive screening instrument (MOCA) was reportedly 28/30. Ultimately, Mr. Denardo was referred for a comprehensive neuropsychological evaluation to characterize his cognitive abilities and to assist with diagnostic clarity and treatment planning.   He completed a comprehensive neuropsychological evaluation with myself on 02/24/2022. Results suggested a primary impairment surrounding both encoding (i.e., learning) and retrieval aspects of verbal memory. A relative weakness was also exhibited across phonemic fluency, while performance variability was exhibited across processing speed, executive functioning, and recognition/consolidation aspects of verbal memory. Performances were appropriate relative to age-matched peers across attention/concentration, safety/judgment, receptive language, semantic fluency, confrontation naming, visuospatial abilities, and visual learning and memory. ADLs were described as intact and he was subsequently diagnosed with a mild neurocognitive disorder. The underlying cause was uncertain at the present time. Very early stages of Alzheimer's disease could not be ruled out and repeat testing in 12-18 months was recommended.   He was most recently seen by Ms. Wertman on 09/18/2022 for follow-up. He has tolerated memantine well in the interim. Cognition was described as stable by Mr. Jablonowski. However, his wife expressed concern surrounding increased repetition and progressive memory decline. Performance on a brief cognitive screening instrument (MMSE) was 28/30. Ultimately, Mr. Salois was referred for a comprehensive neuropsychological evaluation to characterize his cognitive abilities and to assist with diagnostic clarity and treatment planning.    Brain MRI on 09/03/2018 revealed age-appropriate cerebral volume with extremely mild microvascular ischemic changes. Brain MRI on 08/05/2021 was said to be  stable. No additional neuroimaging was available for review.  Past Medical History:  Diagnosis Date   Arthritis of finger of left hand 07/08/2021   Benign essential tremor    Right-hand   Benign neoplasm of ascending colon    Benign neoplasm of cecum    Benign neoplasm of sigmoid colon    Benign neoplasm of transverse colon    Cataract    bilateral; removed   Cerumen impaction 08/15/2022   Chicken pox    Difficult airway for intubation 03/10/2019   Effusion of right knee joint 04/11/2020  Elbow pain 08/29/2013   Generalized anxiety disorder with panic attacks 01/30/2011   Greater trochanteric bursitis of right hip 03/21/2020   Heart murmur 12/14/2019   HLD (hyperlipidemia) 12/14/2019   Hyperglycemia 12/15/2020   Low back pain 11/17/2022   Mild cognitive impairment of uncertain or unknown etiology 02/24/2022   Obesity 08/26/2018   Personal history of colonic polyps 10/2007   3-4 small adenomas   Posterior neck pain 08/15/2022   Prostate cancer    Right knee pain 08/15/2022   Right lumbar radiculopathy 03/28/2020   Thumb tendonitis 07/21/2020   Vitamin D deficiency 02/13/2022    Past Surgical History:  Procedure Laterality Date   CLOSED REDUCTION FOREARM FRACTURE     1962   COLONOSCOPY     COLONOSCOPY W/ POLYPECTOMY  10/22/2007   3-4 adenomas, max 6mm   COLONOSCOPY WITH PROPOFOL N/A 05/30/2019   Procedure: COLONOSCOPY WITH PROPOFOL;  Surgeon: Iva Boop, MD;  Location: [facility];  Service: Endoscopy;  Laterality: N/A;   LYMPHADENECTOMY Bilateral 11/10/2016   Procedure: PELVIC LYMPHADENECTOMY;  Surgeon: Heloise Purpura, MD;  Location: [facility];  Service: Urology;  Laterality: Bilateral;   POLYPECTOMY     POLYPECTOMY  05/30/2019   Procedure: POLYPECTOMY;  Surgeon: Iva Boop, MD;  Location: [facility];  Service: Endoscopy;;   PROSTATE BIOPSY     09/19/2016   ROBOT ASSISTED LAPAROSCOPIC RADICAL PROSTATECTOMY N/A 11/10/2016   Procedure: XI ROBOTIC ASSISTED  LAPAROSCOPIC RADICAL PROSTATECTOMY LEVEL 2;  Surgeon: Heloise Purpura, MD;  Location: [facility];  Service: Urology;  Laterality: N/A;    Current Outpatient Medications:    acetaminophen (TYLENOL) 500 MG tablet, Take 1,000 mg by mouth every 6 (six) hours as needed., Disp: , Rfl:    Ascorbic Acid (VITAMIN C) 100 MG tablet, Take 100 mg by mouth daily., Disp: , Rfl:    aspirin EC 81 MG tablet, Take 1 tablet (81 mg total) by mouth daily. Swallow whole., Disp: 30 tablet, Rfl: 12   b complex vitamins tablet, Take 1 tablet by mouth daily., Disp: , Rfl:    memantine (NAMENDA) 10 MG tablet, Take 1 tablet  twice a day, Disp: 60 tablet, Rfl: 11   Menthol, Topical Analgesic, (BENGAY EX), Apply 1 application topically daily as needed (muscle pain)., Disp: , Rfl:    naproxen (NAPROSYN) 500 MG tablet, Take 1 tablet (500 mg total) by mouth 2 (two) times daily as needed., Disp: 180 tablet, Rfl: 0   rosuvastatin (CRESTOR) 40 MG tablet, Take 1 tablet (40 mg total) by mouth daily., Disp: 90 tablet, Rfl: 3   sertraline (ZOLOFT) 100 MG tablet, Take 2 tablets (200 mg total) by mouth daily., Disp: 180 tablet, Rfl: 3   VITAMIN D, CHOLECALCIFEROL, PO, Take 2,000 mg by mouth., Disp: , Rfl:   Clinical Interview:   The following information was obtained during a clinical interview with Mr. Maring and his wife prior to cognitive testing.  Cognitive Symptoms: Decreased short-term memory: Endorsed. He alluded to generalized forgetfulness and short-term memory concerns being present in his day-to-day life. No specific examples were provided and he described his perception of stability relative to his previous evaluation in July 2023. His wife previously described prominent repetition in day-to-day conversation. She noted that this had noticeably worsened during the past year. She provided a specific example where he helped her move some furniture in their home, only to ask why she didn't have him help her several hours later. This exact  same back and forth conversation was  said to happen a third time later the same day.  Decreased long-term memory: Denied. Decreased attention/concentration: Denied. Reduced processing speed: Denied. His wife reported her perception of some slowed processing speed.  Difficulties with executive functions: Denied. His wife reported some trouble with organization and following directions. The latter may have more to do with quick forgetting and suspected memory impairment. They both denied trouble with impulsivity or any significant personality changes.  Difficulties with emotion regulation: Denied. Difficulties with receptive language: Denied. Difficulties with word finding: Denied. Decreased visuoperceptual ability: Denied.   Difficulties completing ADLs: Denied. His wife did allude to some greater navigational concerns while driving from time to time. However, no safety concerns were described.   Additional Medical History: History of traumatic brain injury/concussion: Denied. History of stroke: Denied. History of seizure activity: Denied. History of known exposure to toxins: Denied. Symptoms of chronic pain: Denied. Experience of frequent headaches/migraines: Denied. Frequent instances of dizziness/vertigo: Denied.   Sensory changes: Denied. He underwent bilateral cataract surgery in the past to improve his vision.  Balance/coordination difficulties: Denied. They also denied any recent falls.  Other motor difficulties: Endorsed. He does have a history of some very mild shakiness in his right hand, previously diagnosed as benign essential tremor. Symptoms have remained stable over time.   Sleep History: Estimated hours obtained each night: 6-8 hours.  Difficulties falling asleep: Denied. Difficulties staying asleep: Denied. Feels rested and refreshed upon awakening: Endorsed. His wife did note that he can experience fatigue and nap during the day.    History of snoring:  Endorsed. History of waking up gasping for air: Endorsed.  Witnessed breath cessation while asleep: Endorsed. His wife expressed concerns for sleep apnea, stating that Mr. Szatkowski does seem to temporarily stop breathing while asleep and often experiences choking sensations. He has not had a formal sleep study performed.    History of vivid dreaming: Denied. Excessive movement while asleep: Denied. Instances of acting out his dreams: Denied.  Psychiatric/Behavioral Health History: Depression: He described his current mood as fairly positive overall. He did report that his younger brother who suffered from early-onset Alzheimer's disease had passed away this past September 13, 2022 which negatively impacted his mood. Current or remote suicidal ideation, intent, or plan was denied.  Anxiety: Endorsed. Fairly significant generalized anxiety is longstanding in nature. These symptoms have been elevated given his family history of suspected Alzheimer's disease, especially his younger brother's early-onset presentation. His wife previously noted that he would panic whenever he misplaced or lost something. However, this has been improved since his PCP prescribed Zoloft. He has a current counselor to help address ongoing anxiety. This represents a new individual relative to who he was seeing during the last evaluation. He described a far more positive relationship with this individual thus far.  Mania: Denied. Trauma History: Denied. Visual/auditory hallucinations: Denied. Delusional thoughts: Denied.   Tobacco: Denied. Alcohol: He denied current alcohol consumption as well as a history of problematic alcohol abuse or dependence.  Recreational drugs: Denied.  Family History: Problem Relation Age of Onset   Alzheimer's disease Mother    Stroke Mother    Prostate cancer Father    Anxiety disorder Sister    Alzheimer's disease Brother 99       early-onset   Alzheimer's disease Maternal Aunt    Alzheimer's disease  Other        Maternal aunt's daughter   Colon cancer Neg Hx    Colon polyps Neg Hx    Esophageal cancer Neg Hx  Rectal cancer Neg Hx    Stomach cancer Neg Hx    This information was confirmed by Mr. Haycraft.  Academic/Vocational History: Highest level of educational attainment: 16 years. He graduated from high school and earned a Oncologist in music education from [employer]. He described himself as an average (mostly B) student in academic settings. Math was noted as a likely relative weakness.   History of developmental delay: Denied. History of grade repetition: Denied. Enrollment in special education courses: Denied. History of LD/ADHD: Denied.   Employment: Retired. 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   Evaluation Results:   Behavioral Observations: Mr. Jacob was accompanied by his wife, arrived to his appointment on time, and was appropriately dressed and groomed. He appeared alert. Observed gait and station were within normal limits. Gross motor functioning appeared intact upon informal observation and no abnormal movements (e.g., tremors) were noted. His affect was positive but he exhibited obvious signs of anxiety throughout the interview. Spontaneous speech was fluent and word finding difficulties were not observed during the clinical interview. Thought processes were coherent, organized, and normal in content. He was fairly repetitive during interview, often repeating the same content shortly after it had been previously stated. He did not appear to appreciate this behavior. Insight into his cognitive difficulties appeared limited and I worry that he does not have a full appreciation for the true extent of ongoing memory impairment at the present time.   During testing, repetitive statements/remarks continued over the course of the  testing session. He also appeared very anxious throughout testing. Sustained attention was appropriate. Task engagement was adequate and he persisted when challenged. A fire alarm did disrupt testing in the midst of a verbal fluency task (condition S). This condition was repeated once testing was able to be resumed. No memory-related delays were impacted. Overall, Mr. Brander was cooperative with the clinical interview and subsequent testing procedures.   Adequacy of Effort: The validity of neuropsychological testing is limited by the extent to which the individual being tested may be assumed to have exerted adequate effort during testing. Mr. Ratterman expressed his intention to perform to the best of his abilities and exhibited adequate task engagement and persistence. Scores across stand-alone and embedded performance validity measures were within expectation. As such, the results of the current evaluation are believed to be a valid representation of Mr. Kross current cognitive functioning.  Test Results: Mr. Broadus was largely oriented at the time of the current evaluation.  Intellectual abilities based upon educational and vocational attainment were estimated to be in the average range. Premorbid abilities were estimated to be within the below average range based upon a single-word reading test.   Processing speed was variable, ranging from the exceptionally low to average normative ranges. Basic attention was average. More complex attention (e.g., working memory) was also average. Executive functioning was variable, ranging from the exceptionally low to average normative ranges. He performed in the average range across a task assessing safety and judgment.  Assessed receptive language abilities were average. Likewise, Mr. Sebald did not exhibit any difficulties comprehending task instructions and answered all questions asked of him appropriately. Assessed expressive language (e.g., verbal fluency and  confrontation naming) was average to above average.     Assessed visuospatial/visuoconstructional abilities were average to above average.    Learning (i.e., encoding) of novel information was average across a shape  learning task but well below average across all verbal tasks. Spontaneous delayed recall (i.e., retrieval) of previously learned information was below average across a shape learning task but exceptionally low to well below average across all verbal tasks. Retention rates were 65% across a story learning task, 0% across a list learning task, 0% across a daily living task, and 57% across a shape learning task. Performance across recognition tasks was exceptionally low, suggesting negligible evidence for information consolidation.   Results of emotional screening instruments suggested that recent symptoms of generalized anxiety were in the mild range, while symptoms of depression were within normal limits. A screening instrument assessing recent sleep quality suggested the presence of minimal sleep dysfunction.  Tables of Scores:   Note: This summary of test scores accompanies the interpretive report and should not be considered in isolation without reference to the appropriate sections in the text. Descriptors are based on appropriate normative data and may be adjusted based on clinical judgment. Terms such as "Within Normal Limits" and "Outside Normal Limits" are used when a more specific description of the test score cannot be determined. Descriptors refer to the current evaluation only.         Percentile - Normative Descriptor > 98 - Exceptionally High 91-97 - Well Above Average 75-90 - Above Average 25-74 - Average 9-24 - Below Average 2-8 - Well Below Average < 2 - Exceptionally Low        Validity: July 2023 Current  DESCRIPTOR  DCT: --- --- --- Within Normal Limits  NAB EVI: --- --- --- Within Normal Limits  D-KEFS Color Word EI: --- --- --- Within Normal Limits         Orientation:       Raw Score Raw Score Percentile   NAB Orientation, Form 1 28/29 27/29 --- ---        Cognitive Screening:       Raw Score Raw Score Percentile   SLUMS: 25/30 19/30 --- ---        Intellectual Functioning:       Standard Score Standard Score Percentile   Test of Premorbid Functioning: 89 82 12 Below Average        Memory:      NAB Memory Module, Form 1: Standard Score/ T Score Standard Score/ T Score Percentile   Total Memory Index 64 67 1 Exceptionally Low  List Learning        Total Trials 1-3 16/36 (35) 14/36 (33) 5 Well Below Average    List B 3/12 (40) 2/12 (34) 5 Well Below Average    Short Delay Free Recall 2/12 (23) 4/12 (34) 5 Well Below Average    Long Delay Free Recall 0/12 (21) 0/12 (23) <1 Exceptionally Low    Retention Percentage 0 (<19) 0 (22) <1 Exceptionally Low    Recognition Discriminability -1 (25) -2 (24) <1 Exceptionally Low  Shape Learning        Total Trials 1-3 13/27 (42) 17/27 (55) 69 Average    Delayed Recall 5/9 (45) 4/9 (40) 16 Below Average    Retention Percentage 100 (51) 57 (38) 12 Below Average    Recognition Discriminability 4 (37) 1 (21) <1 Exceptionally Low  Story Learning        Immediate Recall 21/80 (21) 37/80 (31) 3 Well Below Average    Delayed Recall 8/40 (29) 15/40 (31) 3 Well Below Average    Retention Percentage 67 (39) 65 (39) 14 Below Average  Daily Living Memory  Immediate Recall 39/51 (42) 33/51 (35) 7 Well Below Average    Delayed Recall 0/17 (19) 0/17 (19) <1 Exceptionally Low    Retention Percentage 0 (<19) 0 (<19) <1 Exceptionally Low    Recognition Hits 8/10 (44) 3/10 (<19) <1 Exceptionally Low        Attention/Executive Function:      Trail Making Test (TMT): Raw Score (T Score) Raw Score (T Score) Percentile     Part A 66 secs.,  0 errors (28) 82 secs.,  0 errors (21) <1 Exceptionally Low    Part B 140 secs.,  1 error (35) 127 secs.,  0 errors (37) 9 Below Average          Scaled Score  Scaled Score Percentile   WAIS-IV Coding: 9 10 50 Average        NAB Attention Module, Form 1: T Score T Score Percentile     Digits Forward 49 44 27 Average    Digits Backwards 45 45 31 Average         Scaled Score Scaled Score Percentile   WAIS-IV Similarities: 8 5 5  Well Below Average        D-KEFS Color-Word Interference Test: Raw Score (Scaled Score) Raw Score (Scaled Score) Percentile     Color Naming 50 secs. (3) 57 secs. (1) <1 Exceptionally Low    Word Reading 21 secs. (12) 28 secs. (9) 37 Average    Inhibition 64 secs. (11) 78 secs. (9) 37 Average      Total Errors 7 errors (4) 2 errors (11) 63 Average    Inhibition/Switching 120 secs. (4) 142 secs. (2) <1 Exceptionally Low      Total Errors 23 errors (1) 20 errors (1) <1 Exceptionally Low        D-KEFS Verbal Fluency Test: Raw Score (Scaled Score) Raw Score (Scaled Score) Percentile     Letter Total Correct 21 (6) 35 (10) 50 Average    Category Total Correct 47 (15) 38 (12) 75 Above Average    Category Switching Total Correct 10 (7) 8 (5) 5 Well Below Average    Category Switching Accuracy 8 (7) 6 (5) 5 Well Below Average      Total Set Loss Errors 7 (4) 11 (1) <1 Exceptionally Low      Total Repetition Errors 11 (2) 6 (7) 16 Below Average        NAB Executive Functions Module, Form 1: T Score T Score Percentile     Judgment 52 48 42 Average        Language:      Verbal Fluency Test: Raw Score (T Score) Raw Score (T Score) Percentile     Phonemic Fluency (FAS) 21 (30) 35 (45) 31 Average    Animal Fluency 24 (58) 17 (47) 38 Average         NAB Language Module, Form 1: T Score T Score Percentile     Auditory Comprehension 55 53 62 Average    Naming 29/31 (41) 29/31 (43) 25 Average        Visuospatial/Visuoconstruction:       Raw Score Raw Score Percentile   Clock Drawing: 9/10 10/10 --- Within Normal Limits        NAB Spatial Module, Form 1: T Score T Score Percentile     Figure Drawing Copy 56 57 75 Above  Average         Scaled Score Scaled Score Percentile   WAIS-IV Block Design: 8 8 25  Average        Mood and Personality:       Raw Score Raw Score Percentile   Beck Depression Inventory - II: 4 1 --- Within Normal Limits  PROMIS Anxiety Questionnaire: 17 16 --- Mild        Additional Questionnaires:       Raw Score Raw Score Percentile   PROMIS Sleep Disturbance Questionnaire: 21 8 --- None to Slight   Informed Consent and Coding/Compliance:   The current evaluation represents a clinical evaluation for the purposes previously outlined by the referral source and is in no way reflective of a forensic evaluation.   Mr. Schellenberg was provided with a verbal description of the nature and purpose of the present neuropsychological evaluation. Also reviewed were the foreseeable risks and/or discomforts and benefits of the procedure, limits of confidentiality, and mandatory reporting requirements of this provider. The patient was given the opportunity to ask questions and receive answers about the evaluation. Oral consent to participate was provided by the patient.   This evaluation was conducted by Newman Nickels, Ph.D., ABPP-CN, board certified clinical neuropsychologist. Mr. Gupton completed a clinical interview with Dr. Milbert Coulter, billed as one unit [phone], and 145 minutes of cognitive testing and scoring, billed as one unit [phone] and four additional units [id number]. Psychometrist Shan Levans, B.S. assisted Dr. Milbert Coulter with test administration and scoring procedures. As a separate and discrete service, one unit [id number] and two units [phone] were billed for Dr. Tammy Sours time spent in interpretation and report writing.

## 2023-03-05 NOTE — Progress Notes (Signed)
   Psychometrician Note   Cognitive testing was administered to Russell Sampson by Shan Levans, B.S. (psychometrist) under the supervision of Dr. Newman Nickels, Ph.D., licensed psychologist on 03/05/2023. Mr. Cornette did not appear overtly distressed by the testing session per behavioral observation or responses across self-report questionnaires. Rest breaks were offered.    The battery of tests administered was selected by Dr. Newman Nickels, Ph.D. with consideration to Mr. Wellen current level of functioning, the nature of his symptoms, emotional and behavioral responses during interview, level of literacy, observed level of motivation/effort, and the nature of the referral question. This battery was communicated to the psychometrist. Communication between Dr. Newman Nickels, Ph.D. and the psychometrist was ongoing throughout the evaluation and Dr. Newman Nickels, Ph.D. was immediately accessible at all times. Dr. Newman Nickels, Ph.D. provided supervision to the psychometrist on the date of this service to the extent necessary to assure the quality of all services provided.    ARMANII PRESSNELL will return within approximately 1-2 weeks for an interactive feedback session with Dr. Milbert Coulter at which time his test performances, clinical impressions, and treatment recommendations will be reviewed in detail. Mr. Kurek understands he can contact our office should he require our assistance before this time.  A total of 145 minutes of billable time were spent face-to-face with Mr. Crill by the psychometrist. This includes both test administration and scoring time. Billing for these services is reflected in the clinical report generated by Dr. Newman Nickels, Ph.D.  This note reflects time spent with the psychometrician and does not include test scores or any clinical interpretations made by Dr. Milbert Coulter. The full report will follow in a separate note.

## 2023-03-09 ENCOUNTER — Ambulatory Visit: Payer: Medicare Other | Admitting: Behavioral Health

## 2023-03-12 ENCOUNTER — Ambulatory Visit (INDEPENDENT_AMBULATORY_CARE_PROVIDER_SITE_OTHER): Payer: Medicare Other | Admitting: Psychology

## 2023-03-12 DIAGNOSIS — G3184 Mild cognitive impairment, so stated: Secondary | ICD-10-CM

## 2023-03-12 NOTE — Progress Notes (Signed)
   Neuropsychology Feedback Session [name]. [facility] Department of Neurology  Reason for Referral:   Russell Sampson is a 71 y.o. 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   Feedback:   Mr. [name] completed a comprehensive neuropsychological evaluation on 03/05/2023. Please refer to that encounter for the full report and recommendations. Briefly, results suggested severe impairment surrounding all aspects of verbal learning and memory. Additional impairments were exhibited across recognition/consolidation aspects of a visual memory task. Performance variability was exhibited across processing speed and executive functioning. Relative to his previous evaluation in July 2023, mild memory decline was exhibited. This was primarily exhibited across visual memory retention rates, as well as performance as a whole across yes/no recognition trials. Outside of memory, a mild decline was exhibited across a task assessing abstract reasoning, while mild improvements were exhibited across phonemic fluency. A subtle decline could be argued across semantic fluency; however, current performances remained normatively appropriate. Overall, non-memory domains exhibited a fairly strong sense of stability relative to previous testing. Concerns surrounding the early stages of a neurodegenerative illness such as Alzheimer's disease unfortunately continue to be reasonable. He was fully amnestic (i.e., 0% retention) across 2/4 memory tasks, with retention rates ranging from 57% to 65% across the remaining tasks. He consistently performed very poorly across yes/no recognition trials. Taken together, this suggests concerns for rapid forgetting and a  pronounced storage impairment, both of which are hallmark testing patterns for this illness. Evidence for objective memory decline over the past 12 months is further concerning. It is encouraging that non-memory areas commonly implicated in this illness exhibited general stability and normatively appropriate performances. This would suggest that Alzheimer's disease, if indeed present, remains in early stages. His rate of decline would further suggest slowed progression over time.   [name] was accompanied by his wife during the current feedback session. Content of the current session focused on the results of his neuropsychological evaluation. [name] was given the opportunity to ask questions and his questions were answered. He was encouraged to reach out should additional questions arise. A copy of his report was provided at the conclusion of the visit.      One unit [phone] was billed for Dr. Tammy Sours time spent preparing for, conducting, and documenting the current feedback session with [name].

## 2023-03-16 ENCOUNTER — Ambulatory Visit (INDEPENDENT_AMBULATORY_CARE_PROVIDER_SITE_OTHER): Payer: Medicare Other | Admitting: Physician Assistant

## 2023-03-16 ENCOUNTER — Encounter: Payer: Self-pay | Admitting: Physician Assistant

## 2023-03-16 VITALS — BP 127/70 | HR 81 | Resp 18 | Ht 70.0 in | Wt 194.0 lb

## 2023-03-16 DIAGNOSIS — G3184 Mild cognitive impairment, so stated: Secondary | ICD-10-CM

## 2023-03-16 MED ORDER — DONEPEZIL HCL 5 MG PO TABS: 5.0000 mg | ORAL_TABLET | Freq: Every day | ORAL | 11 refills | Status: DC

## 2023-03-16 NOTE — Patient Instructions (Signed)
It was a pleasure to see you today at our office.   Recommendations:  Follow up in 6 months  Continue memantine 10 mg twice daily Start Donepezil 5 mg daily  Recommend to continue psychotherapy   RECOMMENDATIONS FOR ALL PATIENTS WITH MEMORY PROBLEMS: 1. Continue to exercise (Recommend 30 minutes of walking everyday, or 3 hours every week) 2. Increase social interactions - continue going to [place] and enjoy social gatherings with friends and family 3. Eat healthy, avoid fried foods and eat more fruits and vegetables 4. Maintain adequate blood pressure, blood sugar, and blood cholesterol level. Reducing the risk of stroke and cardiovascular disease also helps promoting better memory. 5. Avoid stressful situations. Live a simple life and avoid aggravations. Organize your time and prepare for the next day in anticipation. 6. Sleep well, avoid any interruptions of sleep and avoid any distractions in the bedroom that may interfere with adequate sleep quality 7. Avoid sugar, avoid sweets as there is a strong link between excessive sugar intake, diabetes, and cognitive impairment We discussed the Mediterranean diet, which has been shown to help patients reduce the risk of progressive memory disorders and reduces cardiovascular risk. This includes eating fish, eat fruits and green leafy vegetables, nuts like almonds and hazelnuts, walnuts, and also use olive oil. Avoid fast foods and fried foods as much as possible. Avoid sweets and sugar as sugar use has been linked to worsening of memory function.  There is always a concern of gradual progression of memory problems. If this is the case, then we may need to adjust level of care according to patient needs. Support, both to the patient and caregiver, should then be put into place.      You have been referred for a neuropsychological evaluation (i.e., evaluation of memory and thinking abilities). Please bring someone with you to this appointment if  possible, as it is helpful for the doctor to hear from both you and another adult who knows you well. Please bring eyeglasses and hearing aids if you wear them.    The evaluation will take approximately 3 hours and has two parts:   The first part is a clinical interview with the neuropsychologist (Dr. Milbert Coulter or Dr. Roseanne Reno). During the interview, the neuropsychologist will speak with you and the individual you brought to the appointment.    The second part of the evaluation is testing with the doctor's technician Annabelle Harman or Selena Batten). During the testing, the technician will ask you to remember different types of material, solve problems, and answer some questionnaires. Your family member will not be present for this portion of the evaluation.   Please note: We must reserve several hours of the neuropsychologist's time and the psychometrician's time for your evaluation appointment. As such, there is a No-Show fee of $100. If you are unable to attend any of your appointments, please contact our office as soon as possible to reschedule.    FALL PRECAUTIONS: Be cautious when walking. Scan the area for obstacles that may increase the risk of trips and falls. When getting up in the mornings, sit up at the edge of the bed for a few minutes before getting out of bed. Consider elevating the bed at the head end to avoid drop of blood pressure when getting up. Walk always in a well-lit room (use night lights in the walls). Avoid area rugs or power cords from appliances in the middle of the walkways. Use a walker or a cane if necessary and consider physical therapy for  balance exercise. Get your eyesight checked regularly.  FINANCIAL OVERSIGHT: Supervision, especially oversight when making financial decisions or transactions is also recommended.  HOME SAFETY: Consider the safety of the kitchen when operating appliances like stoves, microwave oven, and blender. Consider having supervision and share cooking responsibilities  until no longer able to participate in those. Accidents with firearms and other hazards in the house should be identified and addressed as well.   ABILITY TO BE LEFT ALONE: If patient is unable to contact 911 operator, consider using LifeLine, or when the need is there, arrange for someone to stay with patients. Smoking is a fire hazard, consider supervision or cessation. Risk of wandering should be assessed by caregiver and if detected at any point, supervision and safe proof recommendations should be instituted.  MEDICATION SUPERVISION: Inability to self-administer medication needs to be constantly addressed. Implement a mechanism to ensure safe administration of the medications.   DRIVING: Regarding driving, in patients with progressive memory problems, driving will be impaired. We advise to have someone else do the driving if trouble finding directions or if minor accidents are reported. Independent driving assessment is available to determine safety of driving.   If you are interested in the driving assessment, you can contact the following:  [facility] in [place] [phone]  [facility] [phone]  [facility] [phone] or [phone]    Mediterranean Diet A Mediterranean diet refers to food and lifestyle choices that are based on the traditions of countries located on the [employer]. This way of eating has been shown to help prevent certain conditions and improve outcomes for people who have chronic diseases, like kidney disease and heart disease. What are tips for following this plan? Lifestyle  Cook and eat meals together with your family, when possible. Drink enough fluid to keep your urine clear or pale yellow. Be physically active every day. This includes: Aerobic exercise like running or swimming. Leisure activities like gardening, walking, or housework. Get 7-8 hours of sleep each  night. If recommended by your health care provider, drink red wine in moderation. This means 1 glass a day for nonpregnant women and 2 glasses a day for men. A glass of wine equals 5 oz (150 mL). Reading food labels  Check the serving size of packaged foods. For foods such as rice and pasta, the serving size refers to the amount of cooked product, not dry. Check the total fat in packaged foods. Avoid foods that have saturated fat or trans fats. Check the ingredients list for added sugars, such as corn syrup. Shopping  At the grocery store, buy most of your food from the areas near the walls of the store. This includes: Fresh fruits and vegetables (produce). Grains, beans, nuts, and seeds. Some of these may be available in unpackaged forms or large amounts (in bulk). Fresh seafood. Poultry and eggs. Low-fat dairy products. Buy whole ingredients instead of prepackaged foods. Buy fresh fruits and vegetables in-season from local farmers markets. Buy frozen fruits and vegetables in resealable bags. If you do not have access to quality fresh seafood, buy precooked frozen shrimp or canned fish, such as tuna, salmon, or sardines. Buy small amounts of raw or cooked vegetables, salads, or olives from the deli or salad bar at your store. Stock your pantry so you always have certain foods on hand, such as olive oil, canned tuna, canned tomatoes, rice, pasta, and beans. Cooking  Cook foods with extra-virgin olive oil instead of  using butter or other vegetable oils. Have meat as a side dish, and have vegetables or grains as your main dish. This means having meat in small portions or adding small amounts of meat to foods like pasta or stew. Use beans or vegetables instead of meat in common dishes like chili or lasagna. Experiment with different cooking methods. Try roasting or broiling vegetables instead of steaming or sauteing them. Add frozen vegetables to soups, stews, pasta, or rice. Add nuts or seeds  for added healthy fat at each meal. You can add these to yogurt, salads, or vegetable dishes. Marinate fish or vegetables using olive oil, lemon juice, garlic, and fresh herbs. Meal planning  Plan to eat 1 vegetarian meal one day each week. Try to work up to 2 vegetarian meals, if possible. Eat seafood 2 or more times a week. Have healthy snacks readily available, such as: Vegetable sticks with hummus. Greek yogurt. Fruit and nut trail mix. Eat balanced meals throughout the week. This includes: Fruit: 2-3 servings a day Vegetables: 4-5 servings a day Low-fat dairy: 2 servings a day Fish, poultry, or lean meat: 1 serving a day Beans and legumes: 2 or more servings a week Nuts and seeds: 1-2 servings a day Whole grains: 6-8 servings a day Extra-virgin olive oil: 3-4 servings a day Limit red meat and sweets to only a few servings a month What are my food choices? Mediterranean diet Recommended Grains: Whole-grain pasta. Brown rice. Bulgar wheat. Polenta. Couscous. Whole-wheat bread. Orpah Cobb. Vegetables: Artichokes. Beets. Broccoli. Cabbage. Carrots. Eggplant. Green beans. Chard. Kale. Spinach. Onions. Leeks. Peas. Squash. Tomatoes. Peppers. Radishes. Fruits: Apples. Apricots. Avocado. Berries. Bananas. Cherries. Dates. Figs. Grapes. Lemons. Melon. Oranges. Peaches. Plums. Pomegranate. Meats and other protein foods: Beans. Almonds. Sunflower seeds. Pine nuts. Peanuts. Cod. Salmon. Scallops. Shrimp. Tuna. Tilapia. Clams. Oysters. Eggs. Dairy: Low-fat milk. Cheese. Greek yogurt. Beverages: Water. Red wine. Herbal tea. Fats and oils: Extra virgin olive oil. Avocado oil. Grape seed oil. Sweets and desserts: [place] yogurt with honey. Baked apples. Poached pears. Trail mix. Seasoning and other foods: Basil. Cilantro. Coriander. Cumin. Mint. Parsley. Sage. Rosemary. Tarragon. Garlic. Oregano. Thyme. Pepper. Balsalmic vinegar. Tahini. Hummus. Tomato sauce. Olives. Mushrooms. Limit  these Grains: Prepackaged pasta or rice dishes. Prepackaged cereal with added sugar. Vegetables: Deep fried potatoes (french fries). Fruits: Fruit canned in syrup. Meats and other protein foods: Beef. Pork. Lamb. Poultry with skin. Hot dogs. Tomasa Blase. Dairy: Ice cream. Sour cream. Whole milk. Beverages: Juice. Sugar-sweetened soft drinks. Beer. Liquor and spirits. Fats and oils: Butter. Canola oil. Vegetable oil. Beef fat (tallow). Lard. Sweets and desserts: Cookies. Cakes. Pies. Candy. Seasoning and other foods: Mayonnaise. Premade sauces and marinades. The items listed may not be a complete list. Talk with your dietitian about what dietary choices are right for you. Summary The Mediterranean diet includes both food and lifestyle choices. Eat a variety of fresh fruits and vegetables, beans, nuts, seeds, and whole grains. Limit the amount of red meat and sweets that you eat. Talk with your health care provider about whether it is safe for you to drink red wine in moderation. This means 1 glass a day for nonpregnant women and 2 glasses a day for men. A glass of wine equals 5 oz (150 mL). This information is not intended to replace advice given to you by your health care provider. Make sure you discuss any questions you have with your health care provider. Document Released: 03/27/2016 Document Revised: 04/29/2016 Document Reviewed: 03/27/2016 Elsevier Interactive Patient  Education  2017 [employer].  We have sent a referral to [facility] for your MRI and they will call you directly to schedule your appointment. They are located at [address] [facility]. If you need to contact them directly please call [phone].
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
## 2023-03-18 ENCOUNTER — Ambulatory Visit: Payer: No Typology Code available for payment source | Admitting: Cardiovascular Disease

## 2023-03-18 ENCOUNTER — Ambulatory Visit (INDEPENDENT_AMBULATORY_CARE_PROVIDER_SITE_OTHER): Payer: Medicare Other | Admitting: Behavioral Health

## 2023-03-18 DIAGNOSIS — F411 Generalized anxiety disorder: Secondary | ICD-10-CM

## 2023-03-18 DIAGNOSIS — G3184 Mild cognitive impairment, so stated: Secondary | ICD-10-CM | POA: Diagnosis not present

## 2023-03-18 NOTE — Progress Notes (Signed)
                Victoria L Winstead, LMFT 
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
## 2023-03-29 NOTE — Progress Notes (Unsigned)
Cardiology Clinic Note   Patient Name: Russell Sampson Date of Encounter: 03/31/2023  Primary Care Provider:  Corwin Levins, MD Primary Cardiologist:  Dr. Flora Sampson   Patient Profile    72 year old male with history of cognitive impairment, hyperlipidemia, coronary artery disease, with accident coronary artery calcium score greater than 400.  He was to continue on statin therapy and aspirin.  Nuclear medicine stress test 03/31/2022 was normal without evidence of ischemia.  Echocardiogram on 03/31/2022 revealed normal LV systolic function with grade 1 diastolic dysfunction.  He was to continue secondary prevention.  Last seen in the office on 03/06/2022.  Past Medical History    Past Medical History:  Diagnosis Date   Arthritis of finger of left hand 07/08/2021   Benign essential tremor    Right-hand   Benign neoplasm of ascending colon    Benign neoplasm of cecum    Benign neoplasm of sigmoid colon    Benign neoplasm of transverse colon    Cataract    bilateral; removed   Cerumen impaction 08/15/2022   Chicken pox    Difficult airway for intubation 03/10/2019   Effusion of right knee joint 04/11/2020   Elbow pain 08/29/2013   Generalized anxiety disorder with panic attacks 01/30/2011   Greater trochanteric bursitis of right hip 03/21/2020   Heart murmur 12/14/2019   HLD (hyperlipidemia) 12/14/2019   Hyperglycemia 12/15/2020   Low back pain 11/17/2022   Mild cognitive impairment of uncertain or unknown etiology 02/24/2022   Obesity 08/26/2018   Personal history of colonic polyps 10/2007   3-4 small adenomas   Posterior neck pain 08/15/2022   Prostate cancer    Right knee pain 08/15/2022   Right lumbar radiculopathy 03/28/2020   Thumb tendonitis 07/21/2020   Vitamin D deficiency 02/13/2022   Past Surgical History:  Procedure Laterality Date   CLOSED REDUCTION FOREARM FRACTURE     1962   COLONOSCOPY     COLONOSCOPY W/ POLYPECTOMY  10/22/2007   3-4 adenomas, max 6mm    COLONOSCOPY WITH PROPOFOL N/A 05/30/2019   Procedure: COLONOSCOPY WITH PROPOFOL;  Surgeon: Russell Boop, MD;  Location: [facility];  Service: Endoscopy;  Laterality: N/A;   LYMPHADENECTOMY Bilateral 11/10/2016   Procedure: PELVIC LYMPHADENECTOMY;  Surgeon: Russell Purpura, MD;  Location: [facility];  Service: Urology;  Laterality: Bilateral;   POLYPECTOMY     POLYPECTOMY  05/30/2019   Procedure: POLYPECTOMY;  Surgeon: Russell Boop, MD;  Location: [facility];  Service: Endoscopy;;   PROSTATE BIOPSY     09/19/2016   ROBOT ASSISTED LAPAROSCOPIC RADICAL PROSTATECTOMY N/A 11/10/2016   Procedure: XI ROBOTIC ASSISTED LAPAROSCOPIC RADICAL PROSTATECTOMY LEVEL 2;  Surgeon: Russell Purpura, MD;  Location: [facility];  Service: Urology;  Laterality: N/A;    Allergies  No Known Allergies  History of Present Illness    [name] returns to the office today for ongoing assessment and management of hyperlipidemia, CAD per CTA with normal myocardial perfusion study on 03/31/2022 and normal echocardiogram on 03/31/2022, with chronic dyspnea, felt to be related to deconditioning.   He denies cardiac no chest pain, palpitations, dizziness, or dyspnea on exertion.  He is medically compliant.  He is also being followed by neurology for cognitive impairment.  Home Medications    Current Outpatient Medications  Medication Sig Dispense Refill   acetaminophen (TYLENOL) 500 MG tablet Take 1,000 mg by mouth every 6 (six) hours as needed.     b complex vitamins tablet Take 1 tablet by mouth daily.  colchicine 0.6 MG tablet Take 0.6 mg by mouth daily.     donepezil (ARICEPT) 5 MG tablet Take 1 tablet (5 mg total) by mouth daily. 30 tablet 11   memantine (NAMENDA) 10 MG tablet Take 1 tablet  twice a day 60 tablet 11   Menthol, Topical Analgesic, (BENGAY EX) Apply 1 application topically daily as needed (muscle pain).     naproxen (NAPROSYN) 500 MG tablet Take 1 tablet (500 mg total) by mouth 2 (two) times daily as  needed. 180 tablet 0   rosuvastatin (CRESTOR) 40 MG tablet Take 1 tablet (40 mg total) by mouth daily. 90 tablet 3   sertraline (ZOLOFT) 100 MG tablet Take 2 tablets (200 mg total) by mouth daily. 180 tablet 3   VITAMIN D, CHOLECALCIFEROL, PO Take 2,000 mg by mouth.     No current facility-administered medications for this visit.     Family History    Family History  Problem Relation Age of Onset   Alzheimer's disease Mother    Stroke Mother    Prostate cancer Father    Anxiety disorder Sister    Alzheimer's disease Brother 80       early-onset   Alzheimer's disease Maternal Aunt    Alzheimer's disease Other        Maternal aunt's daughter   Colon cancer Neg Hx    Colon polyps Neg Hx    Esophageal cancer Neg Hx    Rectal cancer Neg Hx    Stomach cancer Neg Hx    He indicated that his mother is deceased. He indicated that his father is deceased. He indicated that his sister is alive. He indicated that his brother is deceased. He indicated that his maternal grandmother is deceased. He indicated that his maternal grandfather is deceased. He indicated that his paternal grandmother is deceased. He indicated that his paternal grandfather is deceased. He indicated that the status of his maternal aunt is unknown. He indicated that the status of his neg hx is unknown. He indicated that the status of his other is unknown.  Social History    Social History   Socioeconomic History   Marital status: Married    Spouse name: Not on file   Number of children: 0   Years of education: 16   Highest education level: Bachelor's degree (e.g., BA, AB, BS)  Occupational History   Occupation: Retired  Tobacco Use   Smoking status: Never   Smokeless tobacco: Never  Vaping Use   Vaping status: Never Used  Substance and Sexual Activity   Alcohol use: No   Drug use: No   Sexual activity: Yes    Partners: Female    Comment: Married  Other Topics Concern   Not on file  Social History Narrative    Fun:    Right handed   One level home   Social Determinants of Health   Financial Resource Strain: Low Risk  (12/01/2022)   Overall Financial Resource Strain (CARDIA)    Difficulty of Paying Living Expenses: Not hard at all  Food Insecurity: No Food Insecurity (12/01/2022)   Hunger Vital Sign    Worried About Running Out of Food in the Last Year: Never true    Ran Out of Food in the Last Year: Never true  Transportation Needs: No Transportation Needs (12/01/2022)   PRAPARE - Administrator, Civil Service (Medical): No    Lack of Transportation (Non-Medical): No  Physical Activity: Insufficiently Active (12/01/2022)   Exercise  Vital Sign    Days of Exercise per Week: 2 days    Minutes of Exercise per Session: 40 min  Stress: Stress Concern Present (12/01/2022)   [employer] of Occupational Health - Occupational Stress Questionnaire    Feeling of Stress : Rather much  Social Connections: Moderately Integrated (12/01/2022)   Social Connection and Isolation Panel [NHANES]    Frequency of Communication with Friends and Family: Three times a week    Frequency of Social Gatherings with Friends and Family: Twice a week    Attends Religious Services: More than 4 times per year    Active Member of [employer] or Organizations: Yes    Attends Banker Meetings: More than 4 times per year    Marital Status: Widowed  Intimate Partner Violence: Not At Risk (05/28/2022)   Humiliation, Afraid, Rape, and Kick questionnaire    Fear of Current or Ex-Partner: No    Emotionally Abused: No    Physically Abused: No    Sexually Abused: No     Review of Systems    General:  No chills, fever, night sweats or weight changes.  Cardiovascular:  No chest pain, dyspnea on exertion, edema, orthopnea, palpitations, paroxysmal nocturnal dyspnea. Dermatological: No rash, lesions/masses Respiratory: No cough, dyspnea Urologic: No hematuria, dysuria Abdominal:   No nausea, vomiting,  diarrhea, bright red blood per rectum, melena, or hematemesis Neurologic:  No visual changes, wkns, changes in mental status. All other systems reviewed and are otherwise negative except as noted above.       Physical Exam    VS:  BP 136/74 (BP Location: Left Arm, Patient Position: Sitting, Cuff Size: Normal)   Pulse 76   Ht 5\' 10"  (1.778 m)   Wt 192 lb 6.4 oz (87.3 kg)   SpO2 95%   BMI 27.61 kg/m  , BMI Body mass index is 27.61 kg/m.     GEN: Well nourished, well developed, in no acute distress. HEENT: normal. Neck: Supple, no JVD, carotid bruits, or masses. Cardiac: RRR, 2/6 systolic murmurs, heard best at the right sternal border and apex, no rubs, or gallops. No clubbing, cyanosis, edema.  Radials/DP/PT 2+ and equal bilaterally.  Respiratory:  Respirations regular and unlabored, clear to auscultation bilaterally. GI: Soft, nontender, nondistended, BS + x 4. MS: no deformity or atrophy. Skin: warm and dry, no rash. Neuro:  Strength and sensation are intact. Psych: Normal affect.      Lab Results  Component Value Date   WBC 7.1 12/30/2021   HGB 13.1 12/30/2021   HCT 39.5 12/30/2021   MCV 83.8 12/30/2021   PLT 245.0 12/30/2021   Lab Results  Component Value Date   CREATININE 0.69 08/15/2022   BUN 18 08/15/2022   NA 138 08/15/2022   K 4.0 08/15/2022   CL 103 08/15/2022   CO2 28 08/15/2022   Lab Results  Component Value Date   ALT 12 08/15/2022   AST 15 08/15/2022   ALKPHOS 84 08/15/2022   BILITOT 0.5 08/15/2022   Lab Results  Component Value Date   CHOL 129 08/15/2022   HDL 45.40 08/15/2022   LDLCALC 65 08/15/2022   TRIG 91.0 08/15/2022   CHOLHDL 3 08/15/2022    Lab Results  Component Value Date   HGBA1C 6.4 08/15/2022     Review of Prior Studies    NM Stress Test 03/31/2022 Nuclear stress EF: 64 %. The left ventricular ejection fraction is normal (55-65%).   Sinus rhythm at rest.  During stress,  at 6 minutes and 50 seconds, 2 mm of horizontal ST  segment depression seen in V4 and V5.   There is normal perfusion uptake seen at both rest and stress.  There is no evidence of ischemia or infarction on perfusion imaging.  There is no transient ischemic dilatation (1.06)   Stress test was stopped secondary to fatigue.  No chest discomfort was reported.   Abnormal exercise stress test portion of study.  Normal nuclear perfusion study at both rest and stress.  Overall low risk study, possible false positive exercise stress test portion.   If symptoms worsen or become more worrisome, further cardiac testing may be warranted in the setting of high coronary calcium score  Echocardiogram 03/31/2022 1. Left ventricular ejection fraction by 3D volume is 56 %. The left  ventricle has normal function. The left ventricle has no regional wall  motion abnormalities. Left ventricular diastolic parameters are consistent  with Grade I diastolic dysfunction  (impaired relaxation).   2. Right ventricular systolic function is normal. The right ventricular  size is normal. There is normal pulmonary artery systolic pressure.   3. The mitral valve is normal in structure. Mild to moderate mitral valve  regurgitation. No evidence of mitral stenosis.   4. The aortic valve is tricuspid. Aortic valve regurgitation is not  visualized. Aortic valve sclerosis is present, with no evidence of aortic  valve stenosis.   5. There is borderline dilatation of the ascending aorta, measuring 37  mm.   6. The inferior vena cava is normal in size with greater than 50%  respiratory variability, suggesting right atrial pressure of 3 mmHg.    Assessment & Plan   1.  Coronary artery disease: Coronary calcium score per coronary CTA on 02/07/2022 was 777.  Repeat nuclear medicine study was abnormal concerning exercise tolerance but there were no perfusion deficits.  Continue secondary management with blood pressure control, lipid management, purposeful exercise, and weight maintenance.   He denies any symptoms currently.  2.  Hypercholesterolemia: Goal of LDL less than 100.  He remains on rosuvastatin 40 mg daily.  Labs are provided by primary care provider.  3.  Cognitive impairment: Being followed by neurology who requested home sleep study.  This will be ordered.  If found to have OSA will defer to neurology for management.       Signed, Bettey Mare. Liborio Nixon, ANP, AACC   03/31/2023 9:45 AM      Office [phone] Fax [phone]  Notice: This dictation was prepared with Dragon dictation along with smaller phrase technology. Any transcriptional errors that result from this process are unintentional and may not be corrected upon review.

## 2023-03-31 ENCOUNTER — Ambulatory Visit: Payer: Medicare Other | Attending: Cardiovascular Disease | Admitting: Adult Health

## 2023-03-31 ENCOUNTER — Encounter: Payer: Self-pay | Admitting: Adult Health

## 2023-03-31 VITALS — BP 136/74 | HR 76 | Ht 70.0 in | Wt 192.4 lb

## 2023-03-31 DIAGNOSIS — E7800 Pure hypercholesterolemia, unspecified: Secondary | ICD-10-CM | POA: Diagnosis not present

## 2023-03-31 DIAGNOSIS — R4189 Other symptoms and signs involving cognitive functions and awareness: Secondary | ICD-10-CM | POA: Diagnosis not present

## 2023-03-31 DIAGNOSIS — I2510 Atherosclerotic heart disease of native coronary artery without angina pectoris: Secondary | ICD-10-CM | POA: Insufficient documentation

## 2023-03-31 MED ORDER — ROSUVASTATIN CALCIUM 40 MG PO TABS: 40.0000 mg | ORAL_TABLET | Freq: Every day | ORAL | 11 refills | Status: DC

## 2023-03-31 NOTE — Patient Instructions (Signed)
Medication Instructions:  No Changes *If you need a refill on your cardiac medications before your next appointment, please call your pharmacy*   Lab Work: No Labs If you have labs (blood work) drawn today and your tests are completely normal, you will receive your results only by: MyChart Message (if you have MyChart) OR A paper copy in the mail If you have any lab test that is abnormal or we need to change your treatment, we will call you to review the results.   Testing/Procedures: [facility]. Your physician has recommended that you have a sleep study. This test records several body functions during sleep, including: brain activity, eye movement, oxygen and carbon dioxide blood levels, heart rate and rhythm, breathing rate and rhythm, the flow of air through your mouth and nose, snoring, body muscle movements, and chest and belly movement.    Follow-Up: At [facility], you and your health needs are our priority.  As part of our continuing mission to provide you with exceptional heart care, we have created designated Provider Care Teams.  These Care Teams include your primary Cardiologist (physician) and Advanced Practice Providers (APPs -  Physician Assistants and Nurse Practitioners) who all work together to provide you with the care you need, when you need it.  We recommend signing up for the patient portal called "MyChart".  Sign up information is provided on this After Visit Summary.  MyChart is used to connect with patients for Virtual Visits (Telemedicine).  Patients are able to view lab/test results, encounter notes, upcoming appointments, etc.  Non-urgent messages can be sent to your provider as well.   To learn more about what you can do with MyChart, go to [email or web address].    Your next appointment:   1 year(s)  Provider:   Reatha Harps, MD

## 2023-04-07 NOTE — Progress Notes (Signed)
                Victoria L Winstead, LMFT 

## 2023-04-08 ENCOUNTER — Ambulatory Visit (INDEPENDENT_AMBULATORY_CARE_PROVIDER_SITE_OTHER): Payer: Medicare Other | Admitting: Behavioral Health

## 2023-04-08 DIAGNOSIS — G3184 Mild cognitive impairment, so stated: Secondary | ICD-10-CM

## 2023-04-08 DIAGNOSIS — F411 Generalized anxiety disorder: Secondary | ICD-10-CM

## 2023-04-16 DIAGNOSIS — H43811 Vitreous degeneration, right eye: Secondary | ICD-10-CM | POA: Diagnosis not present

## 2023-04-16 DIAGNOSIS — D487 Neoplasm of uncertain behavior of other specified sites: Secondary | ICD-10-CM | POA: Diagnosis not present

## 2023-04-16 DIAGNOSIS — H35033 Hypertensive retinopathy, bilateral: Secondary | ICD-10-CM | POA: Diagnosis not present

## 2023-04-16 DIAGNOSIS — H35371 Puckering of macula, right eye: Secondary | ICD-10-CM | POA: Diagnosis not present

## 2023-04-16 LAB — HM DIABETES EYE EXAM

## 2023-04-16 NOTE — Progress Notes (Signed)
[facility] Counselor/Therapist Progress Note  Patient ID: Russell Sampson, MRN: [id number],    Date: 04/16/2023  Time Spent: 55 min In person @ [facility] Office Time In: 3:00pm Time Out: 3:55pm  Treatment Type:  Cpl Th  Reported Symptoms: Consistent Sx of anx/dep & stress due to inc'd memory issues, need for repetition & inc'd reliance on lists & post-its  Mental Status Exam: Appearance:  Casual     Behavior: Appropriate, Sharing, and Grandiose  Motor: Normal  Speech/Language:  Clear and Coherent  Affect: Appropriate  Mood: anxious  Thought process: Perseverative in nature w/cont'd repetition of concerns for WWII, the economy & politics  Thought content:   Obsessions  Sensory/Perceptual disturbances:   WNL  Orientation: oriented to person, place, and situation  Attention: Good  Concentration: Good  Memory: Dx of MCI  Fund of knowledge:  Good  Insight:   Poor  Judgment:  Fair  Impulse Control: Poor   Risk Assessment: Danger to Self:  No Self-injurious Behavior: No Danger to Others: No Duty to Warn:no Physical Aggression / Violence:No  Access to Firearms a concern: No  Gang Involvement:No   Subjective: Pt is w/Wife [place] today. She is fully in Caregiver mode, nudging him to stay focused & on track during the session. Pt is more repetitive in his story-telling & in his opinions & ideas about the World.  Interventions: Psycho-education/Bibliotherapy and Family Systems  Diagnosis:Mild cognitive impairment of uncertain or unknown etiology  Generalized anxiety disorder  Plan: Russell Sampson has had a friend die recently from Fronto-Temporal Dementia. This has worried him as his Family Hx is full of brain functioning disorders. His repetition & perseverative discussion of death & morbidity bothers [name]. Russell Sampson will be more accepting of [name]'s levels of frustration & try to listen to her when she wants to limit the morbidity. [name] is  trying to maintain her good nature & also needs time to discuss her interests in session. Validation provided for both today.   Target Date: 05/18/2023  Progress:4  Frequency: Every 2-3 wks  Modality: Cpl Th  Deneise Lever, LMFT

## 2023-04-28 DIAGNOSIS — D487 Neoplasm of uncertain behavior of other specified sites: Secondary | ICD-10-CM | POA: Diagnosis not present

## 2023-05-11 ENCOUNTER — Ambulatory Visit: Payer: Medicare Other | Admitting: Behavioral Health

## 2023-05-19 DIAGNOSIS — E663 Overweight: Secondary | ICD-10-CM | POA: Diagnosis not present

## 2023-05-19 DIAGNOSIS — M1991 Primary osteoarthritis, unspecified site: Secondary | ICD-10-CM | POA: Diagnosis not present

## 2023-05-19 DIAGNOSIS — Z79899 Other long term (current) drug therapy: Secondary | ICD-10-CM | POA: Diagnosis not present

## 2023-05-19 DIAGNOSIS — M1120 Other chondrocalcinosis, unspecified site: Secondary | ICD-10-CM | POA: Diagnosis not present

## 2023-05-19 DIAGNOSIS — M25561 Pain in right knee: Secondary | ICD-10-CM | POA: Diagnosis not present

## 2023-05-19 DIAGNOSIS — Z6827 Body mass index (BMI) 27.0-27.9, adult: Secondary | ICD-10-CM | POA: Diagnosis not present

## 2023-05-20 DIAGNOSIS — Z23 Encounter for immunization: Secondary | ICD-10-CM | POA: Diagnosis not present

## 2023-05-27 ENCOUNTER — Ambulatory Visit (INDEPENDENT_AMBULATORY_CARE_PROVIDER_SITE_OTHER): Payer: Medicare Other | Admitting: Behavioral Health

## 2023-05-27 DIAGNOSIS — G3184 Mild cognitive impairment, so stated: Secondary | ICD-10-CM | POA: Diagnosis not present

## 2023-05-27 DIAGNOSIS — F411 Generalized anxiety disorder: Secondary | ICD-10-CM

## 2023-05-27 NOTE — Progress Notes (Signed)
                Anastasya Jewell L Farryn Linares, LMFT 

## 2023-06-03 ENCOUNTER — Telehealth: Payer: Self-pay | Admitting: Physician Assistant

## 2023-06-03 ENCOUNTER — Ambulatory Visit: Payer: No Typology Code available for payment source | Admitting: Internal Medicine

## 2023-06-03 NOTE — Telephone Encounter (Signed)
Pt's wife called in stating since adding the second medication in July, she is seeing increased memory gaps and forgetfulness. She is concerned that he is having a greater decline than she thinks he should.

## 2023-06-03 NOTE — Progress Notes (Signed)
[facility] Counselor/Therapist Progress Note  Patient ID: Russell Sampson, MRN: [id number],    Date: 05/27/2023  Time Spent: 55 min In Person @ [facility] Office Time In: 1:00pm Time Out: 1:55pm   Treatment Type:  Cpl Th  Reported Symptoms: Elevated anx/dep due to concerns for Wife [name] & seeing her upset in session today. Pt is remaining fairly active. He cont's to drive on a limited basis & enjoys yardwork daily.   Mental Status Exam: Appearance:  Casual     Behavior: Appropriate and Sharing  Motor: Normal  Speech/Language:  Clear and Coherent  Affect: Depressed and perseverative on topics that are sad  Mood: anxious  Thought process: Fairly organized & constructed cohesively  Thought content:   WNL  Sensory/Perceptual disturbances:   WNL  Orientation: oriented to person, place, time/date, and situation  Attention: Good  Concentration: Fair to good  Memory: MCI w/bouts of repetitiveness  Fund of knowledge:  Good  Insight:   Fair  Judgment:  Good  Impulse Control: Good   Risk Assessment: Danger to Self:  No Self-injurious Behavior: No Danger to Others: No Duty to Warn:no Physical Aggression / Violence:No  Access to Firearms a concern: No  Gang Involvement:No   Subjective: Wife reports Pt is leaving the spare Bedrm messy w/dirty clothes, forgetting, repeating stories daily, lack of ability to use the computer, & causing arguments w/his beh.  Discussed Pt condition w/fluid language & support for Wife.   Interventions:  Support for MCI w/emphasis on Pt needs from Neurology Report, encouagement for Wife's psychoedu & understanding of Pt's limitations  Diagnosis:Mild cognitive impairment of uncertain or unknown etiology  Generalized anxiety disorder  Plan: Wife [name] is in tears this session describing all the changes she is seeing & exp'g w/Tom. Her tears causing mild discomfort for [name] today as he is not fully aware of her frustrations & lack of  ability to be flexible w/him @ the present time. Provided Spousal support & emphasized need for Wife to come to next session alone.  Target Date: 07/03/2023  Progress: 4  Frequency: Once every 2-3 wks  Modality: Cpl Th  Deneise Lever, LMFT

## 2023-06-04 NOTE — Telephone Encounter (Signed)
I advised to increase aricpet 10mg 

## 2023-06-08 ENCOUNTER — Encounter: Payer: Self-pay | Admitting: Internal Medicine

## 2023-06-08 ENCOUNTER — Ambulatory Visit: Payer: Medicare Other | Admitting: Internal Medicine

## 2023-06-08 ENCOUNTER — Telehealth: Payer: Self-pay | Admitting: Internal Medicine

## 2023-06-08 VITALS — BP 110/68 | HR 76 | Temp 97.7°F | Ht 70.0 in | Wt 193.0 lb

## 2023-06-08 DIAGNOSIS — F410 Panic disorder [episodic paroxysmal anxiety] without agoraphobia: Secondary | ICD-10-CM | POA: Diagnosis not present

## 2023-06-08 DIAGNOSIS — E782 Mixed hyperlipidemia: Secondary | ICD-10-CM | POA: Diagnosis not present

## 2023-06-08 DIAGNOSIS — E559 Vitamin D deficiency, unspecified: Secondary | ICD-10-CM | POA: Diagnosis not present

## 2023-06-08 DIAGNOSIS — Z79899 Other long term (current) drug therapy: Secondary | ICD-10-CM | POA: Insufficient documentation

## 2023-06-08 DIAGNOSIS — N320 Bladder-neck obstruction: Secondary | ICD-10-CM

## 2023-06-08 DIAGNOSIS — F411 Generalized anxiety disorder: Secondary | ICD-10-CM

## 2023-06-08 DIAGNOSIS — T884XXD Failed or difficult intubation, subsequent encounter: Secondary | ICD-10-CM

## 2023-06-08 DIAGNOSIS — R739 Hyperglycemia, unspecified: Secondary | ICD-10-CM

## 2023-06-08 DIAGNOSIS — Z860100 Personal history of colon polyps, unspecified: Secondary | ICD-10-CM

## 2023-06-08 DIAGNOSIS — G3184 Mild cognitive impairment, so stated: Secondary | ICD-10-CM | POA: Diagnosis not present

## 2023-06-08 DIAGNOSIS — E538 Deficiency of other specified B group vitamins: Secondary | ICD-10-CM

## 2023-06-08 DIAGNOSIS — M11261 Other chondrocalcinosis, right knee: Secondary | ICD-10-CM | POA: Insufficient documentation

## 2023-06-08 LAB — TSH: TSH: 3.41 u[IU]/mL (ref 0.35–5.50)

## 2023-06-08 LAB — URINALYSIS, ROUTINE W REFLEX MICROSCOPIC
Bilirubin Urine: NEGATIVE
Ketones, ur: NEGATIVE
Leukocytes,Ua: NEGATIVE
Nitrite: NEGATIVE
Specific Gravity, Urine: 1.025 (ref 1.000–1.030)
Total Protein, Urine: NEGATIVE
Urine Glucose: NEGATIVE
Urobilinogen, UA: 0.2 (ref 0.0–1.0)
pH: 6 (ref 5.0–8.0)

## 2023-06-08 LAB — LIPID PANEL
Cholesterol: 138 mg/dL (ref 0–200)
HDL: 49.2 mg/dL (ref >39.00)
LDL Cholesterol: 68 mg/dL (ref 0–99)
NonHDL: 88.63
Total CHOL/HDL Ratio: 3
Triglycerides: 102 mg/dL (ref 0.0–149.0)
VLDL: 20.4 mg/dL (ref 0.0–40.0)

## 2023-06-08 LAB — CBC WITH DIFFERENTIAL/PLATELET
Basophils Absolute: 0.1 10*3/uL (ref 0.0–0.1)
Basophils Relative: 1 % (ref 0.0–3.0)
Eosinophils Absolute: 0.5 10*3/uL (ref 0.0–0.7)
Eosinophils Relative: 7.6 % — ABNORMAL HIGH (ref 0.0–5.0)
HCT: 43.8 % (ref 39.0–52.0)
Hemoglobin: 14.1 g/dL (ref 13.0–17.0)
Lymphocytes Relative: 23.5 % (ref 12.0–46.0)
Lymphs Abs: 1.5 10*3/uL (ref 0.7–4.0)
MCHC: 32.3 g/dL (ref 30.0–36.0)
MCV: 85 fL (ref 78.0–100.0)
Monocytes Absolute: 0.5 10*3/uL (ref 0.1–1.0)
Monocytes Relative: 8.3 % (ref 3.0–12.0)
Neutro Abs: 3.9 10*3/uL (ref 1.4–7.7)
Neutrophils Relative %: 59.6 % (ref 43.0–77.0)
Platelets: 246 10*3/uL (ref 150.0–400.0)
RBC: 5.15 Mil/uL (ref 4.22–5.81)
RDW: 13.7 % (ref 11.5–15.5)
WBC: 6.6 10*3/uL (ref 4.0–10.5)

## 2023-06-08 LAB — HEPATIC FUNCTION PANEL
ALT: 25 U/L (ref 0–53)
AST: 21 U/L (ref 0–37)
Albumin: 4.2 g/dL (ref 3.5–5.2)
Alkaline Phosphatase: 88 U/L (ref 39–117)
Bilirubin, Direct: 0.1 mg/dL (ref 0.0–0.3)
Total Bilirubin: 0.7 mg/dL (ref 0.2–1.2)
Total Protein: 6.5 g/dL (ref 6.0–8.3)

## 2023-06-08 LAB — BASIC METABOLIC PANEL
BUN: 14 mg/dL (ref 6–23)
CO2: 28 meq/L (ref 19–32)
Calcium: 9.2 mg/dL (ref 8.4–10.5)
Chloride: 104 meq/L (ref 96–112)
Creatinine, Ser: 0.79 mg/dL (ref 0.40–1.50)
GFR: 90.15 mL/min (ref >60.00)
Glucose, Bld: 96 mg/dL (ref 70–99)
Potassium: 4.4 meq/L (ref 3.5–5.1)
Sodium: 140 meq/L (ref 135–145)

## 2023-06-08 LAB — VITAMIN D 25 HYDROXY (VIT D DEFICIENCY, FRACTURES): VITD: 49.46 ng/mL (ref 30.00–100.00)

## 2023-06-08 LAB — HEMOGLOBIN A1C: Hgb A1c MFr Bld: 6 % (ref 4.6–6.5)

## 2023-06-08 LAB — VITAMIN B12: Vitamin B-12: 266 pg/mL (ref 211–911)

## 2023-06-08 LAB — PSA: PSA: 0.06 ng/mL — ABNORMAL LOW (ref 0.10–4.00)

## 2023-06-08 MED ORDER — DONEPEZIL HCL 5 MG PO TABS: 5.0000 mg | ORAL_TABLET | Freq: Two times a day (BID) | ORAL | Status: DC

## 2023-06-08 NOTE — Progress Notes (Signed)
The test results show that your current treatment is OK, as the tests are stable.  Please continue the same plan.  There is no other need for change of treatment or further evaluation based on these results, at this time.  thanks 

## 2023-06-08 NOTE — Telephone Encounter (Signed)
Doing colonoscopy recalls and see that he is due  Encounter Diagnoses  Name Primary?   History of colonic polyps Yes   Difficult airway for intubation, subsequent encounter      Needs colonoscopy appointment at hospital and a previsit coordinated with that appointment

## 2023-06-08 NOTE — Progress Notes (Unsigned)
Patient ID: Russell Sampson, male   DOB: [birth year], 72 y.o.   MRN: [id number]        Chief Complaint: follow up HTN, HLD and hyperglycemia ***       HPI:  Russell Sampson is a 72 y.o. male here with c/o         Mother and brother died with dementia. Has recent onset MCI and now on aricept and namenda, now on 5 mg bid aricept , and bid namenda as wel.  Wt Readings from Last 3 Encounters:  06/08/23 193 lb (87.5 kg)  03/31/23 192 lb 6.4 oz (87.3 kg)  03/16/23 194 lb (88 kg)   BP Readings from Last 3 Encounters:  06/08/23 110/68  03/31/23 136/74  03/16/23 127/70         Past Medical History:  Diagnosis Date   Arthritis of finger of left hand 07/08/2021   Benign essential tremor    Right-hand   Benign neoplasm of ascending colon    Benign neoplasm of cecum    Benign neoplasm of sigmoid colon    Benign neoplasm of transverse colon    Cataract    bilateral; removed   Cerumen impaction 08/15/2022   Chicken pox    Difficult airway for intubation 03/10/2019   Effusion of right knee joint 04/11/2020   Elbow pain 08/29/2013   Generalized anxiety disorder with panic attacks 01/30/2011   Greater trochanteric bursitis of right hip 03/21/2020   Heart murmur 12/14/2019   HLD (hyperlipidemia) 12/14/2019   Hyperglycemia 12/15/2020   Low back pain 11/17/2022   Mild cognitive impairment of uncertain or unknown etiology 02/24/2022   Obesity 08/26/2018   Personal history of colonic polyps 10/2007   3-4 small adenomas   Posterior neck pain 08/15/2022   Prostate cancer    Right knee pain 08/15/2022   Right lumbar radiculopathy 03/28/2020   Thumb tendonitis 07/21/2020   Vitamin D deficiency 02/13/2022   Past Surgical History:  Procedure Laterality Date   CLOSED REDUCTION FOREARM FRACTURE     1962   COLONOSCOPY     COLONOSCOPY W/ POLYPECTOMY  10/22/2007   3-4 adenomas, max 6mm   COLONOSCOPY WITH PROPOFOL N/A 05/30/2019   Procedure: COLONOSCOPY WITH PROPOFOL;  Surgeon: Iva Boop,  MD;  Location: [facility];  Service: Endoscopy;  Laterality: N/A;   LYMPHADENECTOMY Bilateral 11/10/2016   Procedure: PELVIC LYMPHADENECTOMY;  Surgeon: Heloise Purpura, MD;  Location: [facility];  Service: Urology;  Laterality: Bilateral;   POLYPECTOMY     POLYPECTOMY  05/30/2019   Procedure: POLYPECTOMY;  Surgeon: Iva Boop, MD;  Location: [facility];  Service: Endoscopy;;   PROSTATE BIOPSY     09/19/2016   ROBOT ASSISTED LAPAROSCOPIC RADICAL PROSTATECTOMY N/A 11/10/2016   Procedure: XI ROBOTIC ASSISTED LAPAROSCOPIC RADICAL PROSTATECTOMY LEVEL 2;  Surgeon: Heloise Purpura, MD;  Location: [facility];  Service: Urology;  Laterality: N/A;    reports that he has never smoked. He has never used smokeless tobacco. He reports that he does not drink alcohol and does not use drugs. family history includes Alzheimer's disease in his maternal aunt, mother, and another family member; Alzheimer's disease (age of onset: 22) in his brother; Anxiety disorder in his sister; Prostate cancer in his father; Stroke in his mother. No Known Allergies Current Outpatient Medications on File Prior to Visit  Medication Sig Dispense Refill   acetaminophen (TYLENOL) 500 MG tablet Take 1,000 mg by mouth every 6 (six) hours as needed.     b  complex vitamins tablet Take 1 tablet by mouth daily.     colchicine 0.6 MG tablet Take 0.6 mg by mouth daily.     donepezil (ARICEPT) 5 MG tablet Take 1 tablet (5 mg total) by mouth daily. 30 tablet 11   memantine (NAMENDA) 10 MG tablet Take 1 tablet  twice a day 60 tablet 11   Menthol, Topical Analgesic, (BENGAY EX) Apply 1 application topically daily as needed (muscle pain).     naproxen (NAPROSYN) 500 MG tablet Take 1 tablet (500 mg total) by mouth 2 (two) times daily as needed. 180 tablet 0   rosuvastatin (CRESTOR) 40 MG tablet Take 1 tablet (40 mg total) by mouth daily. 30 tablet 11   sertraline (ZOLOFT) 100 MG tablet Take 2 tablets (200 mg total) by mouth daily. 180 tablet 3    VITAMIN D, CHOLECALCIFEROL, PO Take 2,000 mg by mouth.     No current facility-administered medications on file prior to visit.        ROS:  All others reviewed and negative.  Objective        PE:  BP 110/68 (BP Location: Right Arm, Patient Position: Sitting, Cuff Size: Normal)   Pulse 76   Temp 97.7 F (36.5 C) (Oral)   Ht 5\' 10"  (1.778 m)   Wt 193 lb (87.5 kg)   SpO2 98%   BMI 27.69 kg/m                 Constitutional: Pt appears in NAD               HENT: Head: NCAT.                Right Ear: External ear normal.                 Left Ear: External ear normal.                Eyes: . Pupils are equal, round, and reactive to light. Conjunctivae and EOM are normal               Nose: without d/c or deformity               Neck: Neck supple. Gross normal ROM               Cardiovascular: Normal rate and regular rhythm.                 Pulmonary/Chest: Effort normal and breath sounds without rales or wheezing.                Abd:  Soft, NT, ND, + BS, no organomegaly               Neurological: Pt is alert. At baseline orientation, motor grossly intact               Skin: Skin is warm. No rashes, no other new lesions, LE edema - ***               Psychiatric: Pt behavior is normal without agitation   Micro: none  Cardiac tracings I have personally interpreted today:  none  Pertinent Radiological findings (summarize): none   Lab Results  Component Value Date   WBC 7.1 12/30/2021   HGB 13.1 12/30/2021   HCT 39.5 12/30/2021   PLT 245.0 12/30/2021   GLUCOSE 89 08/15/2022   CHOL 129 08/15/2022   TRIG 91.0 08/15/2022   HDL 45.40 08/15/2022   LDLCALC  65 08/15/2022   ALT 12 08/15/2022   AST 15 08/15/2022   NA 138 08/15/2022   K 4.0 08/15/2022   CL 103 08/15/2022   CREATININE 0.69 08/15/2022   BUN 18 08/15/2022   CO2 28 08/15/2022   TSH 2.81 12/30/2021   PSA 0.00 (L) 12/30/2021   HGBA1C 6.4 08/15/2022   Assessment/Plan:  Russell Sampson is a 72 y.o. White or Caucasian  [1] male with  has a past medical history of Arthritis of finger of left hand (07/08/2021), Benign essential tremor, Benign neoplasm of ascending colon, Benign neoplasm of cecum, Benign neoplasm of sigmoid colon, Benign neoplasm of transverse colon, Cataract, Cerumen impaction (08/15/2022), Chicken pox, Difficult airway for intubation (03/10/2019), Effusion of right knee joint (04/11/2020), Elbow pain (08/29/2013), Generalized anxiety disorder with panic attacks (01/30/2011), Greater trochanteric bursitis of right hip (03/21/2020), Heart murmur (12/14/2019), HLD (hyperlipidemia) (12/14/2019), Hyperglycemia (12/15/2020), Low back pain (11/17/2022), Mild cognitive impairment of uncertain or unknown etiology (02/24/2022), Obesity (08/26/2018), Personal history of colonic polyps (10/2007), Posterior neck pain (08/15/2022), Prostate cancer, Right knee pain (08/15/2022), Right lumbar radiculopathy (03/28/2020), Thumb tendonitis (07/21/2020), and Vitamin D deficiency (02/13/2022).  No problem-specific Assessment & Plan notes found for this encounter.  Followup: No follow-ups on file.  Oliver Barre, MD 06/08/2023 10:23 AM [facility]

## 2023-06-08 NOTE — Assessment & Plan Note (Signed)
Much improved with colchcine per rheum Dr Lendon Colonel

## 2023-06-08 NOTE — Patient Instructions (Signed)

## 2023-06-09 ENCOUNTER — Other Ambulatory Visit: Payer: Self-pay

## 2023-06-09 DIAGNOSIS — Z860100 Personal history of colon polyps, unspecified: Secondary | ICD-10-CM

## 2023-06-09 DIAGNOSIS — T884XXD Failed or difficult intubation, subsequent encounter: Secondary | ICD-10-CM

## 2023-06-09 NOTE — Telephone Encounter (Signed)
Pt wife Gavin Pound made aware of recent results and Dr. Leone Payor recommendations: Pt was scheduled for a Previsit appointment on 06/23/2023 at 2:30 PM. Gavin Pound made aware. Location provided. Pt was ordered and scheduled for a Colonoscopy at [facility] on 09/02/2022 at 8:00 AM. Pt made aware. Case ID [id number] Gavin Pound verbalized understanding with all questions answered.   Routed as FYI.

## 2023-06-11 ENCOUNTER — Encounter: Payer: Self-pay | Admitting: Internal Medicine

## 2023-06-11 NOTE — Assessment & Plan Note (Signed)
With mild worsening recently now on aricept, namenda, to f/u neuro as planned

## 2023-06-11 NOTE — Assessment & Plan Note (Signed)
Lab Results  Component Value Date   LDLCALC 68 06/08/2023   Stable, pt to continue current statin crestor 40 mg qd

## 2023-06-11 NOTE — Assessment & Plan Note (Signed)
Lab Results  Component Value Date   HGBA1C 6.0 06/08/2023   Stable, pt to continue current medical treatment  - diet, wt control

## 2023-06-11 NOTE — Assessment & Plan Note (Signed)
Overall stable, cont current med tx - zoloft 100 qd

## 2023-06-11 NOTE — Assessment & Plan Note (Signed)
Last vitamin D Lab Results  Component Value Date   VD25OH 49.46 06/08/2023   Stable, cont oral replacement

## 2023-06-16 ENCOUNTER — Ambulatory Visit: Payer: Medicare Other | Admitting: Behavioral Health

## 2023-06-23 ENCOUNTER — Ambulatory Visit (AMBULATORY_SURGERY_CENTER): Payer: Medicare Other

## 2023-06-23 VITALS — Ht 70.5 in | Wt 188.0 lb

## 2023-06-23 DIAGNOSIS — Z860100 Personal history of colon polyps, unspecified: Secondary | ICD-10-CM

## 2023-06-23 NOTE — Progress Notes (Signed)

## 2023-06-29 ENCOUNTER — Telehealth: Payer: Self-pay | Admitting: Physician Assistant

## 2023-06-29 ENCOUNTER — Other Ambulatory Visit: Payer: Self-pay | Admitting: Physician Assistant

## 2023-06-29 NOTE — Telephone Encounter (Signed)
Caller wanted to leave message for [place]. Caller stated pt is doing better with twice a day and stabilized.

## 2023-06-29 NOTE — Telephone Encounter (Signed)
I advised of the namenda 10mg  bid was called in , he is doing better. Now has stablized.

## 2023-07-07 ENCOUNTER — Ambulatory Visit: Payer: Medicare Other | Admitting: Behavioral Health

## 2023-07-07 DIAGNOSIS — G3184 Mild cognitive impairment, so stated: Secondary | ICD-10-CM

## 2023-07-07 DIAGNOSIS — F411 Generalized anxiety disorder: Secondary | ICD-10-CM | POA: Diagnosis not present

## 2023-07-07 NOTE — Progress Notes (Addendum)
[facility] Counselor/Therapist Progress Note  Patient ID: Russell Sampson, MRN: [id number],    Date: 07/07/2023  Time Spent: 55 min In Person @ [facility] Office Time In: 3:00pm Time Out: 3:55pm   Treatment Type: Family without patient  Reported Symptoms: Elevated anx/dep & stress due to the cont'g decline of Russell's mental status w/MCI impact.   Mental Status Exam: Appearance:  Casual     Behavior: Appropriate, Sharing, and Motivated  Motor: Normal  Speech/Language:  Clear and Coherent  Affect: Appropriate and Tearful  Mood: normal  Thought process: normal  Thought content:   WNL  Sensory/Perceptual disturbances:   WNL  Orientation: oriented to person, place, time/date, and situation  Attention: Good  Concentration: Good  Memory: WNL  Fund of knowledge:  Good  Insight:   Good  Judgment:  Good  Impulse Control: Good   Risk Assessment: Danger to Self:  No Self-injurious Behavior: No Danger to Others: No Duty to Warn:no Physical Aggression / Violence:No  Access to Firearms a concern: No  Gang Involvement:No   Subjective: Russell Sampson is here today for Spousal support & guidance due to her Russell Sampson's inc'g decline w/MCI. She reports he is not processing well, can't fllw directions, & perseverates over different topics throughout the day. This is causing Russell Sampson to have some anxious rxns herself as she adjusts to the terrain of his Dx. Pt's Wife is retired Charity fundraiser & knows how to manage his day to day health status changes overall. The inc in his Sx & decline mentally are causing her irritability as she oversees his situation daily.   Russell Sampson notices the mental decline of her Russell in the past 2-3 mos as more extreme than before. It is hard tfor her to discern if he is listening or to think he is purposefully ignoring her. We teased this apart today & Clinician explained his slower processing & the impact this has on attn & attending.    Interventions: Family Systems; Spousal  support w/normalization & validation of situation w/Russell's MCI Dx  Diagnosis:Mild cognitive impairment of uncertain or unknown etiology  Generalized anxiety disorder  Plan: Wife is here today for Spousal support due to Russell Sampson's decline due to MCI. Pt himself has a significant Family Hx for Alzheimer's Dis. He has 4 Str in their 90's & 2 Bros who have been impacted by Alzheimer's Dis. His Mother is deceased & also carried the Alzheimer's Dx. She is worried for the lack of knowledge his Sonda Primes has as he hopes for Pt to travel to [place] w/him. Wife prefers he not travel by Airplane that far. She cannot manage him well, he will not do well, & the trip will be less than optimum. She will explain this to his Bros using the info provided today.  Target Date: 08/02/2023  Progress: 7  Frequency: Every other visit  Modality: Family w/o the Pt to prevent mental distress We will proceed switching every other session to Spouse Russell Sampson for support/psychoedu & tips for copoing herself. Deneise Lever, LMFT

## 2023-07-07 NOTE — Progress Notes (Signed)
                Russell Sampson L Farryn Linares, LMFT 

## 2023-07-20 ENCOUNTER — Telehealth: Payer: Self-pay | Admitting: Physician Assistant

## 2023-07-20 MED ORDER — DONEPEZIL HCL 10 MG PO TABS: 10.0000 mg | ORAL_TABLET | Freq: Every day | ORAL | 4 refills | Status: DC

## 2023-07-20 NOTE — Telephone Encounter (Signed)
Refill sent in for pt. 

## 2023-07-20 NOTE — Telephone Encounter (Signed)
I advised 10mg  of aricept was sent in pharmacy didn't tell her dosage, She thanked me for calling.

## 2023-07-20 NOTE — Telephone Encounter (Signed)
1. Which medications need refilled? (List name and dosage, if known) donepezil - increased dosage  2. Which pharmacy/location is medication to be sent to? (include street and city if local pharmacy) [facility] [address] Wendie Simmer

## 2023-07-20 NOTE — Telephone Encounter (Signed)
Pt's wife called and left a message. She stated the wrong dosage was called in a few minutes ago for his donepezil. She would like to talk to [facility]

## 2023-07-27 ENCOUNTER — Ambulatory Visit: Payer: Medicare Other | Admitting: Behavioral Health

## 2023-07-27 DIAGNOSIS — F411 Generalized anxiety disorder: Secondary | ICD-10-CM | POA: Diagnosis not present

## 2023-07-27 DIAGNOSIS — G3184 Mild cognitive impairment, so stated: Secondary | ICD-10-CM | POA: Diagnosis not present

## 2023-07-27 NOTE — Progress Notes (Signed)
                Anastasya Jewell L Farryn Linares, LMFT 
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
## 2023-07-28 DIAGNOSIS — Z961 Presence of intraocular lens: Secondary | ICD-10-CM | POA: Diagnosis not present

## 2023-07-28 DIAGNOSIS — D3132 Benign neoplasm of left choroid: Secondary | ICD-10-CM | POA: Diagnosis not present

## 2023-08-24 ENCOUNTER — Encounter (HOSPITAL_COMMUNITY): Payer: Self-pay | Admitting: Internal Medicine

## 2023-08-25 ENCOUNTER — Ambulatory Visit: Payer: Medicare Other | Admitting: Behavioral Health

## 2023-08-25 ENCOUNTER — Ambulatory Visit: Payer: Medicare Other

## 2023-08-25 VITALS — Ht 70.0 in | Wt 188.0 lb

## 2023-08-25 DIAGNOSIS — Z0000 Encounter for general adult medical examination without abnormal findings: Secondary | ICD-10-CM | POA: Diagnosis not present

## 2023-08-25 NOTE — Patient Instructions (Signed)
 Russell Sampson , Thank you for taking time to come for your Medicare Wellness Visit. I appreciate your ongoing commitment to your health goals. Please review the following plan we discussed and let me know if I can assist you in the future.   Referrals/Orders/Follow-Ups/Clinician Recommendations: Keep up the good work.  This is a list of the screening recommended for you and due dates:  Health Maintenance  Topic Date Due   COVID-19 Vaccine (4 - 2024-25 season) 09/10/2023*   Colon Cancer Screening  09/11/2023*   Medicare Annual Wellness Visit  08/24/2024   DTaP/Tdap/Td vaccine (2 - Td or Tdap) 08/04/2027   Pneumonia Vaccine  Completed   Flu Shot  Completed   Hepatitis C Screening  Completed   Zoster (Shingles) Vaccine  Completed   HPV Vaccine  Aged Out  *Topic was postponed. The date shown is not the original due date.    Advanced directives: (Copy Requested) Please bring a copy of your health care power of attorney and living will to the office to be added to your chart at your convenience.  Next Medicare Annual Wellness Visit scheduled for next year: Yes

## 2023-08-25 NOTE — Progress Notes (Signed)
 Subjective:   Russell Sampson is a 73 y.o. male who presents for Medicare Annual/Subsequent preventive examination.  Visit Complete: Virtual I connected with  Russell Sampson on 08/25/23 by a audio enabled telemedicine application and verified that I am speaking with the correct person using two identifiers.  Patient Location: Home  Provider Location: Home Office  I discussed the limitations of evaluation and management by telemedicine. The patient expressed understanding and agreed to proceed.  Vital Signs: Because this visit was a virtual/telehealth visit, some criteria may be missing or patient reported. Any vitals not documented were not able to be obtained and vitals that have been documented are patient reported.  Patient Medicare AWV questionnaire was completed by the patient on 08/18/2023; I have confirmed that all information answered by patient is correct and no changes since this date.        Objective:    Today's Vitals   08/25/23 1133  Weight: 188 lb (85.3 kg)  Height: 5' 10 (1.778 m)   Body mass index is 26.98 kg/m.     08/25/2023   11:39 AM 03/16/2023    2:49 PM 09/18/2022    9:36 AM 05/28/2022    8:48 AM 03/18/2022    2:18 PM 07/16/2021    2:12 PM 05/30/2019    7:30 AM  Advanced Directives  Does Patient Have a Medical Advance Directive? Yes Yes Yes Yes Yes No Yes  Type of Russell Sampson;Living will Healthcare Power of Russell Sampson;Living will   Healthcare Power of Russell Sampson;Living will  Does patient want to make changes to medical advance directive? No - Patient declined No - Patient declined  No - Patient declined     Copy of Healthcare Power of Attorney in Chart? Yes - validated most recent copy scanned in chart (See row information) No - copy requested  Yes - validated most recent copy scanned in chart (See row information)   No - copy requested    Current Medications (verified) Outpatient Encounter  Medications as of 08/25/2023  Medication Sig   acetaminophen  (TYLENOL ) 500 MG tablet Take 1,000 mg by mouth every 6 (six) hours as needed.   colchicine 0.6 MG tablet Take 0.6 mg by mouth daily.   donepezil  (ARICEPT ) 10 MG tablet Take 1 tablet (10 mg total) by mouth daily.   memantine  (NAMENDA ) 10 MG tablet TAKE 1 TABLET BY MOUTH TWICE DAILY   Menthol, Topical Analgesic, (BENGAY EX) Apply 1 application topically daily as needed (muscle pain).   naproxen  (NAPROSYN ) 500 MG tablet Take 1 tablet (500 mg total) by mouth 2 (two) times daily as needed.   rosuvastatin  (CRESTOR ) 40 MG tablet Take 1 tablet (40 mg total) by mouth daily.   sertraline  (ZOLOFT ) 100 MG tablet Take 2 tablets (200 mg total) by mouth daily.   VITAMIN D , CHOLECALCIFEROL, PO Take 2,000 mg by mouth.   No facility-administered encounter medications on file as of 08/25/2023.    Allergies (verified) Patient has no known allergies.   History: Past Medical History:  Diagnosis Date   Arthritis of finger of left hand 07/08/2021   Benign essential tremor    Right-hand   Benign neoplasm of ascending colon    Benign neoplasm of cecum    Benign neoplasm of sigmoid colon    Benign neoplasm of transverse colon    Cataract    bilateral; removed   Cerumen impaction 08/15/2022   Chicken pox    Difficult airway  for intubation 03/10/2019   Effusion of right knee joint 04/11/2020   Elbow pain 08/29/2013   Generalized anxiety disorder with panic attacks 01/30/2011   Greater trochanteric bursitis of right hip 03/21/2020   Heart murmur 12/14/2019   HLD (hyperlipidemia) 12/14/2019   Hyperglycemia 12/15/2020   Low back pain 11/17/2022   Mild cognitive impairment of uncertain or unknown etiology 02/24/2022   Obesity 08/26/2018   Personal history of colonic polyps 10/2007   3-4 small adenomas   Posterior neck pain 08/15/2022   Prostate cancer    Right knee pain 08/15/2022   Right lumbar radiculopathy 03/28/2020   Thumb tendonitis  07/21/2020   Vitamin D  deficiency 02/13/2022   Past Surgical History:  Procedure Laterality Date   CLOSED REDUCTION FOREARM FRACTURE     1962   COLONOSCOPY     COLONOSCOPY W/ POLYPECTOMY  10/22/2007   3-4 adenomas, max 6mm   COLONOSCOPY WITH PROPOFOL  N/A 05/30/2019   Procedure: COLONOSCOPY WITH PROPOFOL ;  Surgeon: Russell Lupita BRAVO, MD;  Location: [facility];  Service: Endoscopy;  Laterality: N/A;   LYMPHADENECTOMY Bilateral 11/10/2016   Procedure: PELVIC LYMPHADENECTOMY;  Surgeon: Russell Ferrara, MD;  Location: [facility];  Service: Urology;  Laterality: Bilateral;   POLYPECTOMY     POLYPECTOMY  05/30/2019   Procedure: POLYPECTOMY;  Surgeon: Russell Lupita BRAVO, MD;  Location: [facility];  Service: Endoscopy;;   PROSTATE BIOPSY     09/19/2016   ROBOT ASSISTED LAPAROSCOPIC RADICAL PROSTATECTOMY N/A 11/10/2016   Procedure: XI ROBOTIC ASSISTED LAPAROSCOPIC RADICAL PROSTATECTOMY LEVEL 2;  Surgeon: Russell Ferrara, MD;  Location: [facility];  Service: Urology;  Laterality: N/A;   Family History  Problem Relation Age of Onset   Alzheimer's disease Mother    Stroke Mother    Prostate cancer Father    Anxiety disorder Sister    Alzheimer's disease Brother 64       early-onset   Alzheimer's disease Maternal Aunt    Alzheimer's disease Other        Maternal aunt's daughter   Colon cancer Neg Hx    Colon polyps Neg Hx    Esophageal cancer Neg Hx    Rectal cancer Neg Hx    Stomach cancer Neg Hx    Social History   Socioeconomic History   Marital status: Married    Spouse name: Russell Sampson   Number of children: 0   Years of education: 16   Highest education level: Bachelor's degree (e.g., BA, AB, BS)  Occupational History   Occupation: Retired  Tobacco Use   Smoking status: Never   Smokeless tobacco: Never  Vaping Use   Vaping status: Never Used  Substance and Sexual Activity   Alcohol use: No   Drug use: No   Sexual activity: Yes    Partners: Female    Comment: Married  Other Topics  Concern   Not on file  Social History Narrative   Fun:    Right handed   One level home   Lives wife and 3 kittens.   Social Drivers of Corporate Investment Banker Strain: Low Risk  (08/25/2023)   Overall Financial Resource Strain (CARDIA)    Difficulty of Paying Living Expenses: Not very hard  Food Insecurity: No Food Insecurity (08/25/2023)   Hunger Vital Sign    Worried About Running Out of Food in the Last Year: Never true    Ran Out of Food in the Last Year: Never true  Transportation Needs: No Transportation Needs (08/25/2023)  PRAPARE - Administrator, Civil Service (Medical): No    Lack of Transportation (Non-Medical): No  Physical Activity: Insufficiently Active (08/25/2023)   Exercise Vital Sign    Days of Exercise per Week: 4 days    Minutes of Exercise per Session: 30 min  Stress: No Stress Concern Present (08/25/2023)   [employer] of Occupational Health - Occupational Stress Questionnaire    Feeling of Stress : Not at all  Social Connections: Socially Integrated (08/25/2023)   Social Connection and Isolation Panel [NHANES]    Frequency of Communication with Friends and Family: Three times a week    Frequency of Social Gatherings with Friends and Family: Once a week    Attends Religious Services: More than 4 times per year    Active Member of [employer] or Organizations: Yes    Attends Banker Meetings: Never    Marital Status: Married    Tobacco Counseling Counseling given: Not Answered   Clinical Intake:  Pre-visit preparation completed: Yes  Pain : No/denies pain     BMI - recorded: 26.98 Nutritional Status: BMI 25 -29 Overweight Nutritional Risks: None Diabetes: No  How often do you need to have someone help you when you read instructions, pamphlets, or other written materials from your doctor or pharmacy?: 1 - Never  Interpreter Needed?: No  Information entered by :: Hever Castilleja, RMA   Activities of Daily Living     08/18/2023    3:57 PM  In your present state of health, do you have any difficulty performing the following activities:  Hearing? 0  Vision? 0  Difficulty concentrating or making decisions? 1  Walking or climbing stairs? 0  Dressing or bathing? 0  Doing errands, shopping? 0  Preparing Food and eating ? N  Using the Toilet? N  In the past six months, have you accidently leaked urine? Y  Do you have problems with loss of bowel control? N  Managing your Finances? N  Housekeeping or managing your Housekeeping? N    Patient Care Team: Norleen Lynwood ORN, MD as PCP - General (Internal Medicine) O'Neal, Darryle Ned, MD as PCP - Cardiology (Cardiology) Georjean Darice HERO, MD as Consulting Physician (Neurology) Pa, [facility] Ophthalmology  Indicate any recent Medical Services you may have received from other than [facility] providers in the past year (date may be approximate).     Assessment:   This is a routine wellness examination for [facility].  Hearing/Vision screen Hearing Screening - Comments:: Denies hearing difficulties   Vision Screening - Comments:: Denies vision issues.    Goals Addressed             This Visit's Progress    Patient Stated   On track    Stay as active as possible.      Depression Screen    08/25/2023   11:46 AM 06/08/2023    9:47 AM 12/02/2022    9:45 AM 08/15/2022   10:02 AM 05/28/2022    8:49 AM 02/13/2022   11:11 AM 12/30/2021    1:37 PM  PHQ 2/9 Scores  PHQ - 2 Score 0 0 1 0 1 0 0  PHQ- 9 Score 0  3 0  0     Fall Risk    08/18/2023    3:57 PM 06/08/2023    9:47 AM 03/16/2023    2:49 PM 12/02/2022    9:45 AM 09/18/2022    9:36 AM  Fall Risk   Falls in the past  year? 0 0 0 0 0  Number falls in past yr: 0 0 0 0 0  Injury with Fall?  0 0 0 0  Risk for fall due to :  No Fall Risks  No Fall Risks   Follow up  Falls evaluation completed Falls evaluation completed Falls evaluation completed Falls evaluation completed    MEDICARE RISK AT HOME: Medicare  Risk at Home Any stairs in or around the home?: (Patient-Rptd) No Home free of loose throw rugs in walkways, pet beds, electrical cords, etc?: (Patient-Rptd) Yes Adequate lighting in your home to reduce risk of falls?: (Patient-Rptd) Yes Life alert?: (Patient-Rptd) No Use of a cane, walker or w/c?: (Patient-Rptd) No Grab bars in the bathroom?: (Patient-Rptd) No Shower chair or bench in shower?: (Patient-Rptd) No Elevated toilet seat or a handicapped toilet?: (Patient-Rptd) No  TIMED UP AND GO:  Was the test performed?  No    Cognitive Function:    09/18/2022   12:00 PM  MMSE - Mini Mental State Exam  Orientation to time 5  Orientation to Place 5  Registration 3  Attention/ Calculation 5  Recall 1  Language- name 2 objects 2  Language- repeat 1  Language- follow 3 step command 3  Language- read & follow direction 1  Write a sentence 1  Copy design 1  Total score 28      02/07/2019    3:00 PM  Montreal Cognitive Assessment   Visuospatial/ Executive (0/5) 4  Naming (0/3) 3  Attention: Read list of digits (0/2) 2  Attention: Read list of letters (0/1) 1  Attention: Serial 7 subtraction starting at 100 (0/3) 3  Language: Repeat phrase (0/2) 1  Language : Fluency (0/1) 1  Abstraction (0/2) 0  Delayed Recall (0/5) 5  Orientation (0/6) 6  Total 26  Adjusted Score (based on education) 26      08/25/2023   11:40 AM 05/28/2022    8:50 AM  6CIT Screen  What Year? 0 points 0 points  What month? 0 points 0 points  What time? 0 points 0 points  Count back from 20 0 points 0 points  Months in reverse 0 points 0 points  Repeat phrase 4 points 0 points  Total Score 4 points 0 points    Immunizations Immunization History  Administered Date(s) Administered   Fluad Quad(high Dose 65+) 07/01/2021, 05/03/2023   Influenza Split 06/05/2012   Influenza, High Dose Seasonal PF 04/05/2019   Influenza,inj,Quad PF,6+ Mos 04/16/2015, 05/12/2017   Influenza-Unspecified 03/26/2016,  04/18/2018, 05/30/2022   Moderna Sars-Covid-2 Vaccination 10/13/2019, 11/11/2019   PFIZER(Purple Top)SARS-COV-2 Vaccination 08/01/2020   Pneumococcal Conjugate-13 07/04/2018   Pneumococcal Polysaccharide-23 06/18/2018   Tdap 08/03/2017   Zoster Recombinant(Shingrix) 07/10/2017, 08/24/2017   Zoster, Live 04/16/2015    TDAP status: Up to date  Flu Vaccine status: Up to date  Pneumococcal vaccine status: Up to date  Covid-19 vaccine status: Declined, Education has been provided regarding the importance of this vaccine but patient still declined. Advised may receive this vaccine at local pharmacy or Health Dept.or vaccine clinic. Aware to provide a copy of the vaccination record if obtained from local pharmacy or Health Dept. Verbalized acceptance and understanding.  Qualifies for Shingles Vaccine? Yes   Zostavax completed Yes   Shingrix Completed?: Yes  Screening Tests Health Maintenance  Topic Date Due   COVID-19 Vaccine (4 - 2024-25 season) 09/10/2023 (Originally 04/19/2023)   Colonoscopy  09/11/2023 (Originally 05/30/2023)   Medicare Annual Wellness (AWV)  08/24/2024  DTaP/Tdap/Td (2 - Td or Tdap) 08/04/2027   Pneumonia Vaccine 80+ Years old  Completed   INFLUENZA VACCINE  Completed   Hepatitis C Screening  Completed   Zoster Vaccines- Shingrix  Completed   HPV VACCINES  Aged Out    Health Maintenance  There are no preventive care reminders to display for this patient.   Colorectal cancer screening: Referral to GI placed 06/23/2023. Pt aware the office will call re: appt.  Lung Cancer Screening: (Low Dose CT Chest recommended if Age 1-80 years, 20 pack-year currently smoking OR have quit w/in 15years.) does not qualify.   Lung Cancer Screening Referral: n/a  Additional Screening:  Hepatitis C Screening: does qualify; Completed 08/03/2017  Vision Screening: Recommended annual ophthalmology exams for early detection of glaucoma and other disorders of the eye. Is the  patient up to date with their annual eye exam?  Yes  Who is the provider or what is the name of the office in which the patient attends annual eye exams? [facility] If pt is not established with a provider, would they like to be referred to a provider to establish care? No .   Dental Screening: Recommended annual dental exams for proper oral hygiene   Community Resource Referral / Chronic Care Management: CRR required this visit?  No   CCM required this visit?  No     Plan:     I have personally reviewed and noted the following in the patient's chart:   Medical and social history Use of alcohol, tobacco or illicit drugs  Current medications and supplements including opioid prescriptions. Patient is not currently taking opioid prescriptions. Functional ability and status Nutritional status Physical activity Advanced directives List of other physicians Hospitalizations, surgeries, and ER visits in previous 12 months Vitals Screenings to include cognitive, depression, and falls Referrals and appointments  In addition, I have reviewed and discussed with patient certain preventive protocols, quality metrics, and best practice recommendations. A written personalized care plan for preventive services as well as general preventive health recommendations were provided to patient.     Jeramine Delis L Dedrick Heffner, CMA   08/25/2023   After Visit Summary: (MyChart) Due to this being a telephonic visit, the after visit summary with patients personalized plan was offered to patient via MyChart   Nurse Notes: Patient is due for a colonoscopy, which he stated that he is scheduled to have on 09/03/2023.  He is up to date on all other health maintenance with no concerns to address today.

## 2023-08-31 ENCOUNTER — Telehealth: Payer: Self-pay | Admitting: Internal Medicine

## 2023-08-31 NOTE — Telephone Encounter (Signed)
 Patient's wife called stating he's having respiratory problems from a cold has a procedure scheduled for 09/03/2023 at the hospital. Please advise.

## 2023-08-31 NOTE — Telephone Encounter (Signed)
 Spoke with patients wife. Reports patient is feeling much better from last week with some lingering congestion and an occ cough. Pt does not have any SOB, fever, diarrhea, fever chills. Pt ok to proceed as scheduled. Pt advised to call if he gets worse.

## 2023-09-02 NOTE — Anesthesia Preprocedure Evaluation (Addendum)
Anesthesia Evaluation  Patient identified by MRN, date of birth, ID band Patient awake    Reviewed: Allergy & Precautions, NPO status , Patient's Chart, lab work & pertinent test results  History of Anesthesia Complications (+) DIFFICULT AIRWAY and history of anesthetic complications  Airway Mallampati: II  TM Distance: >3 FB Neck ROM: Full    Dental no notable dental hx. (+) Teeth Intact, Dental Advisory Given   Pulmonary    Pulmonary exam normal breath sounds clear to auscultation       Cardiovascular hypertension, + CAD  Normal cardiovascular exam Rhythm:Regular Rate:Normal  03/2022 TTE 1. Left ventricular ejection fraction by 3D volume is 56 %. The left  ventricle has normal function. The left ventricle has no regional wall  motion abnormalities. Left ventricular diastolic parameters are consistent  with Grade I diastolic dysfunction  (impaired relaxation).   2. Right ventricular systolic function is normal. The right ventricular  size is normal. There is normal pulmonary artery systolic pressure.   3. The mitral valve is normal in structure. Mild to moderate mitral valve  regurgitation. No evidence of mitral stenosis.   4. The aortic valve is tricuspid. Aortic valve regurgitation is not  visualized. Aortic valve sclerosis is present, with no evidence of aortic  valve stenosis.   5. There is borderline dilatation of the ascending aorta, measuring 37  mm.   6. The inferior vena cava is normal in size with greater than 50%  respiratory variability, suggesting right atrial pressure of 3 mmHg.      Neuro/Psych  PSYCHIATRIC DISORDERS Anxiety        GI/Hepatic   Endo/Other    Renal/GU      Musculoskeletal  (+) Arthritis ,    Abdominal   Peds  Hematology   Anesthesia Other Findings   Reproductive/Obstetrics                             Anesthesia Physical Anesthesia Plan  ASA:  2  Anesthesia Plan: MAC   Post-op Pain Management: Minimal or no pain anticipated   Induction:   PONV Risk Score and Plan: Propofol infusion and Treatment may vary due to age or medical condition  Airway Management Planned: Natural Airway and Simple Face Mask  Additional Equipment: None  Intra-op Plan:   Post-operative Plan:   Informed Consent: I have reviewed the patients History and Physical, chart, labs and discussed the procedure including the risks, benefits and alternatives for the proposed anesthesia with the patient or authorized representative who has indicated his/her understanding and acceptance.     Dental advisory given  Plan Discussed with: CRNA and Anesthesiologist  Anesthesia Plan Comments: (Hx of colon polyps for colonoscopy)        Anesthesia Quick Evaluation

## 2023-09-03 ENCOUNTER — Ambulatory Visit (HOSPITAL_COMMUNITY): Payer: Self-pay | Admitting: Anesthesiology

## 2023-09-03 ENCOUNTER — Encounter (HOSPITAL_COMMUNITY)
Admission: RE | Disposition: A | Discharge status: Home / Self Care | Payer: Self-pay | Source: Ambulatory Visit | Attending: Internal Medicine

## 2023-09-03 ENCOUNTER — Other Ambulatory Visit: Payer: Self-pay

## 2023-09-03 ENCOUNTER — Ambulatory Visit (HOSPITAL_COMMUNITY)
Admission: RE | Admit: 2023-09-03 | Discharge: 2023-09-03 | Disposition: A | Discharge status: Home / Self Care | Payer: Medicare Other | Source: Ambulatory Visit | Attending: Internal Medicine | Admitting: Internal Medicine

## 2023-09-03 ENCOUNTER — Encounter (HOSPITAL_COMMUNITY): Payer: Self-pay | Admitting: Internal Medicine

## 2023-09-03 DIAGNOSIS — Z860100 Personal history of colon polyps, unspecified: Secondary | ICD-10-CM | POA: Diagnosis not present

## 2023-09-03 DIAGNOSIS — F419 Anxiety disorder, unspecified: Secondary | ICD-10-CM | POA: Diagnosis not present

## 2023-09-03 DIAGNOSIS — I358 Other nonrheumatic aortic valve disorders: Secondary | ICD-10-CM | POA: Insufficient documentation

## 2023-09-03 DIAGNOSIS — I10 Essential (primary) hypertension: Secondary | ICD-10-CM | POA: Insufficient documentation

## 2023-09-03 DIAGNOSIS — D120 Benign neoplasm of cecum: Secondary | ICD-10-CM | POA: Diagnosis not present

## 2023-09-03 DIAGNOSIS — I2510 Atherosclerotic heart disease of native coronary artery without angina pectoris: Secondary | ICD-10-CM | POA: Diagnosis not present

## 2023-09-03 DIAGNOSIS — D122 Benign neoplasm of ascending colon: Secondary | ICD-10-CM | POA: Insufficient documentation

## 2023-09-03 DIAGNOSIS — Z1211 Encounter for screening for malignant neoplasm of colon: Secondary | ICD-10-CM | POA: Insufficient documentation

## 2023-09-03 DIAGNOSIS — M1990 Unspecified osteoarthritis, unspecified site: Secondary | ICD-10-CM | POA: Insufficient documentation

## 2023-09-03 DIAGNOSIS — T884XXD Failed or difficult intubation, subsequent encounter: Secondary | ICD-10-CM

## 2023-09-03 DIAGNOSIS — Z860101 Personal history of adenomatous and serrated colon polyps: Secondary | ICD-10-CM

## 2023-09-03 DIAGNOSIS — K635 Polyp of colon: Secondary | ICD-10-CM | POA: Diagnosis not present

## 2023-09-03 HISTORY — PX: COLONOSCOPY WITH PROPOFOL: SHX5780

## 2023-09-03 HISTORY — PX: POLYPECTOMY: SHX5525

## 2023-09-03 SURGERY — COLONOSCOPY WITH PROPOFOL
Anesthesia: Monitor Anesthesia Care

## 2023-09-03 MED ORDER — LACTATED RINGERS IV SOLN: INTRAVENOUS | Status: DC | PRN

## 2023-09-03 MED ORDER — SODIUM CHLORIDE 0.9 % IV SOLN: INTRAVENOUS | Status: DC

## 2023-09-03 MED ORDER — PROPOFOL 1000 MG/100ML IV EMUL
INTRAVENOUS | Status: AC
  Filled 2023-09-03: qty 100

## 2023-09-03 MED ORDER — PROPOFOL 500 MG/50ML IV EMUL
INTRAVENOUS | Status: DC | PRN
  Administered 2023-09-03: 175 ug/kg/min via INTRAVENOUS

## 2023-09-03 SURGICAL SUPPLY — 21 items
ELECT REM PT RETURN 9FT ADLT (ELECTROSURGICAL) IMPLANT
ELECTRODE REM PT RTRN 9FT ADLT (ELECTROSURGICAL) IMPLANT
FCP BXJMBJMB 240X2.8X (CUTTING FORCEPS)
FLOOR PAD 36X40 (MISCELLANEOUS) ×2 IMPLANT
FORCEPS BIOP RAD 4 LRG CAP 4 (CUTTING FORCEPS) IMPLANT
FORCEPS BIOP RJ4 240 W/NDL (CUTTING FORCEPS) IMPLANT
FORCEPS BXJMBJMB 240X2.8X (CUTTING FORCEPS) IMPLANT
INJECTOR/SNARE I SNARE (MISCELLANEOUS) IMPLANT
LUBRICANT JELLY 4.5OZ STERILE (MISCELLANEOUS) IMPLANT
MANIFOLD NEPTUNE II (INSTRUMENTS) IMPLANT
NDL SCLEROTHERAPY 25GX240 (NEEDLE) IMPLANT
NEEDLE SCLEROTHERAPY 25GX240 (NEEDLE) IMPLANT
PAD FLOOR 36X40 (MISCELLANEOUS) ×2 IMPLANT
PROBE APC STR FIRE (PROBE) IMPLANT
PROBE INJECTION GOLD 7FR (MISCELLANEOUS) IMPLANT
SNARE ROTATE MED OVAL 20MM (MISCELLANEOUS) IMPLANT
SYR 50ML LL SCALE MARK (SYRINGE) IMPLANT
TRAP SPECIMEN MUCOUS 40CC (MISCELLANEOUS) IMPLANT
TUBING ENDO SMARTCAP PENTAX (MISCELLANEOUS) IMPLANT
TUBING IRRIGATION ENDOGATOR (MISCELLANEOUS) ×2 IMPLANT
WATER STERILE IRR 1000ML POUR (IV SOLUTION) IMPLANT

## 2023-09-03 NOTE — H&P (Addendum)
[facility] History and Physical   Primary Care Physician:  Corwin Levins, MD   Reason for Procedure:   Hx colon polyps  Plan:    colonoscopy     HPI: Russell Sampson is a 73 y.o. male presenting for a surveillance colonoscopy today. 05/30/2019 was last exam  3-4 small adenomas 2009 and 2012 overall 03/2016 3 adenomas (small) recall 05/30/2019 4 polyps max 4 mm adenomas   Past Medical History:  Diagnosis Date   Arthritis of finger of left hand 07/08/2021   Benign essential tremor    Right-hand   Benign neoplasm of ascending colon    Benign neoplasm of cecum    Benign neoplasm of sigmoid colon    Benign neoplasm of transverse colon    Cataract    bilateral; removed   Cerumen impaction 08/15/2022   Chicken pox    Difficult airway for intubation 03/10/2019   Effusion of right knee joint 04/11/2020   Elbow pain 08/29/2013   Generalized anxiety disorder with panic attacks 01/30/2011   Greater trochanteric bursitis of right hip 03/21/2020   Heart murmur 12/14/2019   HLD (hyperlipidemia) 12/14/2019   Hyperglycemia 12/15/2020   Low back pain 11/17/2022   Mild cognitive impairment of uncertain or unknown etiology 02/24/2022   Obesity 08/26/2018   Personal history of colonic polyps 10/2007   3-4 small adenomas   Posterior neck pain 08/15/2022   Prostate cancer    Right knee pain 08/15/2022   Right lumbar radiculopathy 03/28/2020   Thumb tendonitis 07/21/2020   Vitamin D deficiency 02/13/2022    Past Surgical History:  Procedure Laterality Date   CLOSED REDUCTION FOREARM FRACTURE     1962   COLONOSCOPY     COLONOSCOPY W/ POLYPECTOMY  10/22/2007   3-4 adenomas, max 6mm   COLONOSCOPY WITH PROPOFOL N/A 05/30/2019   Procedure: COLONOSCOPY WITH PROPOFOL;  Surgeon: Iva Boop, MD;  Location: [facility];  Service: Endoscopy;  Laterality: N/A;   LYMPHADENECTOMY Bilateral 11/10/2016   Procedure: PELVIC LYMPHADENECTOMY;  Surgeon: Heloise Purpura, MD;   Location: [facility];  Service: Urology;  Laterality: Bilateral;   POLYPECTOMY     POLYPECTOMY  05/30/2019   Procedure: POLYPECTOMY;  Surgeon: Iva Boop, MD;  Location: [facility];  Service: Endoscopy;;   PROSTATE BIOPSY     09/19/2016   ROBOT ASSISTED LAPAROSCOPIC RADICAL PROSTATECTOMY N/A 11/10/2016   Procedure: XI ROBOTIC ASSISTED LAPAROSCOPIC RADICAL PROSTATECTOMY LEVEL 2;  Surgeon: Heloise Purpura, MD;  Location: [facility];  Service: Urology;  Laterality: N/A;    Prior to Admission medications   Medication Sig Start Date End Date Taking? Authorizing Provider  acetaminophen (TYLENOL) 500 MG tablet Take 1,000 mg by mouth every 6 (six) hours as needed.   Yes [provider]  colchicine 0.6 MG tablet Take 0.6 mg by mouth daily.   Yes [provider]  donepezil (ARICEPT) 10 MG tablet Take 1 tablet (10 mg total) by mouth daily. 07/20/23  Yes Marcos Eke, PA-C  memantine (NAMENDA) 10 MG tablet TAKE 1 TABLET BY MOUTH TWICE DAILY 06/29/23  Yes Gwynneth Munson, Sung Amabile, PA-C  naproxen (NAPROSYN) 500 MG tablet Take 1 tablet (500 mg total) by mouth 2 (two) times daily as needed. 07/03/21  Yes Corwin Levins, MD  rosuvastatin (CRESTOR) 40 MG tablet Take 1 tablet (40 mg total) by mouth daily. 03/31/23  Yes Jodelle Gross, NP  sertraline (ZOLOFT) 100 MG tablet Take 2 tablets (200 mg total) by mouth daily. 12/02/22  Yes Corwin Levins, MD  VITAMIN D, CHOLECALCIFEROL, PO Take 2,000 mg by mouth.   Yes [provider]  Menthol, Topical Analgesic, (BENGAY EX) Apply 1 application topically daily as needed (muscle pain).    [provider]    Current Facility-Administered Medications  Medication Dose Route Frequency Provider Last Rate Last Admin   0.9 %  sodium chloride infusion   Intravenous Continuous Iva Boop, MD        Allergies as of 06/09/2023   (No Known Allergies)    Family History  Problem Relation Age of Onset   Alzheimer's disease Mother    Stroke  Mother    Prostate cancer Father    Anxiety disorder Sister    Alzheimer's disease Brother 92       early-onset   Alzheimer's disease Maternal Aunt    Alzheimer's disease Other        Maternal aunt's daughter   Colon cancer Neg Hx    Colon polyps Neg Hx    Esophageal cancer Neg Hx    Rectal cancer Neg Hx    Stomach cancer Neg Hx     Social History   Socioeconomic History   Marital status: Married    Spouse name: [name]   Number of children: 0   Years of education: 16   Highest education level: Bachelor's degree (e.g., BA, AB, BS)  Occupational History   Occupation: Retired  Tobacco Use   Smoking status: Never   Smokeless tobacco: Never  Vaping Use   Vaping status: Never Used  Substance and Sexual Activity   Alcohol use: No   Drug use: No   Sexual activity: Yes    Partners: Female    Comment: Married  Other Topics Concern   Not on file  Social History Narrative   Fun:    Right handed   One level home   Lives wife and 3 kittens.   Social Drivers of Corporate investment banker Strain: Low Risk  (08/25/2023)   Overall Financial Resource Strain (CARDIA)    Difficulty of Paying Living Expenses: Not very hard  Food Insecurity: No Food Insecurity (08/25/2023)   Hunger Vital Sign    Worried About Running Out of Food in the Last Year: Never true    Ran Out of Food in the Last Year: Never true  Transportation Needs: No Transportation Needs (08/25/2023)   PRAPARE - Administrator, Civil Service (Medical): No    Lack of Transportation (Non-Medical): No  Physical Activity: Insufficiently Active (08/25/2023)   Exercise Vital Sign    Days of Exercise per Week: 4 days    Minutes of Exercise per Session: 30 min  Stress: No Stress Concern Present (08/25/2023)   [employer] of Occupational Health - Occupational Stress Questionnaire    Feeling of Stress : Not at all  Social Connections: Socially Integrated (08/25/2023)   Social Connection and Isolation Panel [NHANES]     Frequency of Communication with Friends and Family: Three times a week    Frequency of Social Gatherings with Friends and Family: Once a week    Attends Religious Services: More than 4 times per year    Active Member of [employer] or Organizations: Yes    Attends Banker Meetings: Never    Marital Status: Married  Catering manager Violence: Not At Risk (08/25/2023)   Humiliation, Afraid, Rape, and Kick questionnaire    Fear of Current or Ex-Partner: No    Emotionally Abused:  No    Physically Abused: No    Sexually Abused: No    Review of Systems:  All other review of systems negative except as mentioned in the HPI.  Physical Exam: Vital signs BP (!) 151/75   Pulse 65   Temp 98.2 F (36.8 C) (Temporal)   Resp 12   Ht 5\' 10"  (1.778 m)   Wt 79.4 kg   SpO2 98%   BMI 25.11 kg/m   General:   Alert,  Well-developed, well-nourished, pleasant and cooperative in NAD Lungs:  Clear throughout to auscultation.   Heart:  Regular rate and rhythm; no murmurs, clicks, rubs,  or gallops. Abdomen:  Soft, nontender and nondistended. Normal bowel sounds.   Neuro/Psych:  Alert and cooperative. Normal mood and affect. A and O x 3   @Charlette Hennings  Sena Slate, MD, [facility] [phone] (pager) 09/03/2023 7:53 AM@

## 2023-09-03 NOTE — Op Note (Signed)
[facility] Patient Name: Russell Sampson Procedure Date: 09/03/2023 MRN: [id number] Attending MD: Iva Boop , MD, [id number] Date of Birth: [birth year] CSN: [id number] Age: 73 Admit Type: Outpatient Procedure:                Colonoscopy Indications:              Surveillance: Personal history of adenomatous                            polyps on last colonoscopy > 3 years ago, Last                            colonoscopy: October 2020 Providers:                Iva Boop, MD, Suzy Bouchard, RN, Rozetta Nunnery, Technician Referring MD:              Medicines:                Monitored Anesthesia Care Complications:            No immediate complications. Estimated Blood Loss:     Estimated blood loss was minimal. Procedure:                Pre-Anesthesia Assessment:                           - Prior to the procedure, a History and Physical                            was performed, and patient medications and                            allergies were reviewed. The patient's tolerance of                            previous anesthesia was also reviewed. The risks                            and benefits of the procedure and the sedation                            options and risks were discussed with the patient.                            All questions were answered, and informed consent                            was obtained. Prior Anticoagulants: The patient has                            taken no anticoagulant or antiplatelet agents. ASA  Grade Assessment: II - A patient with mild systemic                            disease. After reviewing the risks and benefits,                            the patient was deemed in satisfactory condition to                            undergo the procedure.                           After obtaining informed consent, the colonoscope                            was passed under direct  vision. Throughout the                            procedure, the patient's blood pressure, pulse, and                            oxygen saturations were monitored continuously. The                            CF-HQ190L ([id number]) Olympus colonoscope was                            introduced through the anus and advanced to the the                            cecum, identified by appendiceal orifice and                            ileocecal valve. The colonoscopy was performed                            without difficulty. The patient tolerated the                            procedure well. The quality of the bowel                            preparation was excellent. The ileocecal valve,                            appendiceal orifice, and rectum were photographed.                            The bowel preparation used was Miralax via split                            dose instruction. Scope In: 8:06:37 AM Scope Out: 8:22:00 AM Scope Withdrawal Time: 0 hours 12 minutes 16 seconds  Total Procedure Duration: 0 hours 15 minutes 23 seconds  Findings:      The perianal and digital rectal examinations were normal.      Three sessile polyps were found in the ascending colon and cecum. The       polyps were diminutive in size. These polyps were removed with a cold       snare. Resection and retrieval were complete. Verification of patient       identification for the specimen was done. Estimated blood loss was       minimal.      The exam was otherwise without abnormality on direct and retroflexion       views. Impression:               - Three diminutive polyps in the ascending colon                            and in the cecum, removed with a cold snare.                            Resected and retrieved.                           - The examination was otherwise normal on direct                            and retroflexion views.                           - Personal history of colonic polyps. 3-4  small                            adenomas 2009 and 2012 overall                           03/2016 3 adenomas (small) recall                           05/30/2019 4 polyps max 4 mm adenomas Moderate Sedation:      Not Applicable - Patient had care per Anesthesia. Recommendation:           - Patient has a contact number available for                            emergencies. The signs and symptoms of potential                            delayed complications were discussed with the                            patient. Return to normal activities tomorrow.                            Written discharge instructions were provided to the                            patient.                           -  Resume previous diet.                           - Continue present medications.                           - Await pathology results.                           - Repeat colonoscopy in 5 years for surveillance.                            Procedures done at hospital due to history of                            difficult airway. Procedure Code(s):        --- Professional ---                           [phone], Colonoscopy, flexible; with removal of                            tumor(s), polyp(s), or other lesion(s) by snare                            technique Diagnosis Code(s):        --- Professional ---                           Z86.010, Personal history of colonic polyps                           D12.2, Benign neoplasm of ascending colon                           D12.0, Benign neoplasm of cecum CPT copyright 2022 American Medical Association. All rights reserved. The codes documented in this report are preliminary and upon coder review may  be revised to meet current compliance requirements. Iva Boop, MD 09/03/2023 8:36:24 AM This report has been signed electronically. Number of Addenda: 0

## 2023-09-03 NOTE — Discharge Instructions (Addendum)
I found and removed 3 small polyps today.  I will let you know pathology results and when to have another routine colonoscopy by mail and/or My Chart.  The vitamin D was listed as not taking so I discontinued it (the dose was repiorted to be very high so ? If it was 2000 international units and not milligrams. You can clarify with Dr. Jonny Ruiz if necessary.  I appreciate the opportunity to care for you. Iva Boop, MD, FACG  YOU HAD AN ENDOSCOPIC PROCEDURE TODAY: Refer to the procedure report and other information in the discharge instructions given to you for any specific questions about what was found during the examination. If this information does not answer your questions, please call Dr. Marvell Fuller office at [phone] to clarify.   YOU SHOULD EXPECT: Some feelings of bloating in the abdomen. Passage of more gas than usual. Walking can help get rid of the air that was put into your GI tract during the procedure and reduce the bloating. If you had a lower endoscopy (such as a colonoscopy or flexible sigmoidoscopy) you may notice spotting of blood in your stool or on the toilet paper. Some abdominal soreness may be present for a day or two, also.  DIET: Your first meal following the procedure should be a light meal and then it is ok to progress to your normal diet. A half-sandwich or bowl of soup is an example of a good first meal. Heavy or fried foods are harder to digest and may make you feel nauseous or bloated. Drink plenty of fluids but you should avoid alcoholic beverages for 24 hours.   ACTIVITY: Your care partner should take you home directly after the procedure. You should plan to take it easy, moving slowly for the rest of the day. You can resume normal activity the day after the procedure however YOU SHOULD NOT DRIVE, use power tools, machinery or perform tasks that involve climbing or major physical exertion for 24 hours (because of the sedation medicines used during the test).    SYMPTOMS TO REPORT IMMEDIATELY: A gastroenterologist can be reached at any hour. Please call [phone]  for any of the following symptoms:  Following lower endoscopy (colonoscopy, flexible sigmoidoscopy) Excessive amounts of blood in the stool  Significant tenderness, worsening of abdominal pains  Swelling of the abdomen that is new, acute  Fever of 100 or higher    FOLLOW UP:  If any biopsies were taken you will be contacted by phone or by letter within the next 1-3 weeks. Call [phone]  if you have not heard about the biopsies in 3 weeks.  Please also call with any specific questions about appointments or follow up tests.

## 2023-09-03 NOTE — Anesthesia Postprocedure Evaluation (Signed)
Anesthesia Post Note  Patient: Russell Sampson  Procedure(s) Performed: COLONOSCOPY WITH PROPOFOL POLYPECTOMY     Patient location during evaluation: Endoscopy Anesthesia Type: MAC Level of consciousness: awake and alert Pain management: pain level controlled Vital Signs Assessment: post-procedure vital signs reviewed and stable Respiratory status: spontaneous breathing, nonlabored ventilation, respiratory function stable and patient connected to nasal cannula oxygen Cardiovascular status: blood pressure returned to baseline and stable Postop Assessment: no apparent nausea or vomiting Anesthetic complications: no   No notable events documented.  Last Vitals:  Vitals:   09/03/23 0850 09/03/23 0900  BP: (!) 145/76 (!) 153/80  Pulse: 64 (!) 57  Resp: 12 13  Temp:    SpO2: 99% 99%    Last Pain:  Vitals:   09/03/23 0900  TempSrc:   PainSc: 0-No pain                 Trevor Iha

## 2023-09-03 NOTE — Transfer of Care (Signed)
Immediate Anesthesia Transfer of Care Note  Patient: Russell Sampson  Procedure(s) Performed: COLONOSCOPY WITH PROPOFOL POLYPECTOMY  Patient Location: PACU  Anesthesia Type:MAC  Level of Consciousness: sedated  Airway & Oxygen Therapy: Patient Spontanous Breathing  Post-op Assessment: Report given to RN and Post -op Vital signs reviewed and stable  Post vital signs: Reviewed and stable  Last Vitals:  Vitals Value Taken Time  BP 118/57 09/03/23 0830  Temp    Pulse 59 09/03/23 0830  Resp 15 09/03/23 0830  SpO2 100 % 09/03/23 0830  Vitals shown include unfiled device data.  Last Pain:  Vitals:   09/03/23 0654  TempSrc: Temporal  PainSc: 0-No pain         Complications: No notable events documented.

## 2023-09-04 LAB — SURGICAL PATHOLOGY

## 2023-09-06 ENCOUNTER — Encounter (HOSPITAL_COMMUNITY): Payer: Self-pay | Admitting: Internal Medicine

## 2023-09-08 ENCOUNTER — Ambulatory Visit (INDEPENDENT_AMBULATORY_CARE_PROVIDER_SITE_OTHER): Payer: Medicare Other | Admitting: Behavioral Health

## 2023-09-08 ENCOUNTER — Encounter: Payer: Self-pay | Admitting: Internal Medicine

## 2023-09-08 DIAGNOSIS — F411 Generalized anxiety disorder: Secondary | ICD-10-CM | POA: Diagnosis not present

## 2023-09-08 DIAGNOSIS — G3184 Mild cognitive impairment, so stated: Secondary | ICD-10-CM | POA: Diagnosis not present

## 2023-09-08 NOTE — Progress Notes (Addendum)
[facility] Counselor/Therapist Progress Note  Patient ID: Russell Sampson, MRN: [id number],    Date: 09/08/2023  Time Spent: 55 min In Person  @[facility]  - [facility] Office Time In: 11:00am Time Out: 11:55am   Treatment Type:  Cpl Th  Reported Symptoms: Elevated anx/dep & stress due to Pt's perseveration re: his time @ [employer] & his volume of knowledge about history he relates to current events. He is reviewing the deaths of his Parents & his Siblings & their health status changes.   Mental Status Exam: Appearance:  Casual     Behavior: Appropriate and Sharing  Motor: Normal  Speech/Language:  Clear and Coherent  Affect: Depressed  Mood: anxious  Thought process: loose associations  Thought content:   Pt is unable to reconcile thoughts w/the past  Sensory/Perceptual disturbances:   WNL  Orientation: oriented to person, place, and situation  Attention: Good  Concentration: Good  Memory: Dx of MCI  Fund of knowledge:  Good  Insight:   Poor  Judgment:  Fair  Impulse Control: Fair   Risk Assessment: Danger to Self:  No; unless driving becomes an issue  Self-injurious Behavior: No Danger to Others: No Duty to Warn:no Physical Aggression / Violence:No  Access to Firearms a concern: No  Gang Involvement:No   Subjective: Pt perseverative tendencies directed more today as he returns to his time @ [employer] to explore his exp's. He is also preoccupied w/the Anniversary death of his older Russell Sampson one year ago next wk from Alzheimer's. His Dtr also died prior to Christmas of Alzheimer's; she was in her late 71's w/2 children.   Pt has a f/u appt w/Russell Gwynneth Munson, PA-C on 09/17/2023.  Pt is fixated on his College Edu @ [employer]. 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 His Parents raised him & Siblings in a sheltered home environment & this caused him to be averse to the setting in the Dorm where it was similar to the movie, '[employer]'. He drove home every wknd to escape the  chaos.   Pt's Parent's deaths were difficult.  Interventions: Ego-Supportive and Cpl support for trajectory of MCI  Diagnosis:Mild cognitive impairment of uncertain or unknown etiology  Generalized anxiety disorder  Plan: Wife Russell Sampson is using distraction techniques suggested in psychotherapy & redirection of conversation to keep Pt focused. This is helping moderately. She has traced Pt's Family Tree back to 1495. This history is helping to interest & calm Russell Sampson. They will cont to use the strategies offered in sessions & report back on his progress & Russell Sampson's frustration tolerance next visit.  Target Date: 10/03/2023  Progress: 5  Frequency: Once every 2-3 wks  Modality: Cpl Th  Deneise Lever, LMFT

## 2023-09-08 NOTE — Progress Notes (Signed)
                Anastasya Jewell L Farryn Linares, LMFT 

## 2023-09-17 ENCOUNTER — Encounter: Payer: Self-pay | Admitting: Physician Assistant

## 2023-09-17 ENCOUNTER — Ambulatory Visit (INDEPENDENT_AMBULATORY_CARE_PROVIDER_SITE_OTHER): Payer: Medicare Other | Admitting: Physician Assistant

## 2023-09-17 VITALS — BP 139/75 | HR 77 | Resp 20 | Wt 187.0 lb

## 2023-09-17 DIAGNOSIS — G3184 Mild cognitive impairment, so stated: Secondary | ICD-10-CM | POA: Diagnosis not present
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
## 2023-09-17 NOTE — Patient Instructions (Signed)
It was a pleasure to see you today at our office.   Recommendations:  Follow up in 6 months  Continue memantine 10 mg twice daily Start Donepezil 10 mg daily  Recommend to continue psychotherapy  Repeat neuropsych evaluation    RECOMMENDATIONS FOR ALL PATIENTS WITH MEMORY PROBLEMS: 1. Continue to exercise (Recommend 30 minutes of walking everyday, or 3 hours every week) 2. Increase social interactions - continue going to [place] and enjoy social gatherings with friends and family 3. Eat healthy, avoid fried foods and eat more fruits and vegetables 4. Maintain adequate blood pressure, blood sugar, and blood cholesterol level. Reducing the risk of stroke and cardiovascular disease also helps promoting better memory. 5. Avoid stressful situations. Live a simple life and avoid aggravations. Organize your time and prepare for the next day in anticipation. 6. Sleep well, avoid any interruptions of sleep and avoid any distractions in the bedroom that may interfere with adequate sleep quality 7. Avoid sugar, avoid sweets as there is a strong link between excessive sugar intake, diabetes, and cognitive impairment We discussed the Mediterranean diet, which has been shown to help patients reduce the risk of progressive memory disorders and reduces cardiovascular risk. This includes eating fish, eat fruits and green leafy vegetables, nuts like almonds and hazelnuts, walnuts, and also use olive oil. Avoid fast foods and fried foods as much as possible. Avoid sweets and sugar as sugar use has been linked to worsening of memory function.  There is always a concern of gradual progression of memory problems. If this is the case, then we may need to adjust level of care according to patient needs. Support, both to the patient and caregiver, should then be put into place.      You have been referred for a neuropsychological evaluation (i.e., evaluation of memory and thinking abilities). Please bring someone  with you to this appointment if possible, as it is helpful for the doctor to hear from both you and another adult who knows you well. Please bring eyeglasses and hearing aids if you wear them.    The evaluation will take approximately 3 hours and has two parts:   The first part is a clinical interview with the neuropsychologist (Dr. Milbert Coulter or Dr. Roseanne Reno). During the interview, the neuropsychologist will speak with you and the individual you brought to the appointment.    The second part of the evaluation is testing with the doctor's technician Annabelle Harman or Selena Batten). During the testing, the technician will ask you to remember different types of material, solve problems, and answer some questionnaires. Your family member will not be present for this portion of the evaluation.   Please note: We must reserve several hours of the neuropsychologist's time and the psychometrician's time for your evaluation appointment. As such, there is a No-Show fee of $100. If you are unable to attend any of your appointments, please contact our office as soon as possible to reschedule.    FALL PRECAUTIONS: Be cautious when walking. Scan the area for obstacles that may increase the risk of trips and falls. When getting up in the mornings, sit up at the edge of the bed for a few minutes before getting out of bed. Consider elevating the bed at the head end to avoid drop of blood pressure when getting up. Walk always in a well-lit room (use night lights in the walls). Avoid area rugs or power cords from appliances in the middle of the walkways. Use a walker or a cane if necessary  and consider physical therapy for balance exercise. Get your eyesight checked regularly.  FINANCIAL OVERSIGHT: Supervision, especially oversight when making financial decisions or transactions is also recommended.  HOME SAFETY: Consider the safety of the kitchen when operating appliances like stoves, microwave oven, and blender. Consider having supervision  and share cooking responsibilities until no longer able to participate in those. Accidents with firearms and other hazards in the house should be identified and addressed as well.   ABILITY TO BE LEFT ALONE: If patient is unable to contact 911 operator, consider using LifeLine, or when the need is there, arrange for someone to stay with patients. Smoking is a fire hazard, consider supervision or cessation. Risk of wandering should be assessed by caregiver and if detected at any point, supervision and safe proof recommendations should be instituted.  MEDICATION SUPERVISION: Inability to self-administer medication needs to be constantly addressed. Implement a mechanism to ensure safe administration of the medications.   DRIVING: Regarding driving, in patients with progressive memory problems, driving will be impaired. We advise to have someone else do the driving if trouble finding directions or if minor accidents are reported. Independent driving assessment is available to determine safety of driving.   If you are interested in the driving assessment, you can contact the following:  [employer] in [place] [phone]  [facility] [phone]  [facility] [phone] or [phone]    Mediterranean Diet A Mediterranean diet refers to food and lifestyle choices that are based on the traditions of countries located on the [employer]. This way of eating has been shown to help prevent certain conditions and improve outcomes for people who have chronic diseases, like kidney disease and heart disease. What are tips for following this plan? Lifestyle  Cook and eat meals together with your family, when possible. Drink enough fluid to keep your urine clear or pale yellow. Be physically active every day. This includes: Aerobic exercise like running or swimming. Leisure activities like gardening, walking, or  housework. Get 7-8 hours of sleep each night. If recommended by your health care provider, drink red wine in moderation. This means 1 glass a day for nonpregnant women and 2 glasses a day for men. A glass of wine equals 5 oz (150 mL). Reading food labels  Check the serving size of packaged foods. For foods such as rice and pasta, the serving size refers to the amount of cooked product, not dry. Check the total fat in packaged foods. Avoid foods that have saturated fat or trans fats. Check the ingredients list for added sugars, such as corn syrup. Shopping  At the grocery store, buy most of your food from the areas near the walls of the store. This includes: Fresh fruits and vegetables (produce). Grains, beans, nuts, and seeds. Some of these may be available in unpackaged forms or large amounts (in bulk). Fresh seafood. Poultry and eggs. Low-fat dairy products. Buy whole ingredients instead of prepackaged foods. Buy fresh fruits and vegetables in-season from local farmers markets. Buy frozen fruits and vegetables in resealable bags. If you do not have access to quality fresh seafood, buy precooked frozen shrimp or canned fish, such as tuna, salmon, or sardines. Buy small amounts of raw or cooked vegetables, salads, or olives from the deli or salad bar at your store. Stock your pantry so you always have certain foods on hand, such as olive oil, canned tuna, canned tomatoes, rice, pasta, and beans. Cooking  Cook foods with  extra-virgin olive oil instead of using butter or other vegetable oils. Have meat as a side dish, and have vegetables or grains as your main dish. This means having meat in small portions or adding small amounts of meat to foods like pasta or stew. Use beans or vegetables instead of meat in common dishes like chili or lasagna. Experiment with different cooking methods. Try roasting or broiling vegetables instead of steaming or sauteing them. Add frozen vegetables to soups,  stews, pasta, or rice. Add nuts or seeds for added healthy fat at each meal. You can add these to yogurt, salads, or vegetable dishes. Marinate fish or vegetables using olive oil, lemon juice, garlic, and fresh herbs. Meal planning  Plan to eat 1 vegetarian meal one day each week. Try to work up to 2 vegetarian meals, if possible. Eat seafood 2 or more times a week. Have healthy snacks readily available, such as: Vegetable sticks with hummus. Greek yogurt. Fruit and nut trail mix. Eat balanced meals throughout the week. This includes: Fruit: 2-3 servings a day Vegetables: 4-5 servings a day Low-fat dairy: 2 servings a day Fish, poultry, or lean meat: 1 serving a day Beans and legumes: 2 or more servings a week Nuts and seeds: 1-2 servings a day Whole grains: 6-8 servings a day Extra-virgin olive oil: 3-4 servings a day Limit red meat and sweets to only a few servings a month What are my food choices? Mediterranean diet Recommended Grains: Whole-grain pasta. Brown rice. Bulgar wheat. Polenta. Couscous. Whole-wheat bread. Orpah Cobb. Vegetables: Artichokes. Beets. Broccoli. Cabbage. Carrots. Eggplant. Green beans. Chard. Kale. Spinach. Onions. Leeks. Peas. Squash. Tomatoes. Peppers. Radishes. Fruits: Apples. Apricots. Avocado. Berries. Bananas. Cherries. Dates. Figs. Grapes. Lemons. Melon. Oranges. Peaches. Plums. Pomegranate. Meats and other protein foods: Beans. Almonds. Sunflower seeds. Pine nuts. Peanuts. Cod. Salmon. Scallops. Shrimp. Tuna. Tilapia. Clams. Oysters. Eggs. Dairy: Low-fat milk. Cheese. Greek yogurt. Beverages: Water. Red wine. Herbal tea. Fats and oils: Extra virgin olive oil. Avocado oil. Grape seed oil. Sweets and desserts: [place] yogurt with honey. Baked apples. Poached pears. Trail mix. Seasoning and other foods: Basil. Cilantro. Coriander. Cumin. Mint. Parsley. Sage. Rosemary. Tarragon. Garlic. Oregano. Thyme. Pepper. Balsalmic vinegar. Tahini. Hummus. Tomato  sauce. Olives. Mushrooms. Limit these Grains: Prepackaged pasta or rice dishes. Prepackaged cereal with added sugar. Vegetables: Deep fried potatoes (french fries). Fruits: Fruit canned in syrup. Meats and other protein foods: Beef. Pork. Lamb. Poultry with skin. Hot dogs. Tomasa Blase. Dairy: Ice cream. Sour cream. Whole milk. Beverages: Juice. Sugar-sweetened soft drinks. Beer. Liquor and spirits. Fats and oils: Butter. Canola oil. Vegetable oil. Beef fat (tallow). Lard. Sweets and desserts: Cookies. Cakes. Pies. Candy. Seasoning and other foods: Mayonnaise. Premade sauces and marinades. The items listed may not be a complete list. Talk with your dietitian about what dietary choices are right for you. Summary The Mediterranean diet includes both food and lifestyle choices. Eat a variety of fresh fruits and vegetables, beans, nuts, seeds, and whole grains. Limit the amount of red meat and sweets that you eat. Talk with your health care provider about whether it is safe for you to drink red wine in moderation. This means 1 glass a day for nonpregnant women and 2 glasses a day for men. A glass of wine equals 5 oz (150 mL). This information is not intended to replace advice given to you by your health care provider. Make sure you discuss any questions you have with your health care provider. Document Released: 03/27/2016 Document Revised: 04/29/2016 Document  Reviewed: 03/27/2016 Elsevier Interactive Patient Education  2017 [employer].  We have sent a referral to [facility] for your MRI and they will call you directly to schedule your appointment. They are located at [address] [facility]. If you need to contact them directly please call [phone].

## 2023-09-25 DIAGNOSIS — N393 Stress incontinence (female) (male): Secondary | ICD-10-CM | POA: Diagnosis not present

## 2023-09-25 DIAGNOSIS — C61 Malignant neoplasm of prostate: Secondary | ICD-10-CM | POA: Diagnosis not present

## 2023-09-28 ENCOUNTER — Ambulatory Visit (INDEPENDENT_AMBULATORY_CARE_PROVIDER_SITE_OTHER): Payer: Medicare Other | Admitting: Behavioral Health

## 2023-09-28 DIAGNOSIS — G3184 Mild cognitive impairment, so stated: Secondary | ICD-10-CM | POA: Diagnosis not present

## 2023-09-28 DIAGNOSIS — F411 Generalized anxiety disorder: Secondary | ICD-10-CM

## 2023-09-28 NOTE — Progress Notes (Signed)
   Deneise Lever, LMFT

## 2023-09-28 NOTE — Progress Notes (Signed)
 [facility] Counselor/Therapist Progress Note  Patient ID: Russell Sampson, MRN: [id number],    Date: 10/20/2023  Time Spent: 55 min In Person @ [facility] Office  Time In: 2:00pm Time Out: 2:55pm  Treatment Type:  Cpl Th  Reported Symptoms: Inc in anxiety over Family issues & Last Will & Testament for Cpl to revise & clarify wishes.  Mental Status Exam: Appearance:  Casual     Behavior: Appropriate and Sharing  Motor: Normal  Speech/Language:  Clear and Coherent  Affect: Congruent  Mood: anxious  Thought process: Rumination @ ea session due to MCI progression  Thought content:   Rumination  Sensory/Perceptual disturbances:   WNL  Orientation: oriented to person, place, time/date, and situation  Attention: Good  Concentration: Good  Memory:  Dx of MCI  Fund of knowledge:  Good  Insight:   Fair  Judgment:  Good  Impulse Control: Fair   Risk Assessment: Danger to Self:  No Self-injurious Behavior: No Danger to Others: No Duty to Warn:no Physical Aggression / Violence:No  Access to Firearms a concern: No  Gang Involvement:No   Subjective: Pt present w/Wife today describing getting their affairs in order for once they die. The Atty has helped them revise their Last Will & Testament & other documents so it reflects their wishes for the Family.   Pt cont's to perseverate on certain topics of concern that involve the past, but today he was able to maintain interactions in the session that focus on the recent past & present. Wife related it has been difficult to work w/Pt's ST Memory issues lately. She expresses this in respectful ways & w/full concern for them both.    Interventions: Ego-Supportive and Psycho-education/Bibliotherapy  Diagnosis:Mild cognitive impairment of uncertain or unknown etiology  Generalized anxiety disorder  Plan: Anxiety reduction techniques to assist the Cpl to manage & function optimally w/MCI. Wife's anxiety & frustration seems  reduced somewhat today; she is focused on the Cpl's desire to have the affairs in order since they have been through Family issues in the past when things go unaddressed & unfinished by other Family members.   Target Date: 10/31/2023  Progress: 5  Frequency: Once every 2-3 wks  Modality: Cpl Th  Deneise Lever, LMFT

## 2023-10-01 ENCOUNTER — Ambulatory Visit: Payer: Medicare Other | Admitting: Internal Medicine

## 2023-10-14 ENCOUNTER — Encounter: Payer: Self-pay | Admitting: Internal Medicine

## 2023-10-14 ENCOUNTER — Ambulatory Visit (INDEPENDENT_AMBULATORY_CARE_PROVIDER_SITE_OTHER): Payer: Medicare Other | Admitting: Internal Medicine

## 2023-10-14 VITALS — BP 138/78 | HR 65 | Temp 98.5°F | Ht 70.0 in | Wt 185.0 lb

## 2023-10-14 DIAGNOSIS — R197 Diarrhea, unspecified: Secondary | ICD-10-CM | POA: Diagnosis not present

## 2023-10-14 DIAGNOSIS — M67449 Ganglion, unspecified hand: Secondary | ICD-10-CM | POA: Diagnosis not present

## 2023-10-14 DIAGNOSIS — E559 Vitamin D deficiency, unspecified: Secondary | ICD-10-CM | POA: Diagnosis not present

## 2023-10-14 DIAGNOSIS — R739 Hyperglycemia, unspecified: Secondary | ICD-10-CM | POA: Diagnosis not present

## 2023-10-14 DIAGNOSIS — E782 Mixed hyperlipidemia: Secondary | ICD-10-CM

## 2023-10-15 ENCOUNTER — Ambulatory Visit: Payer: Medicare Other | Admitting: Behavioral Health

## 2023-10-15 DIAGNOSIS — F411 Generalized anxiety disorder: Secondary | ICD-10-CM | POA: Diagnosis not present

## 2023-10-15 DIAGNOSIS — G3184 Mild cognitive impairment, so stated: Secondary | ICD-10-CM | POA: Diagnosis not present

## 2023-10-15 NOTE — Progress Notes (Signed)
   Deneise Lever, LMFT

## 2023-10-15 NOTE — Progress Notes (Signed)
 [facility] Counselor/Therapist Progress Note  Patient ID: Russell Sampson, MRN: [id number],    Date: 10/15/2023  Time Spent: 55 min In Person @ [facility] Office  Time In: 11:00am Time Out: 11:55am  Treatment Type:  Cpl Th  Reported Symptoms: Elevated anx/dep & stress due to cat bite in Wife's R wrist that resulted in a 5 day Hospitalization  Mental Status Exam: Appearance:  Casual     Behavior: Appropriate and Sharing  Motor: Normal  Speech/Language:  Clear and Coherent  Affect: Congruent  Mood: anxious  Thought process: normal  Thought content:   WNL  Sensory/Perceptual disturbances:   WNL  Orientation: oriented to person, place, time/date, and situation  Attention: Good  Concentration: Good  Memory: Dx of MCI  Fund of knowledge:  Good  Insight:   Fair  Judgment:  Fair  Impulse Control: Fair   Risk Assessment: Danger to Self:  No Self-injurious Behavior: No Danger to Others: No Duty to Warn:no Physical Aggression / Violence:No  Access to Firearms a concern: No  Gang Involvement:No   Subjective: Pt & Wife discussed the 5 day hospitalization of her for Tx due to house cat bite to her wrist. Wife rec'd Vanc & Rocephin IV & continues medications in the home situation.  Pt has handled the situation satisfactorially except navigaing the [facility] setting has been hard. Now they are home & managing well.   Interventions: Family Systems  Diagnosis:Mild cognitive impairment of uncertain or unknown etiology  Generalized anxiety disorder  Plan: Cpl will cont to accept the fact that MCI presents many difficulties, even to the Spouse who has no MCI. They accept they are @ different levels of understanding & they will do the best they can to inform the other.   Target Date: 10/31/2023  Progress: 5  Frequency: Once every 2-3 wks  Modality: Cpl Th  Deneise Lever, LMFT

## 2023-10-17 ENCOUNTER — Encounter: Payer: Self-pay | Admitting: Internal Medicine

## 2023-10-17 DIAGNOSIS — M67449 Ganglion, unspecified hand: Secondary | ICD-10-CM | POA: Insufficient documentation

## 2023-10-17 DIAGNOSIS — R197 Diarrhea, unspecified: Secondary | ICD-10-CM | POA: Insufficient documentation

## 2023-10-17 NOTE — Patient Instructions (Signed)
 Please continue all other medications as before, and refills have been done if requested.  Please have the pharmacy call with any other refills you may need.  Please continue your efforts at being more active, low cholesterol diet, and weight control.  Please keep your appointments with your specialists as you may have planned  Please call for hand surgury referral for any worsening

## 2023-10-17 NOTE — Assessment & Plan Note (Signed)
Last vitamin D Lab Results  Component Value Date   VD25OH 49.46 06/08/2023   Stable, cont oral replacement

## 2023-10-17 NOTE — Assessment & Plan Note (Signed)
 Likely IBS related, for immodium prn, probiotic every day, f/u GI apr 2 as planned

## 2023-10-17 NOTE — Assessment & Plan Note (Signed)
 Lab Results  Component Value Date   LDLCALC 68 06/08/2023   Stable, pt to continue current statin crestor 40 qd

## 2023-10-17 NOTE — Assessment & Plan Note (Signed)
 Clinical diagnosis, benign appearing, overall function not impaired, pt declines hand sugury referral for now

## 2023-10-17 NOTE — Assessment & Plan Note (Signed)
Lab Results  Component Value Date   HGBA1C 6.0 06/08/2023   Stable, pt to continue current medical treatment  - diet, wt control

## 2023-10-17 NOTE — Progress Notes (Signed)
 Patient ID: Russell Sampson, male   DOB: 05-Feb-1951, 73 y.o.   MRN: [id number]         Chief Complaint:: yearly exam and Cyst (On right pinky finger has been there for about 2 weeks was a little bigger but has gone down some and was more red before )  , IBS,        HPI:  Russell Sampson is a 73 y.o. male here overall doing ok but with new onset 5 days onset left 5th PIP joint rather dramatic swelling and tender sore, but now much improved size and tender in the past day; no obvious trauma.  Pt denies chest pain, increased sob or doe, wheezing, orthopnea, PND, increased LE swelling, palpitations, dizziness or syncope.   Pt denies polydipsia, polyuria, or new focal neuro s/s.    Pt denies fever, wt loss, night sweats, loss of appetite, or other constitutional symptoms  Denies worsening reflux, abd pain, dysphagia, n/v, or blood but also flare it seems of crampy abd pains with watery loose stools.  .               Wt Readings from Last 3 Encounters:  10/14/23 185 lb (83.9 kg)  09/17/23 187 lb (84.8 kg)  09/03/23 175 lb (79.4 kg)   BP Readings from Last 3 Encounters:  10/14/23 138/78  09/17/23 139/75  09/03/23 (!) 153/80   Immunization History  Administered Date(s) Administered   Fluad Quad(high Dose 65+) 07/01/2021, 05/03/2023   Fluad Trivalent(High Dose 65+) 05/20/2023   Influenza Split 06/05/2012   Influenza, High Dose Seasonal PF 04/05/2019   Influenza,inj,Quad PF,6+ Mos 04/16/2015, 05/12/2017   Influenza-Unspecified 03/26/2016, 04/18/2018, 05/30/2022   Moderna Sars-Covid-2 Vaccination 10/13/2019, 11/11/2019   PFIZER(Purple Top)SARS-COV-2 Vaccination 08/01/2020   Pneumococcal Conjugate-13 07/04/2018   Pneumococcal Polysaccharide-23 06/18/2018, 04/24/2021   Tdap 08/03/2017   Zoster Recombinant(Shingrix) 07/10/2017, 08/24/2017   Zoster, Live 04/16/2015   Health Maintenance Due  Topic Date Due   COVID-19 Vaccine (4 - 2024-25 season) 04/19/2023      Past Medical History:  Diagnosis  Date   Arthritis of finger of left hand 07/08/2021   Benign essential tremor    Right-hand   Benign neoplasm of ascending colon    Benign neoplasm of cecum    Benign neoplasm of sigmoid colon    Benign neoplasm of transverse colon    Cataract    bilateral; removed   Cerumen impaction 08/15/2022   Chicken pox    Difficult airway for intubation 03/10/2019   Effusion of right knee joint 04/11/2020   Elbow pain 08/29/2013   Generalized anxiety disorder with panic attacks 01/30/2011   Greater trochanteric bursitis of right hip 03/21/2020   Heart murmur 12/14/2019   HLD (hyperlipidemia) 12/14/2019   Hyperglycemia 12/15/2020   Low back pain 11/17/2022   Mild cognitive impairment of uncertain or unknown etiology 02/24/2022   Obesity 08/26/2018   Personal history of colonic polyps 10/2007   3-4 small adenomas   Posterior neck pain 08/15/2022   Prostate cancer    Right knee pain 08/15/2022   Right lumbar radiculopathy 03/28/2020   Thumb tendonitis 07/21/2020   Vitamin D deficiency 02/13/2022   Past Surgical History:  Procedure Laterality Date   CLOSED REDUCTION FOREARM FRACTURE     1962   COLONOSCOPY     COLONOSCOPY W/ POLYPECTOMY  10/22/2007   3-4 adenomas, max 6mm   COLONOSCOPY WITH PROPOFOL N/A 05/30/2019   Procedure: COLONOSCOPY WITH PROPOFOL;  Surgeon: Iva Boop,  MD;  Location: [facility];  Service: Endoscopy;  Laterality: N/A;   COLONOSCOPY WITH PROPOFOL N/A 09/03/2023   Procedure: COLONOSCOPY WITH PROPOFOL;  Surgeon: Iva Boop, MD;  Location: [facility];  Service: Gastroenterology;  Laterality: N/A;   LYMPHADENECTOMY Bilateral 11/10/2016   Procedure: PELVIC LYMPHADENECTOMY;  Surgeon: Heloise Purpura, MD;  Location: [facility];  Service: Urology;  Laterality: Bilateral;   POLYPECTOMY     POLYPECTOMY  05/30/2019   Procedure: POLYPECTOMY;  Surgeon: Iva Boop, MD;  Location: [facility];  Service: Endoscopy;;   POLYPECTOMY  09/03/2023   Procedure: POLYPECTOMY;   Surgeon: Iva Boop, MD;  Location: [facility];  Service: Gastroenterology;;   PROSTATE BIOPSY     09/19/2016   ROBOT ASSISTED LAPAROSCOPIC RADICAL PROSTATECTOMY N/A 11/10/2016   Procedure: XI ROBOTIC ASSISTED LAPAROSCOPIC RADICAL PROSTATECTOMY LEVEL 2;  Surgeon: Heloise Purpura, MD;  Location: [facility];  Service: Urology;  Laterality: N/A;    reports that he has never smoked. He has never used smokeless tobacco. He reports that he does not drink alcohol and does not use drugs. family history includes Alzheimer's disease in his maternal aunt, mother, and another family member; Alzheimer's disease (age of onset: 57) in his brother; Anxiety disorder in his sister; Prostate cancer in his father; Stroke in his mother. No Known Allergies Current Outpatient Medications on File Prior to Visit  Medication Sig Dispense Refill   acetaminophen (TYLENOL) 500 MG tablet Take 1,000 mg by mouth every 6 (six) hours as needed.     colchicine 0.6 MG tablet Take 0.6 mg by mouth daily.     donepezil (ARICEPT) 10 MG tablet Take 1 tablet (10 mg total) by mouth daily. 30 tablet 4   donepezil (ARICEPT) 5 MG tablet Take 5 mg by mouth daily.     memantine (NAMENDA) 10 MG tablet TAKE 1 TABLET BY MOUTH TWICE DAILY 60 tablet 11   Menthol, Topical Analgesic, (BENGAY EX) Apply 1 application topically daily as needed (muscle pain).     naproxen (NAPROSYN) 500 MG tablet Take 1 tablet (500 mg total) by mouth 2 (two) times daily as needed. 180 tablet 0   rosuvastatin (CRESTOR) 40 MG tablet Take 1 tablet (40 mg total) by mouth daily. 30 tablet 11   sertraline (ZOLOFT) 100 MG tablet Take 2 tablets (200 mg total) by mouth daily. 180 tablet 3   No current facility-administered medications on file prior to visit.        ROS:  All others reviewed and negative.  Objective        PE:  BP 138/78 (BP Location: Left Arm, Patient Position: Sitting, Cuff Size: Normal)   Pulse 65   Temp 98.5 F (36.9 C) (Oral)   Ht 5\' 10"  (1.778 m)    Wt 185 lb (83.9 kg)   SpO2 98%   BMI 26.54 kg/m                 Constitutional: Pt appears in NAD               HENT: Head: NCAT.                Right Ear: External ear normal.                 Left Ear: External ear normal.                Eyes: . Pupils are equal, round, and reactive to light. Conjunctivae and EOM are normal  Nose: without d/c or deformity               Neck: Neck supple. Gross normal ROM               Cardiovascular: Normal rate and regular rhythm.                 Pulmonary/Chest: Effort normal and breath sounds without rales or wheezing.                Abd:  Soft, NT, ND, + BS, no organomegaly               Neurological: Pt is alert. At baseline orientation, motor grossly intact               Skin: Skin is warm. No rashes, no other new lesions, LE edema - none; left 5th finger cystic mass to medial aspect PIP with minor tender               Psychiatric: Pt behavior is normal without agitation   Micro: none  Cardiac tracings I have personally interpreted today:  none  Pertinent Radiological findings (summarize): none   Lab Results  Component Value Date   WBC 6.6 06/08/2023   HGB 14.1 06/08/2023   HCT 43.8 06/08/2023   PLT 246.0 06/08/2023   GLUCOSE 96 06/08/2023   CHOL 138 06/08/2023   TRIG 102.0 06/08/2023   HDL 49.20 06/08/2023   LDLCALC 68 06/08/2023   ALT 25 06/08/2023   AST 21 06/08/2023   NA 140 06/08/2023   K 4.4 06/08/2023   CL 104 06/08/2023   CREATININE 0.79 06/08/2023   BUN 14 06/08/2023   CO2 28 06/08/2023   TSH 3.41 06/08/2023   PSA 0.06 (L) 06/08/2023   HGBA1C 6.0 06/08/2023   Assessment/Plan:  Russell Sampson is a 73 y.o. White or Caucasian [1] male with  has a past medical history of Arthritis of finger of left hand (07/08/2021), Benign essential tremor, Benign neoplasm of ascending colon, Benign neoplasm of cecum, Benign neoplasm of sigmoid colon, Benign neoplasm of transverse colon, Cataract, Cerumen impaction  (08/15/2022), Chicken pox, Difficult airway for intubation (03/10/2019), Effusion of right knee joint (04/11/2020), Elbow pain (08/29/2013), Generalized anxiety disorder with panic attacks (01/30/2011), Greater trochanteric bursitis of right hip (03/21/2020), Heart murmur (12/14/2019), HLD (hyperlipidemia) (12/14/2019), Hyperglycemia (12/15/2020), Low back pain (11/17/2022), Mild cognitive impairment of uncertain or unknown etiology (02/24/2022), Obesity (08/26/2018), Personal history of colonic polyps (10/2007), Posterior neck pain (08/15/2022), Prostate cancer, Right knee pain (08/15/2022), Right lumbar radiculopathy (03/28/2020), Thumb tendonitis (07/21/2020), and Vitamin D deficiency (02/13/2022).  Ganglion cyst of joint of finger Clinical diagnosis, benign appearing, overall function not impaired, pt declines hand sugury referral for now  Diarrhea Likely IBS related, for immodium prn, probiotic every day, f/u GI apr 2 as planned  Hyperglycemia Lab Results  Component Value Date   HGBA1C 6.0 06/08/2023   Stable, pt to continue current medical treatment  - diet, wt control   Vitamin D deficiency Last vitamin D Lab Results  Component Value Date   VD25OH 49.46 06/08/2023   Stable, cont oral replacement   HLD (hyperlipidemia) Lab Results  Component Value Date   LDLCALC 68 06/08/2023   Stable, pt to continue current statin crestor 40 qd  Followup: Return if symptoms worsen or fail to improve.  Oliver Barre, MD 10/17/2023 6:40 PM [facility]

## 2023-11-01 ENCOUNTER — Other Ambulatory Visit: Payer: Self-pay | Admitting: Physician Assistant

## 2023-11-10 DIAGNOSIS — D225 Melanocytic nevi of trunk: Secondary | ICD-10-CM | POA: Diagnosis not present

## 2023-11-10 DIAGNOSIS — L820 Inflamed seborrheic keratosis: Secondary | ICD-10-CM | POA: Diagnosis not present

## 2023-11-10 DIAGNOSIS — L821 Other seborrheic keratosis: Secondary | ICD-10-CM | POA: Diagnosis not present

## 2023-11-10 DIAGNOSIS — L814 Other melanin hyperpigmentation: Secondary | ICD-10-CM | POA: Diagnosis not present

## 2023-11-10 DIAGNOSIS — L538 Other specified erythematous conditions: Secondary | ICD-10-CM | POA: Diagnosis not present

## 2023-11-10 DIAGNOSIS — L728 Other follicular cysts of the skin and subcutaneous tissue: Secondary | ICD-10-CM | POA: Diagnosis not present

## 2023-11-11 ENCOUNTER — Ambulatory Visit: Payer: Medicare Other | Admitting: Behavioral Health

## 2023-11-15 ENCOUNTER — Other Ambulatory Visit: Payer: Self-pay | Admitting: Internal Medicine

## 2023-11-16 ENCOUNTER — Other Ambulatory Visit: Payer: Self-pay

## 2023-11-18 ENCOUNTER — Ambulatory Visit: Payer: Medicare Other | Admitting: Internal Medicine

## 2023-11-23 DIAGNOSIS — M1991 Primary osteoarthritis, unspecified site: Secondary | ICD-10-CM | POA: Diagnosis not present

## 2023-11-23 DIAGNOSIS — Z6826 Body mass index (BMI) 26.0-26.9, adult: Secondary | ICD-10-CM | POA: Diagnosis not present

## 2023-11-23 DIAGNOSIS — Z79899 Other long term (current) drug therapy: Secondary | ICD-10-CM | POA: Diagnosis not present

## 2023-11-23 DIAGNOSIS — M1120 Other chondrocalcinosis, unspecified site: Secondary | ICD-10-CM | POA: Diagnosis not present

## 2023-11-23 DIAGNOSIS — E663 Overweight: Secondary | ICD-10-CM | POA: Diagnosis not present

## 2023-12-01 ENCOUNTER — Ambulatory Visit: Admitting: Behavioral Health

## 2023-12-07 ENCOUNTER — Ambulatory Visit: Payer: Medicare Other | Admitting: Internal Medicine

## 2023-12-09 DIAGNOSIS — D487 Neoplasm of uncertain behavior of other specified sites: Secondary | ICD-10-CM | POA: Diagnosis not present

## 2023-12-22 ENCOUNTER — Ambulatory Visit: Admitting: Behavioral Health

## 2023-12-22 ENCOUNTER — Other Ambulatory Visit: Payer: Self-pay | Admitting: Physician Assistant

## 2024-02-02 ENCOUNTER — Encounter: Payer: Self-pay | Admitting: Psychology

## 2024-03-14 ENCOUNTER — Encounter: Payer: Self-pay | Admitting: Psychology

## 2024-03-14 ENCOUNTER — Ambulatory Visit (INDEPENDENT_AMBULATORY_CARE_PROVIDER_SITE_OTHER): Payer: No Typology Code available for payment source | Admitting: Psychology

## 2024-03-14 ENCOUNTER — Ambulatory Visit: Payer: Self-pay

## 2024-03-14 DIAGNOSIS — G309 Alzheimer's disease, unspecified: Secondary | ICD-10-CM

## 2024-03-14 DIAGNOSIS — F02A4 Dementia in other diseases classified elsewhere, mild, with anxiety: Secondary | ICD-10-CM | POA: Diagnosis not present

## 2024-03-14 DIAGNOSIS — F03A0 Unspecified dementia, mild, without behavioral disturbance, psychotic disturbance, mood disturbance, and anxiety: Secondary | ICD-10-CM

## 2024-03-14 HISTORY — DX: Unspecified dementia, mild, without behavioral disturbance, psychotic disturbance, mood disturbance, and anxiety: F03.A0

## 2024-03-14 NOTE — Progress Notes (Signed)
 NEUROPSYCHOLOGICAL EVALUATION [place]. [facility] [place] Department of Neurology  Date of Evaluation: March 14, 2024  Reason for Referral:   Russell Sampson is a 73 y.o. right-handed Caucasian male referred by Camie Sevin, PA-C, to characterize his current cognitive functioning and assist with diagnostic clarity and treatment planning in the context of a previously diagnosed mild neurocognitive disorder, concern for underlying Alzheimer's disease, and concern for progressive cognitive decline.   Assessment and Plan:   Clinical Impression(s): Russell Sampson pattern of performance is suggestive of severe impairment surrounding cognitive flexibility and all aspects of learning and memory. Further performance variability was exhibited across processing speed and attention/concentration. Performances were appropriate relative to age-matched peers across abstract reasoning, receptive and expressive language, and visuospatial abilities. Functionally, Russell Sampson wife noted that she has been providing some assistance with medication management and will need to provide reminders to ensure proper adherence. There are also concerns that Russell Sampson may order things and not remember, or have greater trouble with navigation while driving. Given cognitive impairment and the likelihood that this is directly interfering with day-to-day functioning, Russell Sampson best meets diagnostic criteria for a Major Neurocognitive Disorder (dementia). However, he would trend towards the mild end of this spectrum presently.   Relative to his initial evaluation in July 2023, decline specific to memory is primarily exhibited across diminished recognition performances, suggesting a worsening storage impairment. A diminished retention rate can also be seen across a shape learning task. Full amnesia across list and daily living memory tasks has been stable throughout all time points. Outside of memory, mild decline is  exhibited across basic attention, cognitive flexibility, and semantic fluency. Other assessed domains have exhibited relative stability over time.   Regarding the cause for his mild dementia presentation, concerns surrounding underlying Alzheimer's disease remain. Russell Sampson has a strong family history of this illness, including an early-onset presentation in his younger brother. Across memory testing, outside of some ability to retain story-based information, other retention rates ranged from 0% to 40%. He also consistently performed very poorly across recognition trials. Taken together, this suggests evidence for rapid forgetting and a pronounced storage impairment, both of which are the hallmark testing characteristics of this illness. Evidence for memory decline over the past several years (as well as semantic fluency decline) aligns with expectations of this illness. Current testing would suggest a slow rate of change over time.   Anxiety was present throughout testing. While the presence of significant anxiety can negatively impact performances, I continue to feel that these serves to worsen cognitive impairment caused by a neurological illness rather than be the primary source of dysfunction. Continued medical monitoring will be important moving forward.   Recommendations: If Russell Sampson and his wife wish for increased diagnostic clarity, they should discuss a lumbar puncture or blood plasma testing with Ms. Wertman.   Russell Sampson has already been prescribed a medication aimed to address memory loss and concerns surrounding Alzheimer's disease (i.e., memantine /Namenda ). He is encouraged to continue taking this medication as prescribed. It is important to highlight that this medication has been shown to slow functional decline in some individuals. There is no current treatment which can stop or reverse cognitive decline when caused by a neurodegenerative illness.   Performance across neurocognitive  testing is not a strong predictor of an individual's safety operating a motor vehicle. Should his family wish to pursue a formalized driving evaluation, they could reach out to the following agencies: [employer] in  [place]: [phone] [facility]: [phone] [facility]: [phone] [facility]: [phone] or [phone]  Should there be progression of current deficits over time, Russell Sampson is unlikely to regain any independent living skills lost. Therefore, it is recommended that he remain as involved as possible in all aspects of household chores, finances, and medication management, with supervision to ensure adequate performance. He will likely benefit from the establishment and maintenance of a routine in order to maximize his functional abilities over time.  It will be important for Russell Sampson to have another person with him when in situations where he may need to process information, weigh the pros and cons of different options, and make decisions, in order to ensure that he fully understands and recalls all information to be considered.  If not already done, Russell Sampson and his family may want to discuss his wishes regarding durable power of attorney and medical decision making, so that he can have input into these choices. If they require legal assistance with this, long-term care resource access, or other aspects of estate planning, they could reach out to [employer] at [phone] for a free consultation.   Russell Sampson is encouraged to attend to lifestyle factors for brain health (e.g., regular physical exercise, good nutrition habits and consideration of the MIND-DASH diet, regular participation in cognitively-stimulating activities, and general stress management techniques), which are likely to have benefits for both emotional adjustment and cognition. Optimal control of vascular risk factors (including safe cardiovascular exercise  and adherence to dietary recommendations) is encouraged. Continued participation in activities which provide mental stimulation and social interaction is also recommended.   Important information should be provided to Russell Sampson in written format in all instances. This information should be placed in a highly frequented and easily visible location within his home to promote recall. External strategies such as written notes in a consistently used memory journal, visual and nonverbal auditory cues such as a calendar on the refrigerator or appointments with alarm, such as on a cell phone, can also help maximize recall.  To address problems with processing speed, he may wish to consider:   -Ensuring that he is alerted when essential material or instructions are being presented   -Adjusting the speed at which new information is presented   -Allowing for more time in comprehending, processing, and responding in conversation   -Repeating and paraphrasing instructions or conversations aloud  To address problems with fluctuating attention and/or executive dysfunction, he may wish to consider:   -Avoiding external distractions when needing to concentrate   -Limiting exposure to fast paced environments with multiple sensory demands   -Writing down complicated information and using checklists   -Attempting and completing one task at a time (i.e., no multi-tasking)   -Verbalizing aloud each step of a task to maintain focus   -Taking frequent breaks during the completion of steps/tasks to avoid fatigue   -Reducing the amount of information considered at one time   -Scheduling more difficult activities for a time of day where he is usually most alert  Review of Records:   Russell Sampson completed a comprehensive neuropsychological evaluation with myself on 02/24/2022. Results suggested a primary impairment surrounding both encoding (i.e., learning) and retrieval aspects of verbal memory. A relative weakness was also  exhibited across phonemic fluency, while performance variability was exhibited across processing speed, executive functioning, and recognition/consolidation aspects of verbal memory. Performances were appropriate relative to age-matched peers across attention/concentration, safety/judgment, receptive language, semantic fluency, confrontation naming,  visuospatial abilities, and visual learning and memory. ADLs were described as intact and he was subsequently diagnosed with a mild neurocognitive disorder. The underlying cause was uncertain at the present time. Very early stages of Alzheimer's disease could not be ruled out and repeat testing was recommended.   He completed a second evaluation with myself on 03/05/2023. Results suggested severe impairment surrounding all aspects of verbal learning and memory. Additional impairments were exhibited across recognition/consolidation aspects of a visual memory task. Performance variability was exhibited across processing speed and executive functioning. Relative to his previous evaluation in July 2023, mild memory decline was exhibited. This was primarily exhibited across visual memory retention rates, as well as performance as a whole across yes/no recognition trials. Outside of memory, a mild decline was exhibited across a task assessing abstract reasoning, while mild improvements were exhibited across phonemic fluency. A subtle decline could be argued across semantic fluency; however, current performances remained normatively appropriate. Overall, non-memory domains exhibited a fairly strong sense of stability relative to previous testing. Concern for earlier stages of Alzheimer's disease was again expressed.   Past Medical History:  Diagnosis Date   Arthritis of finger of left hand 07/08/2021   Benign essential tremor    Right-hand   Benign neoplasm of ascending colon    Benign neoplasm of cecum    Benign neoplasm of sigmoid colon    Benign neoplasm of  transverse colon    Cataract    bilateral; removed   Cerumen impaction 08/15/2022   Chicken pox    Difficult airway for intubation 03/10/2019   Effusion of right knee joint 04/11/2020   Elbow pain 08/29/2013   Generalized anxiety disorder with panic attacks 01/30/2011   Greater trochanteric bursitis of right hip 03/21/2020   Heart murmur 12/14/2019   HLD (hyperlipidemia) 12/14/2019   Hyperglycemia 12/15/2020   Low back pain 11/17/2022   Mild cognitive impairment of uncertain or unknown etiology 02/24/2022   Obesity 08/26/2018   Personal history of colonic polyps 10/2007   3-4 small adenomas   Posterior neck pain 08/15/2022   Prostate cancer    Right knee pain 08/15/2022   Right lumbar radiculopathy 03/28/2020   Thumb tendonitis 07/21/2020   Vitamin D  deficiency 02/13/2022    Past Surgical History:  Procedure Laterality Date   CLOSED REDUCTION FOREARM FRACTURE     1962   COLONOSCOPY     COLONOSCOPY W/ POLYPECTOMY  10/22/2007   3-4 adenomas, max 6mm   COLONOSCOPY WITH PROPOFOL  N/A 05/30/2019   Procedure: COLONOSCOPY WITH PROPOFOL ;  Surgeon: Avram Lupita BRAVO, MD;  Location: [facility];  Service: Endoscopy;  Laterality: N/A;   COLONOSCOPY WITH PROPOFOL  N/A 09/03/2023   Procedure: COLONOSCOPY WITH PROPOFOL ;  Surgeon: Avram Lupita BRAVO, MD;  Location: [facility];  Service: Gastroenterology;  Laterality: N/A;   LYMPHADENECTOMY Bilateral 11/10/2016   Procedure: PELVIC LYMPHADENECTOMY;  Surgeon: Gretel Ferrara, MD;  Location: [facility];  Service: Urology;  Laterality: Bilateral;   POLYPECTOMY     POLYPECTOMY  05/30/2019   Procedure: POLYPECTOMY;  Surgeon: Avram Lupita BRAVO, MD;  Location: [facility];  Service: Endoscopy;;   POLYPECTOMY  09/03/2023   Procedure: POLYPECTOMY;  Surgeon: Avram Lupita BRAVO, MD;  Location: [facility];  Service: Gastroenterology;;   PROSTATE BIOPSY     09/19/2016   ROBOT ASSISTED LAPAROSCOPIC RADICAL PROSTATECTOMY N/A 11/10/2016   Procedure: XI ROBOTIC ASSISTED  LAPAROSCOPIC RADICAL PROSTATECTOMY LEVEL 2;  Surgeon: Gretel Ferrara, MD;  Location: [facility];  Service: Urology;  Laterality: N/A;  Current Outpatient Medications:    acetaminophen  (TYLENOL ) 500 MG tablet, Take 1,000 mg by mouth every 6 (six) hours as needed., Disp: , Rfl:    colchicine 0.6 MG tablet, Take 0.6 mg by mouth daily., Disp: , Rfl:    donepezil  (ARICEPT ) 10 MG tablet, TAKE 1 TABLET(10 MG) BY MOUTH DAILY, Disp: 30 tablet, Rfl: 4   donepezil  (ARICEPT ) 5 MG tablet, Take 5 mg by mouth daily., Disp: , Rfl:    memantine  (NAMENDA ) 10 MG tablet, TAKE 1 TABLET BY MOUTH TWICE DAILY, Disp: 60 tablet, Rfl: 11   Menthol, Topical Analgesic, (BENGAY EX), Apply 1 application topically daily as needed (muscle pain)., Disp: , Rfl:    naproxen  (NAPROSYN ) 500 MG tablet, Take 1 tablet (500 mg total) by mouth 2 (two) times daily as needed., Disp: 180 tablet, Rfl: 0   rosuvastatin  (CRESTOR ) 40 MG tablet, Take 1 tablet (40 mg total) by mouth daily., Disp: 30 tablet, Rfl: 11   sertraline  (ZOLOFT ) 100 MG tablet, TAKE 2 TABLETS(200 MG) BY MOUTH DAILY, Disp: 180 tablet, Rfl: 3     09/17/2023    9:00 AM 09/18/2022   12:00 PM  MMSE - Mini Mental State Exam  Orientation to time 5 5  Orientation to Place 5 5  Registration 3 3  Attention/ Calculation 5 5  Recall 2 1  Language- name 2 objects 2 2  Language- repeat 1 1  Language- follow 3 step command 3 3  Language- read & follow direction 1 1  Write a sentence 1 1  Copy design 1 1  Total score 29 28      02/07/2019    3:00 PM  Montreal Cognitive Assessment   Visuospatial/ Executive (0/5) 4  Naming (0/3) 3  Attention: Read list of digits (0/2) 2  Attention: Read list of letters (0/1) 1  Attention: Serial 7 subtraction starting at 100 (0/3) 3  Language: Repeat phrase (0/2) 1  Language : Fluency (0/1) 1  Abstraction (0/2) 0  Delayed Recall (0/5) 5  Orientation (0/6) 6  Total 26  Adjusted Score (based on education) 26   Neuroimaging: Brain MRI on  09/03/2018 revealed age-appropriate cerebral volume with extremely mild microvascular ischemic changes. Brain MRI on 08/05/2021 was said to be stable. No additional neuroimaging was available for review.   Clinical Interview:   The following information was obtained during a clinical interview with Russell Sampson and his wife prior to cognitive testing.  Cognitive Symptoms: Decreased short-term memory: Endorsed. Previously, he alluded to generalized forgetfulness and short-term memory concerns being present in his day-to-day life. No specific examples were provided. His wife previously described prominent repetition in day-to-day conversation and concern for rapid forgetting. She had provided a specific example where he helped her move some furniture in their home, only to ask why she didn't have him help her several hours later. This exact same back and forth conversation was said to happen a third time later the same day. Per his wife, memory decline has been observed since his previous July 2024 evaluation, said to be more noteworthy during the past 2-3 months.  Decreased long-term memory: Denied. Decreased attention/concentration: Denied. Reduced processing speed: Denied. His wife previously reported her perception of some slowed processing speed.  Difficulties with executive functions: Denied. His wife previously reported some trouble with organization and following directions. The latter may have more to do with quick forgetting and suspected memory impairment. They both denied trouble with impulsivity or any significant personality changes.  Difficulties with  emotion regulation: Denied. Difficulties with receptive language: Denied. Difficulties with word finding: Denied. Decreased visuoperceptual ability: Denied.  Difficulties completing ADLs: Somewhat. His wife has some involvement in medication management and noted that she will need to provide several reminder prompts at times as Russell Sampson may  get distracted and forget medications. She primarily manages finances and bill paying. She noted that Russell Sampson has ordered things online, only to forget that he had ordered them when they arrived. He continues to drive without reported safety concerns. His wife had previously described increasing navigational concerns.   Additional Medical History: History of traumatic brain injury/concussion: Denied. History of stroke: Denied. History of seizure activity: Denied. History of known exposure to toxins: Denied. Symptoms of chronic pain: Denied. Experience of frequent headaches/migraines: Denied. Frequent instances of dizziness/vertigo: Denied.   Sensory changes: Denied. He underwent bilateral cataract surgery in the past to improve his vision.  Balance/coordination difficulties: Denied. They also denied any recent falls.  Other motor difficulties: Endorsed. He does have a history of some very mild shakiness in his right hand, previously diagnosed as benign essential tremor. Symptoms have remained stable over time.   Sleep History: Estimated hours obtained each night: 6-8 hours.  Difficulties falling asleep: Denied. Difficulties staying asleep: Denied. Feels rested and refreshed upon awakening: Endorsed. His wife previously noted that he may experience daytime fatigue and nap during the day.    History of snoring: Endorsed. History of waking up gasping for air: Endorsed.  Witnessed breath cessation while asleep: Endorsed. His wife previously expressed concerns for sleep apnea, stating that Mr. Medeiros does seem to temporarily stop breathing while asleep and often experiences choking sensations. He has not had a formal sleep study performed.    History of vivid dreaming: Denied. Excessive movement while asleep: Denied. Instances of acting out his dreams: Denied.  Psychiatric/Behavioral Health History: Depression: He described his current mood as pretty good overall. He did report that his  younger brother who suffered from early-onset Alzheimer's disease had passed away 23-Aug-2022) which negatively impacted his mood given his personal concerns surrounding that illness being present. Current or remote suicidal ideation, intent, or plan was denied.  Anxiety: Endorsed. Fairly significant generalized anxiety is longstanding in nature. These symptoms have been elevated given his family history of suspected Alzheimer's disease, especially his younger brother's early-onset presentation. His wife previously noted that he would panic whenever he misplaced or lost something. However, this has been improved since his PCP prescribed Zoloft . Mania: Denied. Trauma History: Denied. Visual/auditory hallucinations: Denied. Delusional thoughts: Denied.   Tobacco: Denied. Alcohol: He denied current alcohol consumption as well as a history of problematic alcohol abuse or dependence.  Recreational drugs: Denied.  Family History: Problem Relation Age of Onset   Alzheimer's disease Mother    Stroke Mother    Prostate cancer Father    Anxiety disorder Sister    Alzheimer's disease Brother 2       early-onset   Alzheimer's disease Maternal Aunt    Alzheimer's disease Other        Maternal aunt's daughter   Colon cancer Neg Hx    Colon polyps Neg Hx    Esophageal cancer Neg Hx    Rectal cancer Neg Hx    Stomach cancer Neg Hx    This information was confirmed by Mr. Bas.  Academic/Vocational History: Highest level of educational attainment: 16 years. He graduated from high school and earned a Oncologist in music education from [employer]. He described himself  as an average (mostly B) student in academic settings. Math was noted as a likely relative weakness.   History of developmental delay: Denied. History of grade repetition: Denied. Enrollment in special education courses: Denied. History of LD/ADHD: Denied.   Employment: Retired. 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   Evaluation Results:   Behavioral Observations: Mr. Cleland was accompanied by his wife, arrived to his appointment on time, and was appropriately dressed and groomed. He appeared alert. Observed gait and station were within normal limits. Gross motor functioning appeared intact upon informal observation and no abnormal movements (e.g., tremors) were noted. His affect was generally relaxed and positive, but did range appropriately given the subject being discussed during interview. Spontaneous speech was fluent and word finding difficulties were not observed during the clinical interview. Thought processes were coherent, organized, and normal in content. He was fairly repetitive during interview, often repeating the same content shortly after it had been previously stated. He did not appear to appreciate this behavior. Insight into his cognitive difficulties appeared limited and I worry that he does not have a full appreciation for the true extent of ongoing memory impairment at the present time.   During testing, repetitive statements/remarks continued over the course of the testing session. He also appeared fairly anxious throughout testing. Sustained attention was appropriate. Task engagement was adequate and he persisted when challenged. Overall, Mr. Cuevas was cooperative with the clinical interview and subsequent testing procedures.   Adequacy of Effort: The validity of neuropsychological testing is limited by the extent to which the individual being tested may be assumed to have exerted adequate effort during testing. Mr. Andal expressed his intention to perform to the best of his abilities and exhibited adequate task engagement and persistence. Scores across stand-alone and embedded performance validity measures were within expectation. As such, the results of the  current evaluation are believed to be a valid representation of Mr. Ashraf current cognitive functioning.  Test Results: Mr. Caiazzo was oriented at the time of the current evaluation.  Intellectual abilities based upon educational and vocational attainment were estimated to be in the average range. Premorbid abilities were estimated to be within the below average range based upon a single-word reading test.   Processing speed was variable, ranging from the exceptionally low to above average normative ranges. Basic attention was well below average. More complex attention (e.g., working memory) was average. Executive functioning was variable, ranging from the well below average to average normative ranges.  While not directly assessed, receptive language abilities were believed to be intact. Mr. Damron did not exhibit any difficulties comprehending task instructions and answered all questions asked of him appropriately. Assessed expressive language (e.g., verbal fluency and confrontation naming) was below average to average.     Assessed visuospatial/visuoconstructional abilities were average to above average. Points were lost on his drawing of a clock due to him never drawing the outer circle. However, numbers were still arranged in a circular fashion appropriately.    Learning (i.e., encoding) of novel verbal and visual information was exceptionally low to below average. Spontaneous delayed recall (i.e., retrieval) of previously learned information was exceptionally low to well below average. Retention rates were 0% across a list learning task, 82% across a story learning task, 0% across a daily living task, and 40% across a shape learning task. Performance across recognition tasks was exceptionally low to well  below average, suggesting very limited evidence for information consolidation.   Results of emotional screening instruments suggested that recent symptoms of generalized anxiety were in the  minimal range, while symptoms of depression were within normal limits. A screening instrument assessing recent sleep quality suggested the presence of minimal sleep dysfunction.  Table of Scores:   Note: This summary of test scores accompanies the interpretive report and should not be considered in isolation without reference to the appropriate sections in the text. Descriptors are based on appropriate normative data and may be adjusted based on clinical judgment. Terms such as Within Normal Limits and Outside Normal Limits are used when a more specific description of the test score cannot be determined. Descriptors refer to the current evaluation only.          Percentile - Normative Descriptor > 98 - Exceptionally High 91-97 - Well Above Average 75-90 - Above Average 25-74 - Average 9-24 - Below Average 2-8 - Well Below Average < 2 - Exceptionally Low         Validity: July 2023 July 2024 Current  DESCRIPTOR         DCT: --- --- --- --- Within Normal Limits         Orientation:        Raw Score Raw Score Raw Score Percentile   NAB Orientation, Form 1 28/29 27/29 28/29  --- ---         Intellectual Functioning:        Standard Score Standard Score Standard Score Percentile   Test of Premorbid Functioning: 89 82 85 16 Below Average         Memory:       NAB Memory Module, Form 1: Standard Score/ T Score Standard Score/ T Score Standard Score/ T Score Percentile   Total Memory Index 64 67 60 <1 Exceptionally Low  List Learning         Total Trials 1-3 16/36 (35) 14/36 (33) 13/36 (31) 3 Well Below Average    List B 3/12 (40) 2/12 (34) 3/12 (41) 18 Below Average    Short Delay Free Recall 2/12 (23) 4/12 (34) 1/12 (21) <1 Exceptionally Low    Long Delay Free Recall 0/12 (21) 0/12 (23) 0/12 (23) <1 Exceptionally Low    Retention Percentage 0 (<19) 0 (22) 0 (22) <1 Exceptionally Low    Recognition Discriminability -1 (25) -2 (24) -5 (<19) <1 Exceptionally Low  Shape Learning          Total Trials 1-3 13/27 (42) 17/27 (55) 11/27 (38) 12 Below Average    Delayed Recall 5/9 (45) 4/9 (40) 2/9 (25) 1 Exceptionally Low    Retention Percentage 100 (51) 57 (38) 40 (33) 5 Well Below Average    Recognition Discriminability 4 (37) 1 (21) 3 (31) 3 Well Below Average  Story Learning         Immediate Recall 21/80 (21) 37/80 (31) 25/80 (23) <1 Exceptionally Low    Delayed Recall 8/40 (29) 15/40 (31) 14/40 (31) 3 Well Below Average    Retention Percentage 67 (39) 65 (39) 82 (46) 34 Average  Daily Living Memory         Immediate Recall 39/51 (42) 33/51 (35) 36/51 (39) 14 Below Average    Delayed Recall 0/17 (19) 0/17 (19) 0/17 (19) <1 Exceptionally Low    Retention Percentage 0 (<19) 0 (<19) 0 (<19) <1 Exceptionally Low    Recognition Hits 8/10 (44) 3/10 (<19) 1/10 (<19) <1 Exceptionally Low  Attention/Executive Function:       Trail Making Test (TMT): Raw Score (T Score) Raw Score (T Score) Raw Score (T Score) Percentile     Part A 66 secs.,  0 errors (28) 82 secs.,  0 errors (21) 72 secs.,  0 errors (25) 1 Exceptionally Low    Part B 140 secs.,  1 error (35) 127 secs.,  0 errors (37) 218 secs.,  1 error (28) 2 Well Below Average           Scaled Score Scaled Score Scaled Score Percentile   WAIS-IV Coding: 9 10 12  75 Above Average         NAB Attention Module, Form 1: T Score T Score T Score Percentile     Digits Forward 49 44 36 8 Well Below Average    Digits Backwards 45 45 50 50 Average          Scaled Score Scaled Score Scaled Score Percentile   WAIS-IV Similarities: 8 5 9  37 Average         D-KEFS Verbal Fluency Test: Raw Score (Scaled Score) Raw Score (Scaled Score) Raw Score (Scaled Score) Percentile     Letter Total Correct 21 (6) 35 (10) 31 (9) 37 Average    Category Total Correct 47 (15) 38 (12) 32 (10) 50 Average    Category Switching Total Correct 10 (7) 8 (5) 7 (4) 2 Well Below Average    Category Switching Accuracy 8 (7) 6 (5) 4 (3) 1  Exceptionally Low      Total Set Loss Errors 7 (4) 11 (1) 8 (3) 1 Exceptionally Low      Total Repetition Errors 11 (2) 6 (7) 14 (1) <1 Exceptionally Low         Language:       Verbal Fluency Test: Raw Score (T Score) Raw Score (T Score) Raw Score (T Score) Percentile     Phonemic Fluency (FAS) 21 (30) 35 (45) 31 (41) 18 Below Average    Animal Fluency 24 (58) 17 (47) 14 (38) 12 Below Average          NAB Language Module, Form 1: T Score T Score T Score Percentile     Naming 29/31 (41) 29/31 (43) 29/31 (43) 25 Average         Visuospatial/Visuoconstruction:        Raw Score Raw Score Raw Score Percentile   Clock Drawing: 9/10 10/10 8/10 --- Within Normal Limits         NAB Spatial Module, Form 1: T Score T Score T Score Percentile     Figure Drawing Copy 56 57 60 84 Above Average          Scaled Score Scaled Score Scaled Score Percentile   WAIS-IV Block Design: 8 8 8 25  Average         Mood and Personality:        Raw Score Raw Score Raw Score Percentile   Beck Depression Inventory - II: 4 1 2  --- Within Normal Limits  PROMIS Anxiety Questionnaire: 17 16 12  --- None to Slight         Additional Questionnaires:        Raw Score Raw Score Raw Score Percentile   PROMIS Sleep Disturbance Questionnaire: 21 8 10  --- None to Slight   Informed Consent and Coding/Compliance:   The current evaluation represents a clinical evaluation for the purposes previously outlined by the referral source and is in no way  reflective of a forensic evaluation.   Mr. Stiefel was provided with a verbal description of the nature and purpose of the present neuropsychological evaluation. Also reviewed were the foreseeable risks and/or discomforts and benefits of the procedure, limits of confidentiality, and mandatory reporting requirements of this provider. The patient was given the opportunity to ask questions and receive answers about the evaluation. Oral consent to participate was provided by the patient.    This evaluation was conducted by Arthea KYM Maryland, Ph.D., ABPP-CN, board certified clinical neuropsychologist. Mr. Platner completed a clinical interview with Dr. Maryland, billed as one unit [phone], and 140 minutes of cognitive testing and scoring, billed as one unit [phone] and four additional units [id number]. As a separate and discrete service, one unit [phone] and two units 96133 (160 minutes) were billed for Dr. Loralee time spent in interpretation and report writing.

## 2024-03-21 ENCOUNTER — Ambulatory Visit (INDEPENDENT_AMBULATORY_CARE_PROVIDER_SITE_OTHER): Payer: No Typology Code available for payment source | Admitting: Psychology

## 2024-03-21 DIAGNOSIS — G309 Alzheimer's disease, unspecified: Secondary | ICD-10-CM

## 2024-03-21 DIAGNOSIS — F02A4 Dementia in other diseases classified elsewhere, mild, with anxiety: Secondary | ICD-10-CM

## 2024-03-21 NOTE — Progress Notes (Signed)
   Neuropsychology Feedback Session Russell Sampson [facility] [place] Department of Neurology  Reason for Referral:   Russell Sampson is a 73 y.o. right-handed Caucasian male referred by Camie Sevin, PA-C, to characterize his current cognitive functioning and assist with diagnostic clarity and treatment planning in the context of a previously diagnosed mild neurocognitive disorder, concern for underlying Alzheimer's disease, and concern for progressive cognitive decline.   Feedback:   Russell Sampson completed a comprehensive neuropsychological evaluation on 03/14/2024. Please refer to that encounter for the full report and recommendations. Briefly, results suggested severe impairment surrounding cognitive flexibility and all aspects of learning and memory. Further performance variability was exhibited across processing speed and attention/concentration. Relative to his initial evaluation in July 2023, decline specific to memory is primarily exhibited across diminished recognition performances, suggesting a worsening storage impairment. A diminished retention rate can also be seen across a shape learning task. Full amnesia across list and daily living memory tasks has been stable throughout all time points. Outside of memory, mild decline is exhibited across basic attention, cognitive flexibility, and semantic fluency. Other assessed domains have exhibited relative stability over time. Regarding the cause for his mild dementia presentation, concerns surrounding underlying Alzheimer's disease remain. Russell Sampson has a strong family history of this illness, including an early-onset presentation in his younger brother. Across memory testing, outside of some ability to retain story-based information, other retention rates ranged from 0% to 40%. He also consistently performed very poorly across recognition trials. Taken together, this suggests evidence for rapid forgetting and a pronounced storage impairment,  both of which are the hallmark testing characteristics of this illness. Evidence for memory decline over the past several years (as well as semantic fluency decline) aligns with expectations of this illness. Current testing would suggest a slow rate of change over time.   Russell Sampson was accompanied by his wife during the current feedback session. Content of the current session focused on the results of his neuropsychological evaluation. Russell Sampson was given the opportunity to ask questions and his questions were answered. He was encouraged to reach out should additional questions arise. A copy of his report was provided at the conclusion of the visit.      One unit 96132 (45 minutes) was billed for Dr. Loralee time spent preparing for, conducting, and documenting the current feedback session with Russell Sampson.

## 2024-03-24 ENCOUNTER — Ambulatory Visit: Payer: Medicare Other | Admitting: Physician Assistant

## 2024-04-05 ENCOUNTER — Ambulatory Visit (INDEPENDENT_AMBULATORY_CARE_PROVIDER_SITE_OTHER): Admitting: Behavioral Health

## 2024-04-05 DIAGNOSIS — F411 Generalized anxiety disorder: Secondary | ICD-10-CM | POA: Diagnosis not present

## 2024-04-05 DIAGNOSIS — G3184 Mild cognitive impairment, so stated: Secondary | ICD-10-CM

## 2024-04-05 NOTE — Progress Notes (Signed)
 [facility] Counselor/Therapist Progress Note  Patient ID: SAKARI ALKHATIB, MRN: [id number],    Date: 04/05/2024  Time Spent: 50 min Caregility video; Pt & Wife are together today trying to accept the new, updated Dx from the Neurologist & the recent testing update.  Treatment Type: Cpl Th   Reported Symptoms: Elevated anx/dep & stress due to recent testing that has updated his testing for MCI/Dementia/Alzheimer's.  Mental Status Exam: Appearance:  Casual     Behavior: Appropriate and Sharing  Motor: Normal  Speech/Language:  Clear and Coherent  Affect: Appropriate  Mood: normal  Thought process: normal  Thought content:   WNL  Sensory/Perceptual disturbances:   WNL  Orientation: oriented to person, place, time/date, and situation  Attention: Good; revised Dx of severe MCI, even Dementia/Alzheimer's   Concentration: Good  Memory: WNL  Fund of knowledge:  Good  Insight:   Good  Judgment:  Good  Impulse Control: Good   Risk Assessment: Danger to Self:  No Self-injurious Behavior: No Danger to Others: No Duty to Warn:no Physical Aggression / Violence:No  Access to Firearms a concern: No  Gang Involvement:No   Subjective: Pt has been given a revised Dx of more severe Cognitive Impairment. He is trying to accept this current situation w/o it impacting him severely. Wife wants him to discontinue driving.   Interventions: Cpl Th using Gottman & EFT  Diagnosis:Mild cognitive impairment of uncertain or unknown etiology  Generalized anxiety disorder  Plan: [name] & Marval are trying to engage in a discussion today to quit his driving ability prior to his loss of ability to drive in general. We discussed how Madeleine can decide for himself about his driving ability.  Target Date: 05/02/2024  Progress: 5  Frequency: Once every 2-3 wks  Modality: Cpl Th  [name], LMFT

## 2024-04-15 DIAGNOSIS — H35371 Puckering of macula, right eye: Secondary | ICD-10-CM | POA: Diagnosis not present

## 2024-04-15 DIAGNOSIS — D487 Neoplasm of uncertain behavior of other specified sites: Secondary | ICD-10-CM | POA: Diagnosis not present

## 2024-04-15 DIAGNOSIS — H35431 Paving stone degeneration of retina, right eye: Secondary | ICD-10-CM | POA: Diagnosis not present

## 2024-04-15 DIAGNOSIS — H26492 Other secondary cataract, left eye: Secondary | ICD-10-CM | POA: Diagnosis not present

## 2024-04-26 ENCOUNTER — Ambulatory Visit (INDEPENDENT_AMBULATORY_CARE_PROVIDER_SITE_OTHER): Admitting: Behavioral Health

## 2024-04-26 DIAGNOSIS — G3184 Mild cognitive impairment, so stated: Secondary | ICD-10-CM

## 2024-04-26 DIAGNOSIS — F411 Generalized anxiety disorder: Secondary | ICD-10-CM | POA: Diagnosis not present

## 2024-04-26 NOTE — Progress Notes (Signed)
 [facility] Counselor/Therapist Progress Note  Patient ID: Russell Sampson, MRN: [id number],    Date: 04/26/2024  Time Spent: 50 min Caregility video; Cpl is aware of the risks/limitations of  telehealth & consents to Tx today. Cpl is in the home in private & Provider working remotely from [employer]. Time In: 2:00pm Time Out: 2:50pm  Treatment Type: Individual Therapy  Reported Symptoms: Elevated anx/dep & stress over the World situation & his own health status changes  Mental Status Exam: Appearance:  Casual     Behavior: Appropriate and Sharing  Motor: Normal  Speech/Language:  Clear and Coherent  Affect: Appropriate  Mood: normal  Thought process: normal  Thought content:   WNL  Sensory/Perceptual disturbances:   WNL  Orientation: oriented to person, place, time/date, and situation  Attention: Good  Concentration: Good  Memory: WNL  Fund of knowledge:  Good  Insight:   Good  Judgment:  Good  Impulse Control: Good   Risk Assessment: Danger to Self:  No Self-injurious Behavior: No Danger to Others: No Duty to Warn:no Physical Aggression / Violence:No  Access to Firearms a concern: No  Gang Involvement:No   Subjective: Pt  is highly aware of the current state of the world. He will reduce his exposure to the [employer].    Interventions: Family Systems  Diagnosis:Mild cognitive impairment of uncertain or unknown etiology  Generalized anxiety disorder  Plan: Charlena will use his Wife Marval to reduce his exposure to the [employer].   Target Date: 05/17/2024  Progress:5  Frequency Once every 2-3 wks:   Modality: Cpl Th  Russell LITTIE Ling, LMFT

## 2024-04-27 ENCOUNTER — Other Ambulatory Visit: Payer: Self-pay | Admitting: Adult Health

## 2024-05-03 DIAGNOSIS — M79671 Pain in right foot: Secondary | ICD-10-CM | POA: Diagnosis not present

## 2024-05-09 ENCOUNTER — Encounter: Payer: Self-pay | Admitting: Physician Assistant

## 2024-05-09 ENCOUNTER — Ambulatory Visit: Payer: Self-pay | Admitting: Internal Medicine

## 2024-05-09 ENCOUNTER — Ambulatory Visit (INDEPENDENT_AMBULATORY_CARE_PROVIDER_SITE_OTHER): Admitting: Physician Assistant

## 2024-05-09 ENCOUNTER — Ambulatory Visit: Payer: Medicare Other | Admitting: Internal Medicine

## 2024-05-09 ENCOUNTER — Encounter: Payer: Self-pay | Admitting: Internal Medicine

## 2024-05-09 VITALS — BP 154/92 | HR 67 | Temp 97.9°F | Ht 70.0 in | Wt 185.6 lb

## 2024-05-09 VITALS — BP 156/77 | HR 76 | Ht 70.5 in | Wt 185.0 lb

## 2024-05-09 DIAGNOSIS — F411 Generalized anxiety disorder: Secondary | ICD-10-CM | POA: Diagnosis not present

## 2024-05-09 DIAGNOSIS — C61 Malignant neoplasm of prostate: Secondary | ICD-10-CM | POA: Diagnosis not present

## 2024-05-09 DIAGNOSIS — F02A4 Dementia in other diseases classified elsewhere, mild, with anxiety: Secondary | ICD-10-CM

## 2024-05-09 DIAGNOSIS — R739 Hyperglycemia, unspecified: Secondary | ICD-10-CM | POA: Diagnosis not present

## 2024-05-09 DIAGNOSIS — G309 Alzheimer's disease, unspecified: Secondary | ICD-10-CM

## 2024-05-09 DIAGNOSIS — E559 Vitamin D deficiency, unspecified: Secondary | ICD-10-CM | POA: Diagnosis not present

## 2024-05-09 DIAGNOSIS — F410 Panic disorder [episodic paroxysmal anxiety] without agoraphobia: Secondary | ICD-10-CM | POA: Diagnosis not present

## 2024-05-09 DIAGNOSIS — E782 Mixed hyperlipidemia: Secondary | ICD-10-CM | POA: Diagnosis not present

## 2024-05-09 DIAGNOSIS — R4189 Other symptoms and signs involving cognitive functions and awareness: Secondary | ICD-10-CM | POA: Insufficient documentation

## 2024-05-09 DIAGNOSIS — E538 Deficiency of other specified B group vitamins: Secondary | ICD-10-CM | POA: Diagnosis not present

## 2024-05-09 DIAGNOSIS — M11261 Other chondrocalcinosis, right knee: Secondary | ICD-10-CM

## 2024-05-09 DIAGNOSIS — Z23 Encounter for immunization: Secondary | ICD-10-CM | POA: Diagnosis not present

## 2024-05-09 LAB — URINALYSIS, ROUTINE W REFLEX MICROSCOPIC
Hgb urine dipstick: NEGATIVE
Leukocytes,Ua: NEGATIVE
Nitrite: NEGATIVE
Specific Gravity, Urine: 1.025 (ref 1.000–1.030)
Urine Glucose: NEGATIVE
Urobilinogen, UA: 1 (ref 0.0–1.0)
pH: 6.5 (ref 5.0–8.0)

## 2024-05-09 LAB — CBC WITH DIFFERENTIAL/PLATELET
Basophils Absolute: 0.1 K/uL (ref 0.0–0.1)
Basophils Relative: 1 % (ref 0.0–3.0)
Eosinophils Absolute: 0.6 K/uL (ref 0.0–0.7)
Eosinophils Relative: 9.6 % — ABNORMAL HIGH (ref 0.0–5.0)
HCT: 39.6 % (ref 39.0–52.0)
Hemoglobin: 13.2 g/dL (ref 13.0–17.0)
Lymphocytes Relative: 24.2 % (ref 12.0–46.0)
Lymphs Abs: 1.4 K/uL (ref 0.7–4.0)
MCHC: 33.4 g/dL (ref 30.0–36.0)
MCV: 82.4 fl (ref 78.0–100.0)
Monocytes Absolute: 0.4 K/uL (ref 0.1–1.0)
Monocytes Relative: 6.9 % (ref 3.0–12.0)
Neutro Abs: 3.4 K/uL (ref 1.4–7.7)
Neutrophils Relative %: 58.3 % (ref 43.0–77.0)
Platelets: 209 K/uL (ref 150.0–400.0)
RBC: 4.81 Mil/uL (ref 4.22–5.81)
RDW: 14.1 % (ref 11.5–15.5)
WBC: 5.9 K/uL (ref 4.0–10.5)

## 2024-05-09 LAB — HEPATIC FUNCTION PANEL
ALT: 18 U/L (ref 0–53)
AST: 17 U/L (ref 0–37)
Albumin: 4.1 g/dL (ref 3.5–5.2)
Alkaline Phosphatase: 83 U/L (ref 39–117)
Bilirubin, Direct: 0.1 mg/dL (ref 0.0–0.3)
Total Bilirubin: 0.5 mg/dL (ref 0.2–1.2)
Total Protein: 6.1 g/dL (ref 6.0–8.3)

## 2024-05-09 LAB — LIPID PANEL
Cholesterol: 119 mg/dL (ref 0–200)
HDL: 51.9 mg/dL (ref >39.00)
LDL Cholesterol: 58 mg/dL (ref 0–99)
NonHDL: 66.83
Total CHOL/HDL Ratio: 2
Triglycerides: 46 mg/dL (ref 0.0–149.0)
VLDL: 9.2 mg/dL (ref 0.0–40.0)

## 2024-05-09 LAB — BASIC METABOLIC PANEL WITH GFR
BUN: 16 mg/dL (ref 6–23)
CO2: 28 meq/L (ref 19–32)
Calcium: 8.9 mg/dL (ref 8.4–10.5)
Chloride: 106 meq/L (ref 96–112)
Creatinine, Ser: 0.76 mg/dL (ref 0.40–1.50)
GFR: 90.63 mL/min (ref >60.00)
Glucose, Bld: 93 mg/dL (ref 70–99)
Potassium: 4.3 meq/L (ref 3.5–5.1)
Sodium: 141 meq/L (ref 135–145)

## 2024-05-09 LAB — TSH: TSH: 3.35 u[IU]/mL (ref 0.35–5.50)

## 2024-05-09 LAB — PSA: PSA: 0.1 ng/mL (ref 0.10–4.00)

## 2024-05-09 LAB — HEMOGLOBIN A1C: Hgb A1c MFr Bld: 6.2 % (ref 4.6–6.5)

## 2024-05-09 LAB — VITAMIN D 25 HYDROXY (VIT D DEFICIENCY, FRACTURES): VITD: 51.12 ng/mL (ref 30.00–100.00)

## 2024-05-09 LAB — VITAMIN B12: Vitamin B-12: 191 pg/mL — ABNORMAL LOW (ref 211–911)

## 2024-05-09 MED ORDER — BUPROPION HCL ER (XL) 150 MG PO TB24: 150.0000 mg | ORAL_TABLET | Freq: Every day | ORAL | 3 refills | Status: AC

## 2024-05-09 NOTE — Assessment & Plan Note (Signed)
 Lab Results  Component Value Date   LDLCALC 68 06/08/2023   Stable, pt to continue current statin 40 mg every day and f/u lab today

## 2024-05-09 NOTE — Assessment & Plan Note (Signed)
 Also for psa today with labs, has been stable > 5 yrs, now sees urology only once yearly

## 2024-05-09 NOTE — Assessment & Plan Note (Signed)
 Lab Results  Component Value Date   VITAMINB12 266 06/08/2023   Low, to start oral replacement - b12 1000 mcg every day x 6 mo

## 2024-05-09 NOTE — Progress Notes (Signed)
 Patient ID: Russell Sampson, male   DOB: Jan 18, 1951, 73 y.o.   MRN: [id number]         Chief Complaint:: yearly exam and Medical Management of Chronic Issues (6 Month follow up. Discuss zoloft  dosage)  , low vit d, pseudogout, prostate ca, dementia new onset, low b12, hyperglcemia, hld, anxiety with panic       HPI:  Russell Sampson is a 73 y.o. male here with wife, overall doing ok, but has new dx Dementia, with anxiety panic worsening with his awareness of new diagnosis and uncertain future.  Now taking colchicine 1 per day per rheum for pseudogout control.  Also has appt later today for new dementia with [facility].  Also sees Card Dr Burton with non obstructive CAD and HLD   Also sees Dr Renda Urology for prostate ca now once yearly only, with psa to be done here.  Pt denies chest pain, increased sob or doe, wheezing, orthopnea, PND, increased LE swelling, palpitations, dizziness or syncope.   Pt denies polydipsia, polyuria, or new focal neuro s/s.  Due for flu shot today   Wt Readings from Last 3 Encounters:  05/09/24 185 lb 9.6 oz (84.2 kg)  10/14/23 185 lb (83.9 kg)  09/17/23 187 lb (84.8 kg)   BP Readings from Last 3 Encounters:  05/09/24 (!) 154/92  10/14/23 138/78  09/17/23 139/75   Immunization History  Administered Date(s) Administered   Fluad Quad(high Dose 65+) 07/01/2021, 05/03/2023   Fluad Trivalent(High Dose 65+) 05/20/2023   INFLUENZA, HIGH DOSE SEASONAL PF 04/05/2019   Influenza Split 06/05/2012   Influenza,inj,Quad PF,6+ Mos 04/16/2015, 05/12/2017   Influenza-Unspecified 03/26/2016, 04/18/2018, 05/30/2022   Moderna Sars-Covid-2 Vaccination 10/13/2019, 11/11/2019   PFIZER(Purple Top)SARS-COV-2 Vaccination 08/01/2020   Pneumococcal Conjugate-13 07/04/2018   Pneumococcal Polysaccharide-23 06/18/2018, 04/24/2021   Tdap 08/03/2017   Zoster Recombinant(Shingrix) 07/10/2017, 08/24/2017   Zoster, Live 04/16/2015   Health Maintenance Due  Topic Date Due   Influenza  Vaccine  03/18/2024      Past Medical History:  Diagnosis Date   Arthritis of finger of left hand 07/08/2021   Benign essential tremor    Right-hand   Benign neoplasm of ascending colon    Benign neoplasm of cecum    Benign neoplasm of sigmoid colon    Benign neoplasm of transverse colon    Cataract    bilateral; removed   Cerumen impaction 08/15/2022   Chicken pox    Difficult airway for intubation 03/10/2019   Effusion of right knee joint 04/11/2020   Elbow pain 08/29/2013   Generalized anxiety disorder with panic attacks 01/30/2011   Greater trochanteric bursitis of right hip 03/21/2020   Heart murmur 12/14/2019   HLD (hyperlipidemia) 12/14/2019   Hyperglycemia 12/15/2020   Low back pain 11/17/2022   Mild dementia, concerns for Alzheimer's disease 03/14/2024   Obesity 08/26/2018   Personal history of colonic polyps 10/2007   3-4 small adenomas   Posterior neck pain 08/15/2022   Prostate cancer    Right knee pain 08/15/2022   Right lumbar radiculopathy 03/28/2020   Thumb tendonitis 07/21/2020   Vitamin D  deficiency 02/13/2022   Past Surgical History:  Procedure Laterality Date   CLOSED REDUCTION FOREARM FRACTURE     1962   COLONOSCOPY     COLONOSCOPY W/ POLYPECTOMY  10/22/2007   3-4 adenomas, max 6mm   COLONOSCOPY WITH PROPOFOL  N/A 05/30/2019   Procedure: COLONOSCOPY WITH PROPOFOL ;  Surgeon: Avram Lupita FORBES, MD;  Location: [facility];  Service:  Endoscopy;  Laterality: N/A;   COLONOSCOPY WITH PROPOFOL  N/A 09/03/2023   Procedure: COLONOSCOPY WITH PROPOFOL ;  Surgeon: Avram Lupita BRAVO, MD;  Location: [facility];  Service: Gastroenterology;  Laterality: N/A;   LYMPHADENECTOMY Bilateral 11/10/2016   Procedure: PELVIC LYMPHADENECTOMY;  Surgeon: Gretel Ferrara, MD;  Location: [facility];  Service: Urology;  Laterality: Bilateral;   POLYPECTOMY     POLYPECTOMY  05/30/2019   Procedure: POLYPECTOMY;  Surgeon: Avram Lupita BRAVO, MD;  Location: [facility];  Service: Endoscopy;;    POLYPECTOMY  09/03/2023   Procedure: POLYPECTOMY;  Surgeon: Avram Lupita BRAVO, MD;  Location: [facility];  Service: Gastroenterology;;   PROSTATE BIOPSY     09/19/2016   ROBOT ASSISTED LAPAROSCOPIC RADICAL PROSTATECTOMY N/A 11/10/2016   Procedure: XI ROBOTIC ASSISTED LAPAROSCOPIC RADICAL PROSTATECTOMY LEVEL 2;  Surgeon: Gretel Ferrara, MD;  Location: [facility];  Service: Urology;  Laterality: N/A;    reports that he has never smoked. He has never used smokeless tobacco. He reports that he does not drink alcohol and does not use drugs. family history includes Alzheimer's disease in his maternal aunt, mother, and another family member; Alzheimer's disease (age of onset: 40) in his brother; Anxiety disorder in his sister; Prostate cancer in his father; Stroke in his mother. No Known Allergies Current Outpatient Medications on File Prior to Visit  Medication Sig Dispense Refill   acetaminophen  (TYLENOL ) 500 MG tablet Take 1,000 mg by mouth every 6 (six) hours as needed.     colchicine 0.6 MG tablet Take 0.6 mg by mouth daily.     donepezil  (ARICEPT ) 10 MG tablet TAKE 1 TABLET(10 MG) BY MOUTH DAILY 30 tablet 4   donepezil  (ARICEPT ) 5 MG tablet Take 5 mg by mouth daily.     memantine  (NAMENDA ) 10 MG tablet TAKE 1 TABLET BY MOUTH TWICE DAILY 60 tablet 11   Menthol, Topical Analgesic, (BENGAY EX) Apply 1 application topically daily as needed (muscle pain).     naproxen  (NAPROSYN ) 500 MG tablet Take 1 tablet (500 mg total) by mouth 2 (two) times daily as needed. 180 tablet 0   rosuvastatin  (CRESTOR ) 40 MG tablet TAKE 1 TABLET(40 MG) BY MOUTH DAILY. PLEASE MAKE AN APPOINTMENT IN ORDER TO RECEIVE ADDITIONAL REFILLS, FIRST ATTEMPT 30 tablet 0   sertraline  (ZOLOFT ) 100 MG tablet TAKE 2 TABLETS(200 MG) BY MOUTH DAILY 180 tablet 3   No current facility-administered medications on file prior to visit.        ROS:  All others reviewed and negative.  Objective        PE:  BP (!) 154/92   Pulse 67   Temp 97.9  F (36.6 C)   Ht 5' 10 (1.778 m)   Wt 185 lb 9.6 oz (84.2 kg)   SpO2 98%   BMI 26.63 kg/m                 Constitutional: Pt appears in NAD               HENT: Head: NCAT.                Right Ear: External ear normal.                 Left Ear: External ear normal.                Eyes: . Pupils are equal, round, and reactive to light. Conjunctivae and EOM are normal  Nose: without d/c or deformity               Neck: Neck supple. Gross normal ROM               Cardiovascular: Normal rate and regular rhythm.                 Pulmonary/Chest: Effort normal and breath sounds without rales or wheezing.                Abd:  Soft, NT, ND, + BS, no organomegaly               Neurological: Pt is alert. At baseline orientation, motor grossly intact               Skin: Skin is warm. No rashes, no other new lesions, LE edema - none               Psychiatric: Pt behavior is normal without agitation   Micro: none  Cardiac tracings I have personally interpreted today:  none  Pertinent Radiological findings (summarize): none   Lab Results  Component Value Date   WBC 6.6 06/08/2023   HGB 14.1 06/08/2023   HCT 43.8 06/08/2023   PLT 246.0 06/08/2023   GLUCOSE 96 06/08/2023   CHOL 138 06/08/2023   TRIG 102.0 06/08/2023   HDL 49.20 06/08/2023   LDLCALC 68 06/08/2023   ALT 25 06/08/2023   AST 21 06/08/2023   NA 140 06/08/2023   K 4.4 06/08/2023   CL 104 06/08/2023   CREATININE 0.79 06/08/2023   BUN 14 06/08/2023   CO2 28 06/08/2023   TSH 3.41 06/08/2023   PSA 0.06 (L) 06/08/2023   HGBA1C 6.0 06/08/2023   Assessment/Plan:  Russell Sampson is a 73 y.o. White or Caucasian [1] male with  has a past medical history of Arthritis of finger of left hand (07/08/2021), Benign essential tremor, Benign neoplasm of ascending colon, Benign neoplasm of cecum, Benign neoplasm of sigmoid colon, Benign neoplasm of transverse colon, Cataract, Cerumen impaction (08/15/2022), Chicken pox,  Difficult airway for intubation (03/10/2019), Effusion of right knee joint (04/11/2020), Elbow pain (08/29/2013), Generalized anxiety disorder with panic attacks (01/30/2011), Greater trochanteric bursitis of right hip (03/21/2020), Heart murmur (12/14/2019), HLD (hyperlipidemia) (12/14/2019), Hyperglycemia (12/15/2020), Low back pain (11/17/2022), Mild dementia, concerns for Alzheimer's disease (03/14/2024), Obesity (08/26/2018), Personal history of colonic polyps (10/2007), Posterior neck pain (08/15/2022), Prostate cancer, Right knee pain (08/15/2022), Right lumbar radiculopathy (03/28/2020), Thumb tendonitis (07/21/2020), and Vitamin D  deficiency (02/13/2022).  Prostate cancer Also for psa today with labs, has been stable > 5 yrs, now sees urology only once yearly  Vitamin D  deficiency Last vitamin D  Lab Results  Component Value Date   VD25OH 49.46 06/08/2023   Stable, cont oral replacement   Pseudogout of knee, right Stable, cont colchicine and f/u rheum as planned  Mild dementia, concerns for Alzheimer's disease New onset, cont aricept , for f/u neurology later today  Hyperglycemia Lab Results  Component Value Date   HGBA1C 6.0 06/08/2023   Stable, pt to continue current medical treatment  - diet, wt control   HLD (hyperlipidemia) Lab Results  Component Value Date   LDLCALC 68 06/08/2023   Stable, pt to continue current statin 40 mg every day and f/u lab today   Generalized anxiety disorder with panic attacks With mild worsening, for add wellbutrin  xl 150 mg daily  B12 deficiency Lab Results  Component Value Date   VITAMINB12 266 06/08/2023  Low, to start oral replacement - b12 1000 mcg every day x 6 mo  Followup: Return in about 6 months (around 11/06/2024).  Lynwood Rush, MD 05/09/2024 9:08 AM [facility]

## 2024-05-09 NOTE — Assessment & Plan Note (Signed)
 Stable, cont colchicine and f/u rheum as planned

## 2024-05-09 NOTE — Progress Notes (Signed)
 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 RH male with a history of severe anxiety, hypertension, hyperlipidemia, and a diagnosis of mixed Alzheimer's dementia with anxiety per Neuropsych evaluation August 2025, vitamin D  deficiency, arthritis seen today in follow up for memory loss. Patient is currently on memantine  10 mg twice daily and donepezil  10 mg daily tolerating well.  Patient is able to participate on ADLs and to drive without significant difficulties.  Mood is anxious, he continues to attend counseling for anxiety, having started Wellbutrin .     Follow up in 6  months. Continue memantine  10 mg twice daily and donepezil  10 mg daily, side effects discussed  Therapy sessions [facility] Replenish B12 Recommend good control of her cardiovascular risk factors Continue to control mood as per PCP he is on Zoloft  and Wellbutrin  Monitor driving     Subjective:    This patient is accompanied in the office by his wife who supplements the history.  Previous records as well as any outside records available were reviewed prior to todays visit. Patient was last seen on 09/17/2023 with MMSE 29/30.    Any changes in memory since last visit?  About the same but the diagnosis of Alzheimer's has increasingly stressed him out -wife says, especially with processing information for example of how to turn the computer off.  Has some difficulty remembering recent conversations and names, but the decline is slow. Ever since his diagnosis he is frightened and researches the internet frequently.  repeats oneself?  Endorsed, constant anxiety Disoriented when walking into a room? Denies    Leaving objects?  May misplace things but not in unusual places   Wandering behavior?  denies   Any personality changes since last visit?  Denies.   Any worsening depression?:  Has a history of severe GAD, attends sessions at  [facility].   Hallucinations or paranoia?  Denies.   Seizures? denies    Any sleep changes?  Sleeps daily.  Sleeps denies vivid dreams, REM behavior or sleepwalking   Sleep apnea?   Denies.   Any hygiene concerns? Denies.  Independent of bathing and dressing?  Endorsed  Does the patient needs help with medications?  Wife is in charge   Who is in charge of the finances?  Wife is in charge     Any changes in appetite?  Denies.     Patient have trouble swallowing? Denies.   Does the patient cook? No Any headaches?   denies   Any vision changes?  Chronic back pain  denies   Ambulates with difficulty? Denies.    Recent falls or head injuries? Denies.     Unilateral weakness, numbness or tingling? denies   Any tremors?  Denies    Any anosmia?  Denies   Any incontinence of urine?  Endorsed   Any bowel dysfunction?   Denies      Patient lives with his wife     Does the patient drive?  Yes, occasionally he forgets what he is going, needing to navigate out more often than before.   Neuropsych evaluation 03/06/2024 Dr. Richie: Briefly, results suggested severe impairment surrounding cognitive flexibility and all aspects of learning and memory. Further performance variability was exhibited across processing speed and attention/concentration. Relative to his initial evaluation in July 2023, decline specific to memory is primarily exhibited across diminished recognition performances, suggesting a worsening storage impairment. A diminished retention rate  can also be seen across a shape learning task. Full amnesia across list and daily living memory tasks has been stable throughout all time points. Outside of memory, mild decline is exhibited across basic attention, cognitive flexibility, and semantic fluency. Other assessed domains have exhibited relative stability over time. Regarding the cause for his mild dementia presentation, concerns surrounding underlying Alzheimer's disease remain. Mr.  Sampson has a strong family history of this illness, including an early-onset presentation in his younger brother. Across memory testing, outside of some ability to retain story-based information, other retention rates ranged from 0% to 40%. He also consistently performed very poorly across recognition trials. Taken together, this suggests evidence for rapid forgetting and a pronounced storage impairment, both of which are the hallmark testing characteristics of this illness. Evidence for memory decline over the past several years (as well as semantic fluency decline) aligns with expectations of this illness. Current testing would suggest a slow rate of change over time.    History of Present Illness 2020 This is a very pleasant 73 year old right-handed man with a history of prostate cancer, anxiety, presenting for evaluation of memory concerns. He started becoming concerned due to his brother's diagnosis of early onset Alzheimer's dementia at age 73. His mother was diagnosed with dementia at age 18 and had it for 7 years until she passed away 13 years ago. He has 2 maternal aunts and a maternal cousin with dementia. This has caused him anxiety that he would develop dementia, he states right now his memory is pretty good and he has not noticed any memory changes himself. His wife has noticed that over the past couple of years, she would tell the same thing repeatedly, he says he is just trying to make a point. He drives without getting lost, his wife has noticed that sometimes he does not pay attention and she would notice he would be drifting or the car behind them honks when the light changes. He manages bills and medications without difficulties. He misplaces things and would totally freak out about it per wife. He has always been this way, but it is more than it used to be, now occurring at least once a week. She states that his personality has always been a very anxious individual, he is a very negative  individual, but over the past few months, this has worsened. He watches the 10pm news nightly and repeats twice during the visit that he has cut down on this. His wife states he thinks the world is a terrible place and he is adamant he goes with her when she goes out because someone may do something. He is not necessarily paranoid per wife, no hallucinations. She states he does not think the anxiety is a problem. She does not think the Prozac  is helping.     History of Present Illness 2020 This is a very pleasant 73 year old right-handed man with a history of prostate cancer, anxiety, presenting for evaluation of memory concerns. He started becoming concerned due to his brother's diagnosis of early onset Alzheimer's dementia at age 33. His mother was diagnosed with dementia at age 31 and had it for 7 years until she passed away 13 years ago. He has 2 maternal aunts and a maternal cousin with dementia. This has caused him anxiety that he would develop dementia, he states right now his memory is pretty good and he has not noticed any memory changes himself. His wife has noticed that over the past couple  of years, she would tell the same thing repeatedly, he says he is just trying to make a point. He drives without getting lost, his wife has noticed that sometimes he does not pay attention and she would notice he would be drifting or the car behind them honks when the light changes. He manages bills and medications without difficulties. He misplaces things and would totally freak out about it per wife. He has always been this way, but it is more than it used to be, now occurring at least once a week. She states that his personality has always been a very anxious individual, he is a very negative individual, but over the past few months, this has worsened. He watches the 10pm news nightly and repeats twice during the visit that he has cut down on this. His wife states he thinks the world is a terrible place and he  is adamant he goes with her when she goes out because someone may do something. He is not necessarily paranoid per wife, no hallucinations. She states he does not think the anxiety is a problem. She does not think the Prozac  is helping.    He denies any headaches, dizziness, vision changes, dysarthria/dysphagia, neck/back pain, focal numbness/tingling/weakness, bowel/bladder dysfunction, anosmia. He denies any tremors, his wife reminds him he has occasional right hand shaking that they had checked out several years ago, diagnosed as benign essential tremor. It does not affect writing or using utensils. He denies any falls. Sleep is good. No history of concussions or alcohol use. 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 He feels he was forced into early retirement. He does work around [employer] and with old classic cars, he helps care for his brother with dementia.   PREVIOUS MEDICATIONS:   CURRENT MEDICATIONS:  Outpatient Encounter Medications as of 05/09/2024  Medication Sig   acetaminophen  (TYLENOL ) 500 MG tablet Take 1,000 mg by mouth every 6 (six) hours as needed.   buPROPion  (WELLBUTRIN  XL) 150 MG 24 hr tablet Take 1 tablet (150 mg total) by mouth daily.   colchicine 0.6 MG tablet Take 0.6 mg by mouth daily.   cyanocobalamin  (VITAMIN B12) 1000 MCG tablet Take 1,000 mcg by mouth daily.   donepezil  (ARICEPT ) 10 MG tablet TAKE 1 TABLET(10 MG) BY MOUTH DAILY   meloxicam  (MOBIC ) 15 MG tablet Take 15 mg by mouth daily. For 7 days   memantine  (NAMENDA ) 10 MG tablet TAKE 1 TABLET BY MOUTH TWICE DAILY   Menthol, Topical Analgesic, (BENGAY EX) Apply 1 application topically daily as needed (muscle pain).   naproxen  (NAPROSYN ) 500 MG tablet Take 1 tablet (500 mg total) by mouth 2 (two) times daily as needed.   rosuvastatin  (CRESTOR ) 40 MG tablet TAKE 1 TABLET(40 MG) BY MOUTH DAILY. PLEASE MAKE AN APPOINTMENT IN ORDER TO RECEIVE  ADDITIONAL REFILLS, FIRST ATTEMPT   sertraline  (ZOLOFT ) 100 MG tablet TAKE 2 TABLETS(200 MG) BY MOUTH DAILY   donepezil  (ARICEPT ) 5 MG tablet Take 5 mg by mouth daily. (Patient not taking: Reported on 05/09/2024)   No facility-administered encounter medications on file as of 05/09/2024.       09/17/2023    9:00 AM 09/18/2022   12:00 PM  MMSE - Mini Mental State Exam  Orientation to time 5 5  Orientation to Place 5 5  Registration 3 3  Attention/ Calculation 5 5  Recall 2 1  Language- name 2 objects  2 2  Language- repeat 1 1  Language- follow 3 step command 3 3  Language- read & follow direction 1 1  Write a sentence 1 1  Copy design 1 1  Total score 29 28      02/07/2019    3:00 PM  Montreal Cognitive Assessment   Visuospatial/ Executive (0/5) 4  Naming (0/3) 3  Attention: Read list of digits (0/2) 2  Attention: Read list of letters (0/1) 1  Attention: Serial 7 subtraction starting at 100 (0/3) 3  Language: Repeat phrase (0/2) 1  Language : Fluency (0/1) 1  Abstraction (0/2) 0  Delayed Recall (0/5) 5  Orientation (0/6) 6  Total 26  Adjusted Score (based on education) 26    Objective:     PHYSICAL EXAMINATION:    VITALS:   Vitals:   05/09/24 1246  BP: (!) 163/68  Pulse: 76  SpO2: 98%  Weight: 185 lb (83.9 kg)  Height: 5' 10.5 (1.791 m)    GEN:  The patient appears stated age and is in NAD. HEENT:  Normocephalic, atraumatic.   Neurological examination:  General: NAD, well-groomed, appears stated age. Orientation: The patient is alert. Oriented to person, place and date Cranial nerves: There is good facial symmetry. The speech is fluent and clear. No aphasia or dysarthria. Fund of knowledge is appropriate. Recent and remote memory are impaired. Attention and concentration are reduced. Able to name objects and repeat phrases.  Hearing is intact to conversational tone.  Sensation: Sensation is intact to light touch throughout Motor: Strength is at least  antigravity x4. DTR's 2/4 in UE/LE     Movement examination: Tone: There is normal tone in the UE/LE Abnormal movements:  no tremor.  No myoclonus.  No asterixis.   Coordination:  There is no decremation with RAM's. Normal finger to nose  Gait and Station: The patient has no difficulty arising out of a deep-seated chair without the use of the hands. The patient's stride length is good.  Gait is cautious and narrow.    Thank you for allowing us  the opportunity to participate in the care of this nice patient. Please do not hesitate to contact us  for any questions or concerns.   Total time spent on today's visit was 38 minutes dedicated to this patient today, preparing to see patient, examining the patient, ordering tests and/or medications and counseling the patient, documenting clinical information in the EHR or other health record, independently interpreting results and communicating results to the patient/family, discussing treatment and goals, answering patient's questions and coordinating care.  Cc:  Norleen Lynwood ORN, MD  Camie Sevin 05/09/2024 1:08 PM

## 2024-05-09 NOTE — Assessment & Plan Note (Signed)
Last vitamin D Lab Results  Component Value Date   VD25OH 49.46 06/08/2023   Stable, cont oral replacement

## 2024-05-09 NOTE — Assessment & Plan Note (Signed)
Lab Results  Component Value Date   HGBA1C 6.0 06/08/2023   Stable, pt to continue current medical treatment  - diet, wt control

## 2024-05-09 NOTE — Patient Instructions (Addendum)
 It was a pleasure to see you today at our office.   Recommendations:  Follow up in 6 months  Continue memantine  10 mg twice daily Start Donepezil  10 mg daily  Recommend to continue psychotherapy  Continue Welbutrin and sertraline     RECOMMENDATIONS FOR ALL PATIENTS WITH MEMORY PROBLEMS: 1. Continue to exercise (Recommend 30 minutes of walking everyday, or 3 hours every week) 2. Increase social interactions - continue going to [place] and enjoy social gatherings with friends and family 3. Eat healthy, avoid fried foods and eat more fruits and vegetables 4. Maintain adequate blood pressure, blood sugar, and blood cholesterol level. Reducing the risk of stroke and cardiovascular disease also helps promoting better memory. 5. Avoid stressful situations. Live a simple life and avoid aggravations. Organize your time and prepare for the next day in anticipation. 6. Sleep well, avoid any interruptions of sleep and avoid any distractions in the bedroom that may interfere with adequate sleep quality 7. Avoid sugar, avoid sweets as there is a strong link between excessive sugar intake, diabetes, and cognitive impairment We discussed the Mediterranean diet, which has been shown to help patients reduce the risk of progressive memory disorders and reduces cardiovascular risk. This includes eating fish, eat fruits and green leafy vegetables, nuts like almonds and hazelnuts, walnuts, and also use olive oil. Avoid fast foods and fried foods as much as possible. Avoid sweets and sugar as sugar use has been linked to worsening of memory function.  There is always a concern of gradual progression of memory problems. If this is the case, then we may need to adjust level of care according to patient needs. Support, both to the patient and caregiver, should then be put into place.       FALL PRECAUTIONS: Be cautious when walking. Scan the area for obstacles that may increase the risk of trips and falls. When  getting up in the mornings, sit up at the edge of the bed for a few minutes before getting out of bed. Consider elevating the bed at the head end to avoid drop of blood pressure when getting up. Walk always in a well-lit room (use night lights in the walls). Avoid area rugs or power cords from appliances in the middle of the walkways. Use a walker or a cane if necessary and consider physical therapy for balance exercise. Get your eyesight checked regularly.  FINANCIAL OVERSIGHT: Supervision, especially oversight when making financial decisions or transactions is also recommended.  HOME SAFETY: Consider the safety of the kitchen when operating appliances like stoves, microwave oven, and blender. Consider having supervision and share cooking responsibilities until no longer able to participate in those. Accidents with firearms and other hazards in the house should be identified and addressed as well.   ABILITY TO BE LEFT ALONE: If patient is unable to contact 911 operator, consider using LifeLine, or when the need is there, arrange for someone to stay with patients. Smoking is a fire hazard, consider supervision or cessation. Risk of wandering should be assessed by caregiver and if detected at any point, supervision and safe proof recommendations should be instituted.  MEDICATION SUPERVISION: Inability to self-administer medication needs to be constantly addressed. Implement a mechanism to ensure safe administration of the medications.   DRIVING: Regarding driving, in patients with progressive memory problems, driving will be impaired. We advise to have someone else do the driving if trouble finding directions or if minor accidents are reported. Independent driving assessment is available to determine safety of driving.  If you are interested in the driving assessment, you can contact the following:  [facility] [employer] [facility] in [place] [phone]  [facility]  [phone]  [facility] [phone] or [phone]    Mediterranean Diet A Mediterranean diet refers to food and lifestyle choices that are based on the traditions of countries located on the [employer]. This way of eating has been shown to help prevent certain conditions and improve outcomes for people who have chronic diseases, like kidney disease and heart disease. What are tips for following this plan? Lifestyle  Cook and eat meals together with your family, when possible. Drink enough fluid to keep your urine clear or pale yellow. Be physically active every day. This includes: Aerobic exercise like running or swimming. Leisure activities like gardening, walking, or housework. Get 7-8 hours of sleep each night. If recommended by your health care provider, drink red wine in moderation. This means 1 glass a day for nonpregnant women and 2 glasses a day for men. A glass of wine equals 5 oz (150 mL). Reading food labels  Check the serving size of packaged foods. For foods such as rice and pasta, the serving size refers to the amount of cooked product, not dry. Check the total fat in packaged foods. Avoid foods that have saturated fat or trans fats. Check the ingredients list for added sugars, such as corn syrup. Shopping  At the grocery store, buy most of your food from the areas near the walls of the store. This includes: Fresh fruits and vegetables (produce). Grains, beans, nuts, and seeds. Some of these may be available in unpackaged forms or large amounts (in bulk). Fresh seafood. Poultry and eggs. Low-fat dairy products. Buy whole ingredients instead of prepackaged foods. Buy fresh fruits and vegetables in-season from local farmers markets. Buy frozen fruits and vegetables in resealable bags. If you do not have access to quality fresh seafood, buy precooked frozen shrimp or canned fish, such as tuna, salmon, or sardines. Buy  small amounts of raw or cooked vegetables, salads, or olives from the deli or salad bar at your store. Stock your pantry so you always have certain foods on hand, such as olive oil, canned tuna, canned tomatoes, rice, pasta, and beans. Cooking  Cook foods with extra-virgin olive oil instead of using butter or other vegetable oils. Have meat as a side dish, and have vegetables or grains as your main dish. This means having meat in small portions or adding small amounts of meat to foods like pasta or stew. Use beans or vegetables instead of meat in common dishes like chili or lasagna. Experiment with different cooking methods. Try roasting or broiling vegetables instead of steaming or sauteing them. Add frozen vegetables to soups, stews, pasta, or rice. Add nuts or seeds for added healthy fat at each meal. You can add these to yogurt, salads, or vegetable dishes. Marinate fish or vegetables using olive oil, lemon juice, garlic, and fresh herbs. Meal planning  Plan to eat 1 vegetarian meal one day each week. Try to work up to 2 vegetarian meals, if possible. Eat seafood 2 or more times a week. Have healthy snacks readily available, such as: Vegetable sticks with hummus. Greek yogurt. Fruit and nut trail mix. Eat balanced meals throughout the week. This includes: Fruit: 2-3 servings a day Vegetables: 4-5 servings a day Low-fat dairy: 2 servings a day Fish, poultry, or lean meat: 1 serving a day Beans and legumes: 2 or more  servings a week Nuts and seeds: 1-2 servings a day Whole grains: 6-8 servings a day Extra-virgin olive oil: 3-4 servings a day Limit red meat and sweets to only a few servings a month What are my food choices? Mediterranean diet Recommended Grains: Whole-grain pasta. Brown rice. Bulgar wheat. Polenta. Couscous. Whole-wheat bread. [place] Made[place]. Vegetables: Artichokes. Beets. Broccoli. Cabbage. Carrots. Eggplant. Green beans. Chard. Kale. Spinach. Onions. Leeks. Peas.  Squash. Tomatoes. Peppers. Radishes. Fruits: Apples. Apricots. Avocado. Berries. Bananas. Cherries. Dates. Figs. Grapes. Lemons. Melon. Oranges. Peaches. Plums. Pomegranate. Meats and other protein foods: Beans. Almonds. Sunflower seeds. Pine nuts. Peanuts. Cod. Salmon. Scallops. Shrimp. Tuna. Tilapia. Clams. Oysters. Eggs. Dairy: Low-fat milk. Cheese. Greek yogurt. Beverages: Water . Red wine. Herbal tea. Fats and oils: Extra virgin olive oil. Avocado oil. Grape seed oil. Sweets and desserts: [place] yogurt with honey. Baked apples. Poached pears. Trail mix. Seasoning and other foods: Basil. Cilantro. Coriander. Cumin. Mint. Parsley. Sage. Rosemary. Tarragon. Garlic. Oregano. Thyme. Pepper. Balsalmic vinegar. Tahini. Hummus. Tomato sauce. Olives. Mushrooms. Limit these Grains: Prepackaged pasta or rice dishes. Prepackaged cereal with added sugar. Vegetables: Deep fried potatoes (french fries). Fruits: Fruit canned in syrup. Meats and other protein foods: Beef. Pork. Lamb. Poultry with skin. Hot dogs. [place]. Dairy: Ice cream. Sour cream. Whole milk. Beverages: Juice. Sugar-sweetened soft drinks. Beer. Liquor and spirits. Fats and oils: Butter. Canola oil. Vegetable oil. Beef fat (tallow). Lard. Sweets and desserts: Cookies. Cakes. Pies. Candy. Seasoning and other foods: Mayonnaise. Premade sauces and marinades. The items listed may not be a complete list. Talk with your dietitian about what dietary choices are right for you. Summary The Mediterranean diet includes both food and lifestyle choices. Eat a variety of fresh fruits and vegetables, beans, nuts, seeds, and whole grains. Limit the amount of red meat and sweets that you eat. Talk with your health care provider about whether it is safe for you to drink red wine in moderation. This means 1 glass a day for nonpregnant women and 2 glasses a day for men. A glass of wine equals 5 oz (150 mL). This information is not intended to replace advice  given to you by your health care provider. Make sure you discuss any questions you have with your health care provider. Document Released: 03/27/2016 Document Revised: 04/29/2016 Document Reviewed: 03/27/2016 Elsevier Interactive Patient Education  2017 [employer].  We have sent a referral to [facility] for your MRI and they will call you directly to schedule your appointment. They are located at [address] [facility]. If you need to contact them directly please call [phone].

## 2024-05-09 NOTE — Assessment & Plan Note (Signed)
 New onset, cont aricept , for f/u neurology later today

## 2024-05-09 NOTE — Patient Instructions (Signed)
 You had the flu shot today  Please take all new medication as prescribed - the wellbutrin  XL 150 mg per day (which can be increased to 300 mg at some point if needed)  Please take OTC B12 1000 mcg per day for 6 months  Please continue all other medications as before, and refills have been done if requested.  Please have the pharmacy call with any other refills you may need.  Please continue your efforts at being more active, low cholesterol diet, and weight control.  You are otherwise up to date with prevention measures today.  Please keep your appointments with your specialists as you may have planned - Neurology later today  Please go to the LAB at the blood drawing area for the tests to be done  You will be contacted by phone if any changes need to be made immediately.  Otherwise, you will receive a letter about your results with an explanation, but please check with MyChart first.  Please make an Appointment to return in 6 months, or sooner if needed

## 2024-05-09 NOTE — Assessment & Plan Note (Signed)
 With mild worsening, for add wellbutrin  xl 150 mg daily

## 2024-05-13 ENCOUNTER — Ambulatory Visit: Admitting: Behavioral Health

## 2024-05-13 DIAGNOSIS — F411 Generalized anxiety disorder: Secondary | ICD-10-CM

## 2024-05-13 DIAGNOSIS — G3184 Mild cognitive impairment, so stated: Secondary | ICD-10-CM | POA: Diagnosis not present

## 2024-05-13 NOTE — Progress Notes (Signed)
 [facility] Counselor/Therapist Progress Note  Patient ID: Russell Sampson, MRN: [id number],    Date: 05/13/2024  Time Spent: 50 min Caregility video; Pt & Wife Russell Sampson are home in private & Provider working remotely from [employer]. Cpl understands the risks/limitations of telehealth & consents to Tx today.  Time In: 10:00am Time Out: 10:50am   Treatment Type: Cpl Th  Reported Symptoms: Elevated anx/dep & stress due to health status changes  Mental Status Exam: Appearance:  Casual     Behavior: Appropriate and Sharing  Motor: Normal  Speech/Language:  Clear and Coherent  Affect: Appropriate  Mood: anxious  Thought process: normal  Thought content:   WNL  Sensory/Perceptual disturbances:   WNL  Orientation: oriented to person, place, time/date, and situation  Attention: Good  Concentration: Good  Memory: WNL  Fund of knowledge:  Good  Insight:   Good  Judgment:  Good  Impulse Control: Good   Risk Assessment: Danger to Self:  No Self-injurious Behavior: No Danger to Others: No Duty to Warn:no Physical Aggression / Violence:No  Access to Firearms a concern: No  Gang Involvement:No   Subjective: Cpl is trying to manage the delivery of negative news by a Provider whose delivery was blunt in recent visit.    Interventions: Family Systems  Diagnosis:Mild cognitive impairment of uncertain or unknown etiology  Generalized anxiety disorder  Plan: Russell Sampson will focus on the positive aspects of his disease process. He will allow Wife Russell Sampson to drive on the [place]. They will cont to be social as time allows. They will focus on the good that is happening in their [facility]. He is trying to keep an attitude that promotes his best well being & the health of the marriage.   Target Date: 06/01/2024  Progress:6  Frequency: Once every 2-3 wks  Modality: Cpl Th  Russell LITTIE Ling, LMFT

## 2024-05-18 NOTE — Addendum Note (Signed)
 Addended by: Hadlei Stitt L on: 05/18/2024 09:56 AM   Modules accepted: Level of Service

## 2024-05-23 DIAGNOSIS — M1120 Other chondrocalcinosis, unspecified site: Secondary | ICD-10-CM | POA: Diagnosis not present

## 2024-05-23 DIAGNOSIS — Z6826 Body mass index (BMI) 26.0-26.9, adult: Secondary | ICD-10-CM | POA: Diagnosis not present

## 2024-05-23 DIAGNOSIS — M1991 Primary osteoarthritis, unspecified site: Secondary | ICD-10-CM | POA: Diagnosis not present

## 2024-05-23 DIAGNOSIS — Z79899 Other long term (current) drug therapy: Secondary | ICD-10-CM | POA: Diagnosis not present

## 2024-05-23 DIAGNOSIS — E663 Overweight: Secondary | ICD-10-CM | POA: Diagnosis not present

## 2024-05-24 ENCOUNTER — Other Ambulatory Visit: Payer: Self-pay | Admitting: Physician Assistant

## 2024-06-01 ENCOUNTER — Ambulatory Visit: Admitting: Behavioral Health

## 2024-06-01 DIAGNOSIS — F411 Generalized anxiety disorder: Secondary | ICD-10-CM

## 2024-06-01 DIAGNOSIS — G3184 Mild cognitive impairment, so stated: Secondary | ICD-10-CM

## 2024-06-01 NOTE — Progress Notes (Signed)
 [facility] Counselor/Therapist Progress Note  Patient ID: Russell Sampson, MRN: [id number],    Date: 06/01/2024  Time Spent: 50 min Caregility video/TC due to no Invite to join the visit; Cpl is home in private & Provider working remotely from [employer]. Cpl is aware of the risks/limitations of telehealth & consents to Tx today.  Time In: 10:00am Time Out: 10:50am  Treatment Type: Cpl Th  Reported Symptoms: Elevated anx/dep & stress due to excess worry about things happening in life  Mental Status Exam: Appearance:  Casual     Behavior: Appropriate and Sharing  Motor: Normal  Speech/Language:  Clear and Coherent  Affect: Appropriate  Mood: normal  Thought process: normal  Thought content:   WNL  Sensory/Perceptual disturbances:   WNL  Orientation: oriented to person, place, time/date, and situation  Attention: Good  Concentration: Good  Memory: WNL  Fund of knowledge:  Good  Insight:   Good  Judgment:  Good  Impulse Control: Good   Risk Assessment: Danger to Self:  No Self-injurious Behavior: No Danger to Others: No Duty to Warn:no Physical Aggression / Violence:No  Access to Firearms a concern: No  Gang Involvement:No   Subjective: Pt is perseverating on the Physician visit that was disturbing a month ago. He is adjusting to the changes that are happening cognitively. Wife is also trying to adjust to these changes.   Cpl is helping w/the Tommi Cheng that happens this wknd. This is keeping them busy & social.  Interventions: Family Systems and Caregiving psychoedu  Diagnosis:Mild cognitive impairment of uncertain or unknown etiology  Generalized anxiety disorder  Plan: [name] & Charlena will cont to be social & attend Church @ [employer] for the upcoming Annual Oct Fall Festival. [name] will try to use the transitioning/re-directing skill taught today to prevent some of the perseverating beh that is difficult for her to cope with daily. We  will discuss her progress w/this next session.   Target Date:07/02/2024  Progress: 6  Frequency: Once every 2 wks  Modality: Cpl Th  [name], LMFT

## 2024-06-06 ENCOUNTER — Ambulatory Visit: Admitting: Physician Assistant

## 2024-06-14 DIAGNOSIS — D487 Neoplasm of uncertain behavior of other specified sites: Secondary | ICD-10-CM | POA: Diagnosis not present

## 2024-06-20 ENCOUNTER — Other Ambulatory Visit: Payer: Self-pay | Admitting: Adult Health

## 2024-06-23 ENCOUNTER — Other Ambulatory Visit: Payer: Self-pay | Admitting: Cardiovascular Disease

## 2024-06-27 NOTE — Progress Notes (Unsigned)
 Cardiology Office Note:  .   Date:  06/29/2024  ID:  Russell Sampson, DOB 1951-07-20, MRN [id number] PCP: Norleen Lynwood ORN, MD  [facility] Providers Cardiologist:  Darryle ONEIDA Decent, MD { History of Present Illness: .    Chief Complaint  Patient presents with   Follow-up         Russell Sampson is a 73 y.o. male with history of CAD who presents for follow-up.    History of Present Illness   Russell Sampson is a 73 year old male with coronary artery disease who presents for a follow-up visit.  He has a history of coronary artery disease with an elevated coronary calcium  score of 777, placing him in the 84th percentile. Despite this, his stress test was normal. He is currently on Crestor  40 mg daily, and his most recent LDL cholesterol level was 58, which is at goal. No chest pain or trouble breathing is reported, and he maintains an active lifestyle, engaging in yoga regularly except during the winter months.  He has been diagnosed with Alzheimer's disease and is currently living at home. His wife notes that he has 'good days and bad days' but is managing with the help of a counselor. He has been prescribed Wellbutrin , which has helped stabilize his mood, reducing worry and agitation.  He has a history of mild to moderate mitral valve regurgitation, first noted in 2023. He has not experienced any symptoms related to this condition. He is not on aspirin  therapy.  He experiences white coat hypertension, with elevated blood pressure readings in medical settings, but normal readings otherwise. He is not diabetic or prediabetic, and there have been no recent updates to his medical history.          Problem List CAD -CAC 777 (84th percentile)  -normal MPI 03/31/2022 2. HLD -T chol 119, HDL 52, LDL 58, TG 46 -Lpa 92 3. Alzheimer's  4. Mitral regurgitation  -mild to moderate 2023 5. White coat HTN    ROS: All other ROS reviewed and negative. Pertinent positives noted in  the HPI.     Studies Reviewed: Russell Sampson   EKG Interpretation Date/Time:  Wednesday June 29 2024 08:52:25 EST Ventricular Rate:  63 PR Interval:  170 QRS Duration:  88 QT Interval:  430 QTC Calculation: 440 R Axis:   48  Text Interpretation: Normal sinus rhythm Possible Left atrial enlargement Nonspecific ST and T wave abnormality Confirmed by Decent Darryle [phone]) on 06/29/2024 9:11:01 AM   NM Stress 03/31/2022   Nuclear stress EF: 64 %. The left ventricular ejection fraction is normal (55-65%).   Sinus rhythm at rest.  During stress, at 6 minutes and 50 seconds, 2 mm of horizontal ST segment depression seen in V4 and V5.   There is normal perfusion uptake seen at both rest and stress.  There is no evidence of ischemia or infarction on perfusion imaging.  There is no transient ischemic dilatation (1.06)   Stress test was stopped secondary to fatigue.  No chest discomfort was reported.   Abnormal exercise stress test portion of study.  Normal nuclear perfusion study at both rest and stress.  Overall low risk study, possible false positive exercise stress test portion.   If symptoms worsen or become more worrisome, further cardiac testing may be warranted in the setting of high coronary calcium  score. Physical Exam:   VS:  BP (!) 140/68 (BP Location: Left Arm, Cuff Size: Normal)   Pulse 63  Ht 5' 11 (1.803 m)   Wt 185 lb 6 oz (84.1 kg)   BMI 25.85 kg/m    Wt Readings from Last 3 Encounters:  06/29/24 185 lb 6 oz (84.1 kg)  05/09/24 185 lb (83.9 kg)  05/09/24 185 lb 9.6 oz (84.2 kg)    GEN: Well nourished, well developed in no acute distress NECK: No JVD; No carotid bruits CARDIAC: RRR, 3/6 HSM, rubs, gallops RESPIRATORY:  Clear to auscultation without rales, wheezing or rhonchi  ABDOMEN: Soft, non-tender, non-distended EXTREMITIES:  No edema; No deformity  ASSESSMENT AND PLAN: .   Assessment and Plan    Atherosclerotic heart disease of native coronary artery without  angina Coronary artery disease with elevated coronary calcium  score of 777 (84th percentile). Normal stress test two years ago. No current angina or dyspnea. LDL cholesterol at goal with current medication regimen. Aspirin  therapy not initiated due to dementia and preference to minimize medications. - Continue Crestor  40 mg daily. - Held aspirin  therapy due to preference.   Nonrheumatic mitral valve regurgitation Mild to moderate mitral valve regurgitation noted in 2023. Prominent murmur on examination today. No significant symptoms reported. - Ordered echocardiogram to assess mitral valve regurgitation.  Pure hypercholesterolemia LDL cholesterol at goal with current statin therapy. No issues reported with medication. - Continue current statin therapy.  White coat hypertension Blood pressure elevated in clinical settings due to white coat anxiety. No issues reported outside of clinical settings. - Continue to monitor blood pressure in clinical settings.              Follow-up: Return in about 1 year (around 06/29/2025).  Signed, Darryle DASEN. Barbaraann, MD, [facility]  [facility]  [address] [place], [place] [address] [phone]  9:38 AM

## 2024-06-28 ENCOUNTER — Ambulatory Visit (INDEPENDENT_AMBULATORY_CARE_PROVIDER_SITE_OTHER): Admitting: Behavioral Health

## 2024-06-28 DIAGNOSIS — F411 Generalized anxiety disorder: Secondary | ICD-10-CM | POA: Diagnosis not present

## 2024-06-28 DIAGNOSIS — G3184 Mild cognitive impairment, so stated: Secondary | ICD-10-CM

## 2024-06-29 ENCOUNTER — Ambulatory Visit: Attending: Student in an Organized Health Care Education/Training Program | Admitting: Cardiovascular Disease

## 2024-06-29 ENCOUNTER — Encounter: Payer: Self-pay | Admitting: Cardiovascular Disease

## 2024-06-29 VITALS — BP 140/68 | HR 63 | Ht 71.0 in | Wt 185.4 lb

## 2024-06-29 DIAGNOSIS — I2510 Atherosclerotic heart disease of native coronary artery without angina pectoris: Secondary | ICD-10-CM | POA: Insufficient documentation

## 2024-06-29 DIAGNOSIS — I340 Nonrheumatic mitral (valve) insufficiency: Secondary | ICD-10-CM | POA: Diagnosis not present

## 2024-06-29 DIAGNOSIS — E7800 Pure hypercholesterolemia, unspecified: Secondary | ICD-10-CM | POA: Diagnosis not present

## 2024-06-29 NOTE — Patient Instructions (Addendum)
 Medication Instructions:  Continue same medications *If you need a refill on your cardiac medications before your next appointment, please call your pharmacy*  Lab Work: None ordered  Testing/Procedures: Echo  first available   Follow-Up: At [facility], you and your health needs are our priority.  As part of our continuing mission to provide you with exceptional heart care, our providers are all part of one team.  This team includes your primary Cardiologist (physician) and Advanced Practice Providers or APPs (Physician Assistants and Nurse Practitioners) who all work together to provide you with the care you need, when you need it.  Your next appointment:  1 year    Call in July to schedule Nov appointment     Provider:  PA    We recommend signing up for the patient portal called MyChart.  Sign up information is provided on this After Visit Summary.  MyChart is used to connect with patients for Virtual Visits (Telemedicine).  Patients are able to view lab/test results, encounter notes, upcoming appointments, etc.  Non-urgent messages can be sent to your provider as well.   To learn more about what you can do with MyChart, go to [email or web address].

## 2024-06-29 NOTE — Progress Notes (Signed)
 [facility] Counselor/Therapist Progress Note  Patient ID: Russell Sampson, MRN: [id number],    Date: 06/28/2024  Time Spent: 50 min In Person @ [facility] Office Time In: 10:00am Time Out: 10:50am   Treatment Type: Cpl Th  Reported Symptoms: Reduction in anx/dep & stress due to health status changes in the past 18 mos  MSE based on Pt status only:  Mental Status Exam: Appearance:  Casual     Behavior: Appropriate and Sharing  Motor: Normal  Speech/Language:  Clear and Coherent  Affect: Appropriate  Mood: anxious  Thought process: normal  Thought content:   Rumination  Sensory/Perceptual disturbances:   WNL  Orientation: oriented to person, place, time/date, and situation  Attention: Good  Concentration: Good  Memory: WNL  Fund of knowledge:  Good  Insight:   Fair  Judgment:  Good  Impulse Control: Good   Risk Assessment: Danger to Self:  No Self-injurious Behavior: No Danger to Others: No Duty to Warn:no Physical Aggression / Violence:No  Access to Firearms a concern: No  Gang Involvement:No   Subjective: Pt has reduction in levels of anxiety today usually presented in session. He is light-hearted & upbeat today. Wife sts the Wellbutrin  is really assisting him to cope better. Wife relates today she is also taking Wellbutrin  & it has helped the relationship by reducing the stress & repetitive beh on the part of Pt. They are both coping much better since inititation of this medication & consistent trips to psychotherapy. The arguments have substantially decreased. Sessions are supportive of both Spouses.  Cpl reviewed the extensive Hx of Alzheimer's in the Family System, both Maternally on Pt's side & Paternally; Pt's Siblings are & have Hx of impact from this disease process.   Wife also details the LT Care they have established via financial planning.  Interventions: Family Systems, Caregiver Support  Diagnosis:Mild cognitive impairment of uncertain  or unknown etiology  Generalized anxiety disorder  Plan: Russell Sampson & Russell Sampson will cont to attend our sessions together for support in this process & to meet the needs, offer coping tools for them to manage new issues & support the issues already discussed. Russell Sampson is tearful today in part due to the discussion about Family & Hx of disease. Russell Sampson's Bros died an agonizing death @ the age of 71yo-they want to avoid this end of life misery.   Target Date:07/18/2024  Progress: 7  Frequency: Once every 2-3 wks  Modality: Cpl Th  [name], LMFT

## 2024-07-12 ENCOUNTER — Ambulatory Visit: Admitting: Behavioral Health

## 2024-07-12 DIAGNOSIS — F411 Generalized anxiety disorder: Secondary | ICD-10-CM

## 2024-07-12 DIAGNOSIS — G3184 Mild cognitive impairment, so stated: Secondary | ICD-10-CM | POA: Diagnosis not present

## 2024-07-12 NOTE — Progress Notes (Signed)
"    [facility] Counselor/Therapist Progress Note  Patient ID: Russell Sampson, MRN: [id number],    Date: 08/25/2024  Time Spent: 50 min In Person @ [facility] Office Time In: 1:00pm Time Out: 1:50pm   Treatment Type: Cpl Th  Reported Symptoms: Pt is exp'g heightened death anxiety due to rumination over Family Hx. Wife Marval has noted changes cognitively; more concrete thinking w/reduction in abstraction, & lowered retention of information.   Mental Status Exam: Appearance:  Casual and Neat     Behavior: Appropriate and Sharing  Motor: Normal  Speech/Language:  Clear and Coherent  Affect: Congruent  Mood: anxious, depressed, and sad  Thought process: Dx of MCI  Thought content:   Rumination  Sensory/Perceptual disturbances:   WNL  Orientation: oriented to person, place, time/date, and situation  Attention: Good  Concentration: Fair  Memory: Dx of MCI  Fund of knowledge:  Good  Insight:   Poor  Judgment:  Fair  Impulse Control: Good   Risk Assessment: Danger to Self:  No Self-injurious Behavior: No Danger to Others: No Duty to Warn:no Physical Aggression / Violence:No  Access to Firearms a concern: No  Gang Involvement:No   Subjective: Pt is perseverative re: the death of his Terresa Mott @ [phone]. His decline was stark & the last week it was devastating to the Cpl who both express this today. Wife Marval is a Ret'd [employer] & knows the medical implications of MCI well. She is practical in her approach to Tom's concerns, his repetitiveness, & his conversational deficits.   Interventions: Ego-Supportive and Family Systems, Normalization of the current health status of Tom for [name] w/addt'l psychoedu as needed  Diagnosis:Mild cognitive impairment of uncertain or unknown etiology  Generalized anxiety disorder  Plan: Charlena will cont to keep himself busy in the home, reduce his consumption of the [employer] daily, & engage in positive ways w/his Family. Marval may be able to  find a Support Grp for Caregivers as she is given a flyer today from Clinician. The Grp may also have Virtual options if she cannot leave the home setting.  Target Date: 08/01/2024  Progress: 6  Frequency: Once every 2-3 wks & through the Holidays as possible  Modality: Cpl Th  [name], LMFT    "

## 2024-07-19 ENCOUNTER — Other Ambulatory Visit: Payer: Self-pay | Admitting: Physician Assistant

## 2024-07-19 ENCOUNTER — Other Ambulatory Visit: Payer: Self-pay | Admitting: Cardiovascular Disease

## 2024-07-27 ENCOUNTER — Ambulatory Visit: Admitting: Behavioral Health

## 2024-08-08 ENCOUNTER — Ambulatory Visit (HOSPITAL_COMMUNITY)

## 2024-08-24 ENCOUNTER — Ambulatory Visit: Admitting: Behavioral Health

## 2024-09-02 ENCOUNTER — Ambulatory Visit

## 2024-09-02 VITALS — BP 138/62 | HR 78 | Temp 97.8°F | Ht 70.5 in | Wt 187.8 lb

## 2024-09-02 DIAGNOSIS — Z0000 Encounter for general adult medical examination without abnormal findings: Secondary | ICD-10-CM

## 2024-09-02 NOTE — Patient Instructions (Signed)
 Russell Sampson,  Thank you for taking the time for your Medicare Wellness Visit. I appreciate your continued commitment to your health goals. Please review the care plan we discussed, and feel free to reach out if I can assist you further.  Please note that Annual Wellness Visits do not include a physical exam. Some assessments may be limited, especially if the visit was conducted virtually. If needed, we may recommend an in-person follow-up with your provider.  Ongoing Care Seeing your primary care provider every 3 to 6 months helps us  monitor your health and provide consistent, personalized care.   Referrals If a referral was made during today's visit and you haven't received any updates within two weeks, please contact the referred provider directly to check on the status.  Recommended Screenings:  Health Maintenance  Topic Date Due   Medicare Annual Wellness Visit  08/24/2024   DTaP/Tdap/Td vaccine (2 - Td or Tdap) 08/04/2027   Colon Cancer Screening  09/02/2028   Pneumococcal Vaccine for age over 32  Completed   Flu Shot  Completed   Hepatitis C Screening  Completed   Zoster (Shingles) Vaccine  Completed   Meningitis B Vaccine  Aged Out   COVID-19 Vaccine  Discontinued       09/01/2024    1:02 PM  Advanced Directives  Does Patient Have a Medical Advance Directive? Yes  Type of Estate Agent of [place];Living will  Copy of Healthcare Power of Attorney in Chart? Yes - validated most recent copy scanned in chart (See row information)    Vision: Annual vision screenings are recommended for early detection of glaucoma, cataracts, and diabetic retinopathy. These exams can also reveal signs of chronic conditions such as diabetes and high blood pressure.  Dental: Annual dental screenings help detect early signs of oral cancer, gum disease, and other conditions linked to overall health, including heart disease and diabetes.  Please see the attached documents for  additional preventive care recommendations.

## 2024-09-02 NOTE — Progress Notes (Signed)
 "  Chief Complaint  Patient presents with   Medicare Wellness     Subjective:   Russell Sampson is a 74 y.o. male who presents for a Medicare Annual Wellness Visit.  Visit info / Clinical Intake: Medicare Wellness Visit Type:: Subsequent Annual Wellness Visit Persons participating in visit and providing information:: patient & caregiver Medicare Wellness Visit Mode:: In-person (required for WTM) Interpreter Needed?: No Pre-visit prep was completed: yes AWV questionnaire completed by patient prior to visit?: yes Date:: 09/01/24 Living arrangements:: (Patient-Rptd) lives with spouse/significant other Patient's Overall Health Status Rating: (Patient-Rptd) very good Typical amount of pain: (Patient-Rptd) none Does pain affect daily life?: (Patient-Rptd) no Are you currently prescribed opioids?: no  Dietary Habits and Nutritional Risks How many meals a day?: (Patient-Rptd) 3 Eats fruit and vegetables daily?: (!) (Patient-Rptd) no Most meals are obtained by: (Patient-Rptd) having others provide food In the last 2 weeks, have you had any of the following?: none Diabetic:: no  Functional Status Activities of Daily Living (to include ambulation/medication): (Patient-Rptd) Independent Ambulation: (Patient-Rptd) Independent Medication Administration: (Patient-Rptd) Needs assistance (comment) Home Management (perform basic housework or laundry): (Patient-Rptd) Independent Manage your own finances?: (Patient-Rptd) yes Primary transportation is: (Patient-Rptd) driving Concerns about vision?: no *vision screening is required for WTM* Concerns about hearing?: no  Fall Screening Falls in the past year?: 1 (tripped) Number of falls in past year: (Patient-Rptd) 0 Was there an injury with Fall?: (Patient-Rptd) 0 Fall Risk Category Calculator: (Patient-Rptd) 1 Patient Fall Risk Level: (Patient-Rptd) Low Fall Risk  Fall Risk Patient at Risk for Falls Due to: Medication side effect Fall risk  Follow up: Falls prevention discussed; Education provided; Falls evaluation completed  Home and Transportation Safety: All rugs have non-skid backing?: (Patient-Rptd) N/A, no rugs All stairs or steps have railings?: (Patient-Rptd) N/A, no stairs Grab bars in the bathtub or shower?: (!) (Patient-Rptd) no Have non-skid surface in bathtub or shower?: (Patient-Rptd) yes Good home lighting?: (Patient-Rptd) yes Regular seat belt use?: (Patient-Rptd) yes Hospital stays in the last year:: (Patient-Rptd) no  Cognitive Assessment Difficulty concentrating, remembering, or making decisions? : (Patient-Rptd) yes Will 6CIT or Mini Cog be Completed: no 6CIT or Mini Cog Declined: patient has a diagnosis of dementia or cognitive impairment  Advance Directives (For Healthcare) Does Patient Have a Medical Advance Directive?: Yes Does patient want to make changes to medical advance directive?: No - Patient declined Type of Advance Directive: Healthcare Power of [place]; Living will Copy of Healthcare Power of Attorney in Chart?: Yes - validated most recent copy scanned in chart (See row information) Copy of Living Will in Chart?: Yes - validated most recent copy scanned in chart (See row information)  Reviewed/Updated  Reviewed/Updated: Reviewed All (Medical, Surgical, Family, Medications, Allergies, Care Teams, Patient Goals)    Allergies (verified) Patient has no known allergies.   Current Medications (verified) Outpatient Encounter Medications as of 09/02/2024  Medication Sig   acetaminophen  (TYLENOL ) 500 MG tablet Take 1,000 mg by mouth every 6 (six) hours as needed.   buPROPion  (WELLBUTRIN  XL) 150 MG 24 hr tablet Take 1 tablet (150 mg total) by mouth daily.   colchicine 0.6 MG tablet Take 0.6 mg by mouth daily.   cyanocobalamin  (VITAMIN B12) 1000 MCG tablet Take 1,000 mcg by mouth daily.   donepezil  (ARICEPT ) 10 MG tablet TAKE 1 TABLET(10 MG) BY MOUTH DAILY   memantine  (NAMENDA ) 10 MG tablet  TAKE 1 TABLET BY MOUTH TWICE DAILY   Menthol, Topical Analgesic, (BENGAY EX) Apply 1 application topically  daily as needed (muscle pain).   naproxen  (NAPROSYN ) 500 MG tablet Take 1 tablet (500 mg total) by mouth 2 (two) times daily as needed.   rosuvastatin  (CRESTOR ) 40 MG tablet Take 1 tablet (40 mg total) by mouth daily.   sertraline  (ZOLOFT ) 100 MG tablet TAKE 2 TABLETS(200 MG) BY MOUTH DAILY   donepezil  (ARICEPT ) 5 MG tablet Take 5 mg by mouth daily. (Patient not taking: Reported on 05/09/2024)   meloxicam  (MOBIC ) 15 MG tablet Take 15 mg by mouth daily. For 7 days (Patient not taking: Reported on 09/02/2024)   No facility-administered encounter medications on file as of 09/02/2024.    History: Past Medical History:  Diagnosis Date   Arthritis of finger of left hand 07/08/2021   Benign essential tremor    Right-hand   Benign neoplasm of ascending colon    Benign neoplasm of cecum    Benign neoplasm of sigmoid colon    Benign neoplasm of transverse colon    Cataract    bilateral; removed   Cerumen impaction 08/15/2022   Chicken pox    Difficult airway for intubation 03/10/2019   Effusion of right knee joint 04/11/2020   Elbow pain 08/29/2013   Generalized anxiety disorder with panic attacks 01/30/2011   Greater trochanteric bursitis of right hip 03/21/2020   Heart murmur 12/14/2019   HLD (hyperlipidemia) 12/14/2019   Hyperglycemia 12/15/2020   Low back pain 11/17/2022   Mild dementia, concerns for Alzheimer's disease 03/14/2024   Obesity 08/26/2018   Personal history of colonic polyps 10/2007   3-4 small adenomas   Posterior neck pain 08/15/2022   Prostate cancer    Right knee pain 08/15/2022   Right lumbar radiculopathy 03/28/2020   Thumb tendonitis 07/21/2020   Vitamin D  deficiency 02/13/2022   Past Surgical History:  Procedure Laterality Date   CLOSED REDUCTION FOREARM FRACTURE     1962   COLONOSCOPY     COLONOSCOPY W/ POLYPECTOMY  10/22/2007   3-4 adenomas, max 6mm    COLONOSCOPY WITH PROPOFOL  N/A 05/30/2019   Procedure: COLONOSCOPY WITH PROPOFOL ;  Surgeon: Avram Lupita BRAVO, MD;  Location: [facility];  Service: Endoscopy;  Laterality: N/A;   COLONOSCOPY WITH PROPOFOL  N/A 09/03/2023   Procedure: COLONOSCOPY WITH PROPOFOL ;  Surgeon: Avram Lupita BRAVO, MD;  Location: [facility];  Service: Gastroenterology;  Laterality: N/A;   LYMPHADENECTOMY Bilateral 11/10/2016   Procedure: PELVIC LYMPHADENECTOMY;  Surgeon: Gretel Ferrara, MD;  Location: [facility];  Service: Urology;  Laterality: Bilateral;   POLYPECTOMY     POLYPECTOMY  05/30/2019   Procedure: POLYPECTOMY;  Surgeon: Avram Lupita BRAVO, MD;  Location: [facility];  Service: Endoscopy;;   POLYPECTOMY  09/03/2023   Procedure: POLYPECTOMY;  Surgeon: Avram Lupita BRAVO, MD;  Location: [facility];  Service: Gastroenterology;;   PROSTATE BIOPSY     09/19/2016   ROBOT ASSISTED LAPAROSCOPIC RADICAL PROSTATECTOMY N/A 11/10/2016   Procedure: XI ROBOTIC ASSISTED LAPAROSCOPIC RADICAL PROSTATECTOMY LEVEL 2;  Surgeon: Gretel Ferrara, MD;  Location: [facility];  Service: Urology;  Laterality: N/A;   Family History  Problem Relation Age of Onset   Alzheimer's disease Mother    Stroke Mother    Prostate cancer Father    Cancer Father    Anxiety disorder Sister    Alzheimer's disease Brother 72       early-onset   Alzheimer's disease Maternal Aunt    Alzheimer's disease Other        Maternal aunt's daughter   Colon cancer Neg Hx  Colon polyps Neg Hx    Esophageal cancer Neg Hx    Rectal cancer Neg Hx    Stomach cancer Neg Hx    Social History   Occupational History   Occupation: Retired  Tobacco Use   Smoking status: Never   Smokeless tobacco: Never  Vaping Use   Vaping status: Never Used  Substance and Sexual Activity   Alcohol use: Never   Drug use: Never   Sexual activity: Not Currently    Partners: Female    Birth control/protection: None    Comment: Married   Tobacco Counseling Counseling given: Not  Answered  SDOH Screenings   Food Insecurity: No Food Insecurity (09/01/2024)  Housing: Low Risk (09/01/2024)  Transportation Needs: No Transportation Needs (09/01/2024)  Utilities: Not At Risk (09/02/2024)  Alcohol Screen: Low Risk (09/01/2024)  Depression (PHQ2-9): Low Risk (09/02/2024)  Financial Resource Strain: Low Risk (09/01/2024)  Physical Activity: Insufficiently Active (09/01/2024)  Social Connections: Socially Integrated (09/01/2024)  Stress: No Stress Concern Present (09/01/2024)  Tobacco Use: Low Risk (09/02/2024)  Health Literacy: Inadequate Health Literacy (09/02/2024)   See flowsheets for full screening details  Depression Screen PHQ 2 & 9 Depression Scale- Over the past 2 weeks, how often have you been bothered by any of the following problems? Little interest or pleasure in doing things: 0 Feeling down, depressed, or hopeless (PHQ Adolescent also includes...irritable): 0 PHQ-2 Total Score: 0 Trouble falling or staying asleep, or sleeping too much: 0 Feeling tired or having little energy: 0 Poor appetite or overeating (PHQ Adolescent also includes...weight loss): 0 Feeling bad about yourself - or that you are a failure or have let yourself or your family down: 0 Trouble concentrating on things, such as reading the newspaper or watching television (PHQ Adolescent also includes...like school work): 0 Moving or speaking so slowly that other people could have noticed. Or the opposite - being so fidgety or restless that you have been moving around a lot more than usual: 0 Thoughts that you would be better off dead, or of hurting yourself in some way: 0 PHQ-9 Total Score: 0 If you checked off any problems, how difficult have these problems made it for you to do your work, take care of things at home, or get along with other people?: Not difficult at all  Depression Treatment Depression Interventions/Treatment : [id number] Score <4 Follow-up Not Indicated     Goals Addressed              This Visit's Progress    DIET - INCREASE WATER  INTAKE               Objective:    Today's Vitals   09/02/24 1345  BP: 138/62  Pulse: 78  Temp: 97.8 F (36.6 C)  TempSrc: Oral  SpO2: 97%  Weight: 187 lb 12.8 oz (85.2 kg)  Height: 5' 10.5 (1.791 m)   Body mass index is 26.57 kg/m.  Hearing/Vision screen Vision Screening - Comments:: Regular eye exams Immunizations and Health Maintenance Health Maintenance  Topic Date Due   Medicare Annual Wellness (AWV)  09/02/2025   DTaP/Tdap/Td (2 - Td or Tdap) 08/04/2027   Colonoscopy  09/02/2028   Pneumococcal Vaccine: 50+ Years  Completed   Influenza Vaccine  Completed   Hepatitis C Screening  Completed   Zoster Vaccines- Shingrix  Completed   Meningococcal B Vaccine  Aged Out   COVID-19 Vaccine  Discontinued        Assessment/Plan:  This is a routine wellness examination  for [place].  Patient Care Team: Norleen Lynwood ORN, MD as PCP - General (Internal Medicine) O'Neal, Darryle Ned, MD as PCP - Cardiology (Cardiology) Georjean Darice HERO, MD as Consulting Physician (Neurology) Pa, [facility] Ishmael Slough, MD as Consulting Physician (Rheumatology)  I have personally reviewed and noted the following in the patients chart:   Medical and social history Use of alcohol, tobacco or illicit drugs  Current medications and supplements including opioid prescriptions. Functional ability and status Nutritional status Physical activity Advanced directives List of other physicians Hospitalizations, surgeries, and ER visits in previous 12 months Vitals Screenings to include cognitive, depression, and falls Referrals and appointments  No orders of the defined types were placed in this encounter.  In addition, I have reviewed and discussed with patient certain preventive protocols, quality metrics, and best practice recommendations. A written personalized care plan for preventive services as well as general  preventive health recommendations were provided to patient.   Ardella FORBES Dawn, LPN   [id number]/83/[id number]   Return in 1 year (on 09/02/2025).  After Visit Summary: (In Person-Printed) AVS printed and given to the patient  Nurse Notes: No voiced or noted concerns at this time  "

## 2024-09-07 ENCOUNTER — Ambulatory Visit (HOSPITAL_COMMUNITY)
Admission: RE | Admit: 2024-09-07 | Discharge: 2024-09-07 | Disposition: A | Discharge status: Home / Self Care | Source: Ambulatory Visit | Attending: Cardiology | Admitting: Cardiology

## 2024-09-07 ENCOUNTER — Encounter (HOSPITAL_COMMUNITY): Payer: Self-pay

## 2024-09-07 DIAGNOSIS — I2510 Atherosclerotic heart disease of native coronary artery without angina pectoris: Secondary | ICD-10-CM | POA: Diagnosis present

## 2024-09-07 DIAGNOSIS — I340 Nonrheumatic mitral (valve) insufficiency: Secondary | ICD-10-CM | POA: Insufficient documentation

## 2024-09-07 DIAGNOSIS — I349 Nonrheumatic mitral valve disorder, unspecified: Secondary | ICD-10-CM | POA: Diagnosis not present

## 2024-09-07 DIAGNOSIS — E7800 Pure hypercholesterolemia, unspecified: Secondary | ICD-10-CM | POA: Diagnosis present

## 2024-09-07 LAB — ECHOCARDIOGRAM COMPLETE
Area-P 1/2: 2.93 cm2
S' Lateral: 2.6 cm

## 2024-09-08 ENCOUNTER — Ambulatory Visit: Payer: Self-pay | Admitting: Cardiovascular Disease

## 2024-09-08 DIAGNOSIS — I421 Obstructive hypertrophic cardiomyopathy: Secondary | ICD-10-CM

## 2024-09-14 ENCOUNTER — Ambulatory Visit: Admitting: Behavioral Health

## 2024-09-21 ENCOUNTER — Other Ambulatory Visit: Payer: Self-pay | Admitting: Cardiovascular Disease

## 2024-09-21 ENCOUNTER — Ambulatory Visit: Admitting: Behavioral Health

## 2024-09-21 ENCOUNTER — Ambulatory Visit (HOSPITAL_COMMUNITY)
Admission: RE | Admit: 2024-09-21 | Discharge: 2024-09-21 | Disposition: A | Source: Ambulatory Visit | Attending: Cardiovascular Disease

## 2024-09-21 DIAGNOSIS — I421 Obstructive hypertrophic cardiomyopathy: Secondary | ICD-10-CM

## 2024-09-21 MED ORDER — GADOBUTROL 1 MMOL/ML IV SOLN
11.0000 mL | Freq: Once | INTRAVENOUS | Status: AC | PRN
  Administered 2024-09-21: 11 mL via INTRAVENOUS

## 2024-10-12 ENCOUNTER — Ambulatory Visit: Admitting: Behavioral Health

## 2024-11-07 ENCOUNTER — Ambulatory Visit: Admitting: Internal Medicine

## 2024-11-07 ENCOUNTER — Ambulatory Visit: Admitting: Physician Assistant

## 2025-09-04 ENCOUNTER — Ambulatory Visit
